# Patient Record
Sex: Female | Born: 1945 | Race: White | Hispanic: No | Marital: Married | Smoking: Former smoker
Health system: Southern US, Community
[De-identification: ages and names within clinical notes are randomized; demographics above are authoritative.]

## PROBLEM LIST (undated history)

## (undated) DIAGNOSIS — D649 Anemia, unspecified: Secondary | ICD-10-CM

## (undated) DIAGNOSIS — N840 Polyp of corpus uteri: Secondary | ICD-10-CM

## (undated) DIAGNOSIS — C801 Malignant (primary) neoplasm, unspecified: Secondary | ICD-10-CM

## (undated) DIAGNOSIS — I10 Essential (primary) hypertension: Secondary | ICD-10-CM

## (undated) DIAGNOSIS — C9200 Acute myeloblastic leukemia, not having achieved remission: Secondary | ICD-10-CM

## (undated) DIAGNOSIS — H269 Unspecified cataract: Secondary | ICD-10-CM

## (undated) DIAGNOSIS — T7840XA Allergy, unspecified, initial encounter: Secondary | ICD-10-CM

## (undated) DIAGNOSIS — M1990 Unspecified osteoarthritis, unspecified site: Secondary | ICD-10-CM

## (undated) DIAGNOSIS — M810 Age-related osteoporosis without current pathological fracture: Secondary | ICD-10-CM

## (undated) DIAGNOSIS — E785 Hyperlipidemia, unspecified: Secondary | ICD-10-CM

## (undated) HISTORY — PX: OTHER SURGICAL HISTORY: SHX169

## (undated) HISTORY — PX: DILATION AND CURETTAGE OF UTERUS: SHX78

## (undated) HISTORY — DX: Essential (primary) hypertension: I10

## (undated) HISTORY — DX: Unspecified osteoarthritis, unspecified site: M19.90

## (undated) HISTORY — DX: Anemia, unspecified: D64.9

## (undated) HISTORY — DX: Malignant (primary) neoplasm, unspecified: C80.1

## (undated) HISTORY — DX: Acute myeloblastic leukemia, not having achieved remission: C92.00

## (undated) HISTORY — PX: TONSILECTOMY, ADENOIDECTOMY, BILATERAL MYRINGOTOMY AND TUBES: SHX2538

## (undated) HISTORY — DX: Age-related osteoporosis without current pathological fracture: M81.0

## (undated) HISTORY — DX: Hyperlipidemia, unspecified: E78.5

## (undated) HISTORY — DX: Allergy, unspecified, initial encounter: T78.40XA

## (undated) HISTORY — DX: Polyp of corpus uteri: N84.0

## (undated) HISTORY — DX: Unspecified cataract: H26.9

---

## 2002-10-20 ENCOUNTER — Other Ambulatory Visit: Admission: RE | Admit: 2002-10-20 | Discharge: 2002-10-20 | Payer: Self-pay | Admitting: Family Medicine

## 2002-10-24 ENCOUNTER — Encounter: Payer: Self-pay | Admitting: Family Medicine

## 2002-10-24 ENCOUNTER — Encounter: Admission: RE | Admit: 2002-10-24 | Discharge: 2002-10-24 | Payer: Self-pay | Admitting: Family Medicine

## 2004-03-22 ENCOUNTER — Other Ambulatory Visit: Admission: RE | Admit: 2004-03-22 | Discharge: 2004-03-22 | Payer: Self-pay | Admitting: Family Medicine

## 2004-04-05 ENCOUNTER — Encounter: Admission: RE | Admit: 2004-04-05 | Discharge: 2004-04-05 | Payer: Self-pay | Admitting: Family Medicine

## 2004-09-13 ENCOUNTER — Ambulatory Visit: Payer: Self-pay | Admitting: Family Medicine

## 2004-12-30 ENCOUNTER — Ambulatory Visit: Payer: Self-pay | Admitting: Internal Medicine

## 2005-01-12 ENCOUNTER — Encounter: Admission: RE | Admit: 2005-01-12 | Discharge: 2005-01-12 | Payer: Self-pay | Admitting: Family Medicine

## 2005-02-12 ENCOUNTER — Ambulatory Visit (HOSPITAL_COMMUNITY): Admission: RE | Admit: 2005-02-12 | Discharge: 2005-02-13 | Payer: Self-pay | Admitting: Neurosurgery

## 2005-03-14 ENCOUNTER — Ambulatory Visit: Payer: Self-pay | Admitting: Family Medicine

## 2005-03-24 ENCOUNTER — Encounter: Payer: Self-pay | Admitting: Family Medicine

## 2005-03-24 ENCOUNTER — Other Ambulatory Visit: Admission: RE | Admit: 2005-03-24 | Discharge: 2005-03-24 | Payer: Self-pay | Admitting: Family Medicine

## 2005-03-24 ENCOUNTER — Ambulatory Visit: Payer: Self-pay | Admitting: Family Medicine

## 2005-04-11 ENCOUNTER — Encounter: Admission: RE | Admit: 2005-04-11 | Discharge: 2005-04-11 | Payer: Self-pay | Admitting: Family Medicine

## 2005-10-09 ENCOUNTER — Ambulatory Visit: Payer: Self-pay | Admitting: Family Medicine

## 2005-10-17 ENCOUNTER — Ambulatory Visit: Payer: Self-pay | Admitting: Family Medicine

## 2006-03-19 ENCOUNTER — Ambulatory Visit: Payer: Self-pay | Admitting: Family Medicine

## 2006-03-30 ENCOUNTER — Ambulatory Visit: Payer: Self-pay | Admitting: Family Medicine

## 2006-03-30 ENCOUNTER — Other Ambulatory Visit: Admission: RE | Admit: 2006-03-30 | Discharge: 2006-03-30 | Payer: Self-pay | Admitting: Family Medicine

## 2006-04-14 ENCOUNTER — Encounter: Admission: RE | Admit: 2006-04-14 | Discharge: 2006-04-14 | Payer: Self-pay | Admitting: Family Medicine

## 2006-05-05 ENCOUNTER — Encounter: Admission: RE | Admit: 2006-05-05 | Discharge: 2006-05-05 | Payer: Self-pay | Admitting: Family Medicine

## 2006-07-27 ENCOUNTER — Ambulatory Visit: Payer: Self-pay | Admitting: Family Medicine

## 2006-09-21 ENCOUNTER — Ambulatory Visit: Payer: Self-pay | Admitting: Family Medicine

## 2006-09-21 LAB — CONVERTED CEMR LAB
ALT: 23 units/L (ref 0–40)
AST: 20 units/L (ref 0–37)
Albumin: 3.8 g/dL (ref 3.5–5.2)
Alkaline Phosphatase: 49 units/L (ref 39–117)
Bilirubin, Direct: 0.2 mg/dL (ref 0.0–0.3)
Cholesterol: 184 mg/dL (ref 0–200)
HDL: 59.7 mg/dL (ref >39.0)
LDL Cholesterol: 105 mg/dL — ABNORMAL HIGH (ref 0–99)
TSH: 1.91 microintl units/mL (ref 0.35–5.50)
Total Bilirubin: 1 mg/dL (ref 0.3–1.2)
Total CHOL/HDL Ratio: 3.1
Total Protein: 6.7 g/dL (ref 6.0–8.3)
Triglycerides: 99 mg/dL (ref 0–149)
VLDL: 20 mg/dL (ref 0–40)

## 2006-09-30 ENCOUNTER — Ambulatory Visit: Payer: Self-pay | Admitting: Family Medicine

## 2006-11-04 ENCOUNTER — Encounter: Admission: RE | Admit: 2006-11-04 | Discharge: 2006-11-04 | Payer: Self-pay | Admitting: Family Medicine

## 2006-11-12 ENCOUNTER — Ambulatory Visit: Payer: Self-pay | Admitting: Family Medicine

## 2007-03-26 ENCOUNTER — Ambulatory Visit: Payer: Self-pay | Admitting: Family Medicine

## 2007-03-26 LAB — CONVERTED CEMR LAB
Bilirubin Urine: NEGATIVE
Blood in Urine, dipstick: NEGATIVE
Glucose, Urine, Semiquant: NEGATIVE
Ketones, urine, test strip: NEGATIVE
Nitrite: NEGATIVE
Protein, U semiquant: NEGATIVE
Specific Gravity, Urine: 1.02
Urobilinogen, UA: 0.2
WBC Urine, dipstick: NEGATIVE
pH: 6

## 2007-03-30 LAB — CONVERTED CEMR LAB
ALT: 26 units/L (ref 0–35)
AST: 18 units/L (ref 0–37)
Albumin: 3.6 g/dL (ref 3.5–5.2)
Alkaline Phosphatase: 55 units/L (ref 39–117)
BUN: 12 mg/dL (ref 6–23)
Basophils Absolute: 0 10*3/uL (ref 0.0–0.1)
Basophils Relative: 0.7 % (ref 0.0–1.0)
Bilirubin, Direct: 0.1 mg/dL (ref 0.0–0.3)
CO2: 31 meq/L (ref 19–32)
Calcium: 9.2 mg/dL (ref 8.4–10.5)
Chloride: 107 meq/L (ref 96–112)
Cholesterol: 180 mg/dL (ref 0–200)
Creatinine, Ser: 0.8 mg/dL (ref 0.4–1.2)
Eosinophils Absolute: 0.2 10*3/uL (ref 0.0–0.6)
Eosinophils Relative: 2.4 % (ref 0.0–5.0)
GFR calc Af Amer: 93 mL/min
GFR calc non Af Amer: 77 mL/min
Glucose, Bld: 93 mg/dL (ref 70–99)
HCT: 36.2 % (ref 36.0–46.0)
HDL: 58.8 mg/dL (ref >39.0)
Hemoglobin: 12.3 g/dL (ref 12.0–15.0)
LDL Cholesterol: 98 mg/dL (ref 0–99)
Lymphocytes Relative: 27.7 % (ref 12.0–46.0)
MCHC: 34 g/dL (ref 30.0–36.0)
MCV: 82.1 fL (ref 78.0–100.0)
Monocytes Absolute: 0.6 10*3/uL (ref 0.2–0.7)
Monocytes Relative: 8.3 % (ref 3.0–11.0)
Neutro Abs: 4.1 10*3/uL (ref 1.4–7.7)
Neutrophils Relative %: 60.9 % (ref 43.0–77.0)
Platelets: 174 10*3/uL (ref 150–400)
Potassium: 4.3 meq/L (ref 3.5–5.1)
RBC: 4.41 M/uL (ref 3.87–5.11)
RDW: 12.1 % (ref 11.5–14.6)
Sodium: 146 meq/L — ABNORMAL HIGH (ref 135–145)
TSH: 2.39 microintl units/mL (ref 0.35–5.50)
Total Bilirubin: 1.2 mg/dL (ref 0.3–1.2)
Total CHOL/HDL Ratio: 3.1
Total Protein: 6.6 g/dL (ref 6.0–8.3)
Triglycerides: 117 mg/dL (ref 0–149)
VLDL: 23 mg/dL (ref 0–40)
WBC: 6.8 10*3/uL (ref 4.5–10.5)

## 2007-04-06 ENCOUNTER — Encounter: Payer: Self-pay | Admitting: Family Medicine

## 2007-04-06 ENCOUNTER — Other Ambulatory Visit: Admission: RE | Admit: 2007-04-06 | Discharge: 2007-04-06 | Payer: Self-pay | Admitting: Family Medicine

## 2007-04-06 ENCOUNTER — Telehealth: Payer: Self-pay | Admitting: Family Medicine

## 2007-04-06 ENCOUNTER — Ambulatory Visit: Payer: Self-pay | Admitting: Family Medicine

## 2007-04-06 DIAGNOSIS — D649 Anemia, unspecified: Secondary | ICD-10-CM

## 2007-04-06 DIAGNOSIS — E039 Hypothyroidism, unspecified: Secondary | ICD-10-CM | POA: Insufficient documentation

## 2007-04-06 DIAGNOSIS — J309 Allergic rhinitis, unspecified: Secondary | ICD-10-CM | POA: Insufficient documentation

## 2007-04-06 DIAGNOSIS — E785 Hyperlipidemia, unspecified: Secondary | ICD-10-CM | POA: Insufficient documentation

## 2007-04-06 DIAGNOSIS — Z87442 Personal history of urinary calculi: Secondary | ICD-10-CM | POA: Insufficient documentation

## 2007-04-06 DIAGNOSIS — M1990 Unspecified osteoarthritis, unspecified site: Secondary | ICD-10-CM | POA: Insufficient documentation

## 2007-04-09 ENCOUNTER — Telehealth: Payer: Self-pay | Admitting: Family Medicine

## 2007-05-03 ENCOUNTER — Encounter: Admission: RE | Admit: 2007-05-03 | Discharge: 2007-05-03 | Payer: Self-pay | Admitting: Family Medicine

## 2007-09-23 ENCOUNTER — Ambulatory Visit: Payer: Self-pay | Admitting: Family Medicine

## 2007-09-24 LAB — CONVERTED CEMR LAB
ALT: 24 units/L (ref 0–35)
AST: 19 units/L (ref 0–37)
Albumin: 3.6 g/dL (ref 3.5–5.2)
Alkaline Phosphatase: 48 units/L (ref 39–117)
Bilirubin, Direct: 0.1 mg/dL (ref 0.0–0.3)
Cholesterol: 174 mg/dL (ref 0–200)
HDL: 58.6 mg/dL (ref >39.0)
LDL Cholesterol: 96 mg/dL (ref 0–99)
Total Bilirubin: 0.8 mg/dL (ref 0.3–1.2)
Total CHOL/HDL Ratio: 3
Total Protein: 6.7 g/dL (ref 6.0–8.3)
Triglycerides: 99 mg/dL (ref 0–149)
VLDL: 20 mg/dL (ref 0–40)

## 2008-01-24 ENCOUNTER — Telehealth: Payer: Self-pay | Admitting: Family Medicine

## 2008-01-31 ENCOUNTER — Ambulatory Visit: Payer: Self-pay | Admitting: Gastroenterology

## 2008-02-02 ENCOUNTER — Telehealth: Payer: Self-pay | Admitting: Gastroenterology

## 2008-02-14 ENCOUNTER — Ambulatory Visit: Payer: Self-pay | Admitting: Gastroenterology

## 2008-02-14 ENCOUNTER — Encounter: Payer: Self-pay | Admitting: Gastroenterology

## 2008-02-16 ENCOUNTER — Encounter: Payer: Self-pay | Admitting: Gastroenterology

## 2008-04-10 ENCOUNTER — Ambulatory Visit: Payer: Self-pay | Admitting: Family Medicine

## 2008-04-12 LAB — CONVERTED CEMR LAB
ALT: 26 units/L (ref 0–35)
AST: 20 units/L (ref 0–37)
Albumin: 3.6 g/dL (ref 3.5–5.2)
Alkaline Phosphatase: 50 units/L (ref 39–117)
BUN: 16 mg/dL (ref 6–23)
Basophils Absolute: 0 10*3/uL (ref 0.0–0.1)
Basophils Relative: 0.4 % (ref 0.0–3.0)
Bilirubin, Direct: 0.1 mg/dL (ref 0.0–0.3)
CO2: 30 meq/L (ref 19–32)
Calcium: 9 mg/dL (ref 8.4–10.5)
Chloride: 107 meq/L (ref 96–112)
Cholesterol: 155 mg/dL (ref 0–200)
Creatinine, Ser: 0.8 mg/dL (ref 0.4–1.2)
Eosinophils Absolute: 0.2 10*3/uL (ref 0.0–0.7)
Eosinophils Relative: 2.7 % (ref 0.0–5.0)
GFR calc Af Amer: 93 mL/min
GFR calc non Af Amer: 77 mL/min
Glucose, Bld: 103 mg/dL — ABNORMAL HIGH (ref 70–99)
HCT: 38.6 % (ref 36.0–46.0)
HDL: 56.7 mg/dL (ref >39.0)
Hemoglobin: 12.9 g/dL (ref 12.0–15.0)
LDL Cholesterol: 80 mg/dL (ref 0–99)
Lymphocytes Relative: 28.2 % (ref 12.0–46.0)
MCHC: 33.5 g/dL (ref 30.0–36.0)
MCV: 83.6 fL (ref 78.0–100.0)
Monocytes Absolute: 0.4 10*3/uL (ref 0.1–1.0)
Monocytes Relative: 6.7 % (ref 3.0–12.0)
Neutro Abs: 4.1 10*3/uL (ref 1.4–7.7)
Neutrophils Relative %: 62 % (ref 43.0–77.0)
Platelets: 179 10*3/uL (ref 150–400)
Potassium: 4.4 meq/L (ref 3.5–5.1)
RBC: 4.62 M/uL (ref 3.87–5.11)
RDW: 11.8 % (ref 11.5–14.6)
Sodium: 144 meq/L (ref 135–145)
TSH: 2.67 microintl units/mL (ref 0.35–5.50)
Total Bilirubin: 0.9 mg/dL (ref 0.3–1.2)
Total CHOL/HDL Ratio: 2.7
Total Protein: 6.9 g/dL (ref 6.0–8.3)
Triglycerides: 92 mg/dL (ref 0–149)
VLDL: 18 mg/dL (ref 0–40)
WBC: 6.5 10*3/uL (ref 4.5–10.5)

## 2008-04-17 ENCOUNTER — Ambulatory Visit: Payer: Self-pay | Admitting: Family Medicine

## 2008-04-17 DIAGNOSIS — M810 Age-related osteoporosis without current pathological fracture: Secondary | ICD-10-CM | POA: Insufficient documentation

## 2008-05-03 ENCOUNTER — Encounter: Admission: RE | Admit: 2008-05-03 | Discharge: 2008-05-03 | Payer: Self-pay | Admitting: Family Medicine

## 2008-08-15 ENCOUNTER — Encounter: Payer: Self-pay | Admitting: Family Medicine

## 2008-08-15 ENCOUNTER — Ambulatory Visit: Payer: Self-pay | Admitting: Internal Medicine

## 2008-08-29 ENCOUNTER — Telehealth: Payer: Self-pay | Admitting: Family Medicine

## 2008-10-17 ENCOUNTER — Ambulatory Visit: Payer: Self-pay | Admitting: Family Medicine

## 2008-10-18 ENCOUNTER — Encounter: Payer: Self-pay | Admitting: Family Medicine

## 2008-10-18 LAB — CONVERTED CEMR LAB
ALT: 25 units/L (ref 0–35)
AST: 22 units/L (ref 0–37)
Albumin: 3.5 g/dL (ref 3.5–5.2)
Alkaline Phosphatase: 50 units/L (ref 39–117)
Bilirubin, Direct: 0.1 mg/dL (ref 0.0–0.3)
Cholesterol: 170 mg/dL (ref 0–200)
HDL: 51.8 mg/dL (ref >39.00)
LDL Cholesterol: 96 mg/dL (ref 0–99)
Total Bilirubin: 0.9 mg/dL (ref 0.3–1.2)
Total CHOL/HDL Ratio: 3
Total Protein: 6.6 g/dL (ref 6.0–8.3)
Triglycerides: 112 mg/dL (ref 0.0–149.0)
VLDL: 22.4 mg/dL (ref 0.0–40.0)

## 2008-10-24 ENCOUNTER — Ambulatory Visit: Payer: Self-pay | Admitting: Family Medicine

## 2009-02-02 ENCOUNTER — Ambulatory Visit: Payer: Self-pay | Admitting: Family Medicine

## 2009-02-02 DIAGNOSIS — N950 Postmenopausal bleeding: Secondary | ICD-10-CM

## 2009-02-06 ENCOUNTER — Encounter: Payer: Self-pay | Admitting: Family Medicine

## 2009-03-08 ENCOUNTER — Encounter: Payer: Self-pay | Admitting: Family Medicine

## 2009-03-08 ENCOUNTER — Encounter (INDEPENDENT_AMBULATORY_CARE_PROVIDER_SITE_OTHER): Payer: Self-pay | Admitting: Obstetrics and Gynecology

## 2009-03-08 ENCOUNTER — Ambulatory Visit (HOSPITAL_BASED_OUTPATIENT_CLINIC_OR_DEPARTMENT_OTHER): Admission: RE | Admit: 2009-03-08 | Discharge: 2009-03-08 | Payer: Self-pay | Admitting: Obstetrics and Gynecology

## 2009-04-03 ENCOUNTER — Ambulatory Visit: Payer: Self-pay | Admitting: Family Medicine

## 2009-04-03 LAB — CONVERTED CEMR LAB
Bilirubin Urine: NEGATIVE
Blood in Urine, dipstick: NEGATIVE
Glucose, Urine, Semiquant: NEGATIVE
Ketones, urine, test strip: NEGATIVE
Nitrite: NEGATIVE
Protein, U semiquant: NEGATIVE
Specific Gravity, Urine: 1.02
Urobilinogen, UA: 0.2
WBC Urine, dipstick: NEGATIVE
pH: 5

## 2009-04-04 LAB — CONVERTED CEMR LAB
ALT: 25 units/L (ref 0–35)
AST: 20 units/L (ref 0–37)
Albumin: 3.6 g/dL (ref 3.5–5.2)
Alkaline Phosphatase: 56 units/L (ref 39–117)
BUN: 12 mg/dL (ref 6–23)
Basophils Absolute: 0 10*3/uL (ref 0.0–0.1)
Basophils Relative: 0.6 % (ref 0.0–3.0)
Bilirubin, Direct: 0 mg/dL (ref 0.0–0.3)
CO2: 30 meq/L (ref 19–32)
Calcium: 9 mg/dL (ref 8.4–10.5)
Chloride: 111 meq/L (ref 96–112)
Cholesterol: 164 mg/dL (ref 0–200)
Creatinine, Ser: 0.8 mg/dL (ref 0.4–1.2)
Eosinophils Absolute: 0.1 10*3/uL (ref 0.0–0.7)
Eosinophils Relative: 2 % (ref 0.0–5.0)
GFR calc non Af Amer: 76.52 mL/min (ref >60)
Glucose, Bld: 107 mg/dL — ABNORMAL HIGH (ref 70–99)
HCT: 38.5 % (ref 36.0–46.0)
HDL: 50.9 mg/dL (ref >39.00)
Hemoglobin: 12.7 g/dL (ref 12.0–15.0)
LDL Cholesterol: 89 mg/dL (ref 0–99)
Lymphocytes Relative: 27.9 % (ref 12.0–46.0)
Lymphs Abs: 1.7 10*3/uL (ref 0.7–4.0)
MCHC: 33 g/dL (ref 30.0–36.0)
MCV: 84.5 fL (ref 78.0–100.0)
Monocytes Absolute: 0.5 10*3/uL (ref 0.1–1.0)
Monocytes Relative: 8.6 % (ref 3.0–12.0)
Neutro Abs: 3.8 10*3/uL (ref 1.4–7.7)
Neutrophils Relative %: 60.9 % (ref 43.0–77.0)
Platelets: 162 10*3/uL (ref 150.0–400.0)
Potassium: 4.3 meq/L (ref 3.5–5.1)
RBC: 4.55 M/uL (ref 3.87–5.11)
RDW: 12.3 % (ref 11.5–14.6)
Sodium: 144 meq/L (ref 135–145)
TSH: 1.35 microintl units/mL (ref 0.35–5.50)
Total Bilirubin: 0.9 mg/dL (ref 0.3–1.2)
Total CHOL/HDL Ratio: 3
Total Protein: 6.7 g/dL (ref 6.0–8.3)
Triglycerides: 123 mg/dL (ref 0.0–149.0)
VLDL: 24.6 mg/dL (ref 0.0–40.0)
WBC: 6.1 10*3/uL (ref 4.5–10.5)

## 2009-04-10 ENCOUNTER — Ambulatory Visit: Payer: Self-pay | Admitting: Family Medicine

## 2009-05-04 ENCOUNTER — Encounter: Admission: RE | Admit: 2009-05-04 | Discharge: 2009-05-04 | Payer: Self-pay | Admitting: Family Medicine

## 2009-07-12 ENCOUNTER — Ambulatory Visit: Payer: Self-pay | Admitting: Family Medicine

## 2009-07-13 ENCOUNTER — Telehealth: Payer: Self-pay | Admitting: Family Medicine

## 2009-10-29 ENCOUNTER — Ambulatory Visit: Payer: Self-pay | Admitting: Family Medicine

## 2009-10-31 LAB — CONVERTED CEMR LAB
ALT: 22 units/L (ref 0–35)
AST: 17 units/L (ref 0–37)
Albumin: 3.5 g/dL (ref 3.5–5.2)
Alkaline Phosphatase: 47 units/L (ref 39–117)
Bilirubin, Direct: 0 mg/dL (ref 0.0–0.3)
Cholesterol: 149 mg/dL (ref 0–200)
HDL: 56.1 mg/dL (ref >39.00)
LDL Cholesterol: 73 mg/dL (ref 0–99)
Total Bilirubin: 0.5 mg/dL (ref 0.3–1.2)
Total CHOL/HDL Ratio: 3
Total Protein: 6.7 g/dL (ref 6.0–8.3)
Triglycerides: 99 mg/dL (ref 0.0–149.0)
VLDL: 19.8 mg/dL (ref 0.0–40.0)

## 2009-11-05 ENCOUNTER — Ambulatory Visit: Payer: Self-pay | Admitting: Family Medicine

## 2010-05-06 ENCOUNTER — Encounter: Admission: RE | Admit: 2010-05-06 | Discharge: 2010-05-06 | Payer: Self-pay | Admitting: Family Medicine

## 2010-05-20 ENCOUNTER — Ambulatory Visit: Payer: Self-pay | Admitting: Family Medicine

## 2010-05-20 ENCOUNTER — Encounter: Payer: Self-pay | Admitting: Family Medicine

## 2010-05-21 LAB — CONVERTED CEMR LAB
ALT: 25 units/L (ref 0–35)
AST: 20 units/L (ref 0–37)
Albumin: 3.8 g/dL (ref 3.5–5.2)
Alkaline Phosphatase: 52 units/L (ref 39–117)
BUN: 15 mg/dL (ref 6–23)
Basophils Absolute: 0 10*3/uL (ref 0.0–0.1)
Basophils Relative: 0.8 % (ref 0.0–3.0)
Bilirubin, Direct: 0.1 mg/dL (ref 0.0–0.3)
CO2: 29 meq/L (ref 19–32)
Calcium: 8.9 mg/dL (ref 8.4–10.5)
Chloride: 103 meq/L (ref 96–112)
Cholesterol: 198 mg/dL (ref 0–200)
Creatinine, Ser: 0.7 mg/dL (ref 0.4–1.2)
Eosinophils Absolute: 0.1 10*3/uL (ref 0.0–0.7)
Eosinophils Relative: 1.9 % (ref 0.0–5.0)
GFR calc non Af Amer: 83.43 mL/min (ref >60)
Glucose, Bld: 113 mg/dL — ABNORMAL HIGH (ref 70–99)
HCT: 37.6 % (ref 36.0–46.0)
HDL: 62.3 mg/dL (ref >39.00)
Hemoglobin: 12.8 g/dL (ref 12.0–15.0)
LDL Cholesterol: 112 mg/dL — ABNORMAL HIGH (ref 0–99)
Lymphocytes Relative: 25.1 % (ref 12.0–46.0)
Lymphs Abs: 1.4 10*3/uL (ref 0.7–4.0)
MCHC: 34 g/dL (ref 30.0–36.0)
MCV: 83.9 fL (ref 78.0–100.0)
Monocytes Absolute: 0.5 10*3/uL (ref 0.1–1.0)
Monocytes Relative: 8.5 % (ref 3.0–12.0)
Neutro Abs: 3.5 10*3/uL (ref 1.4–7.7)
Neutrophils Relative %: 63.7 % (ref 43.0–77.0)
Platelets: 164 10*3/uL (ref 150.0–400.0)
Potassium: 4.7 meq/L (ref 3.5–5.1)
RBC: 4.49 M/uL (ref 3.87–5.11)
RDW: 12.9 % (ref 11.5–14.6)
Sodium: 139 meq/L (ref 135–145)
TSH: 1.46 microintl units/mL (ref 0.35–5.50)
Total Bilirubin: 1.1 mg/dL (ref 0.3–1.2)
Total CHOL/HDL Ratio: 3
Total Protein: 6.6 g/dL (ref 6.0–8.3)
Triglycerides: 119 mg/dL (ref 0.0–149.0)
VLDL: 23.8 mg/dL (ref 0.0–40.0)
WBC: 5.5 10*3/uL (ref 4.5–10.5)

## 2010-07-27 ENCOUNTER — Encounter: Payer: Self-pay | Admitting: Family Medicine

## 2010-08-04 LAB — CONVERTED CEMR LAB: Pap Smear: NORMAL

## 2010-08-06 NOTE — Progress Notes (Signed)
Summary: labs needed?  Phone Note Call from Patient Call back at Work Phone [phone]   Caller: Patient-live call Summary of Call: patient was seen yesterday and was told to follow up in 3 months. She stated that she will need labs. Which ones? Initial call taken by: Warnell Forester,  July 13, 2009 9:40 AM  Follow-up for Phone Call        lipids and liver for 272.4 Follow-up by: Nelwyn Salisbury MD,  July 13, 2009 10:33 AM  Additional Follow-up for Phone Call Additional follow up Details #1::        patient is aware of above. Appt has been made. Additional Follow-up by: Warnell Forester,  July 13, 2009 2:07 PM

## 2010-08-06 NOTE — Assessment & Plan Note (Signed)
Summary: emp--will fast///ccm   Vital Signs:  Patient profile:   65 year old female Height:      60 inches Weight:      162 pounds O2 Sat:      94 % Temp:     98.2 degrees F Pulse rate:   96 / minute BP sitting:   150 / 84  (left arm)  Vitals Entered By: Pura Spice, RN (May 20, 2010 9:18 AM)  Contraindications/Deferment of Procedures/Staging:    Test/Procedure: TD vaccine    Reason for deferment: declined  CC: cpx fasting. Had Pap last yr with Dr Rosalio Macadamia.    History of Present Illness: 65 yr old female for a cpx. She feels well and has no concerns. After the first of the year she will go to part time on her job. She hopes to spend some time with her grandchildren.   Allergies: 1)  ! Penicillin 2)  ! Jonne Ply  Past History:  Past Medical History: Reviewed history from 04/10/2009 and no changes required. Allergic rhinitis Anemia-NOS Hyperlipidemia Hypothyroidism Osteoarthritis Osteoporosis, last DEXA 08-15-08 post-menopausal bleeding from a benign endometrial polyp  Past Surgical History: Reviewed history from 04/10/2009 and no changes required. Tonsillectomy Herniated disc L4-5 per Dr. Wynetta Emery colonoscopy 02-14-08 per Dr. Christella Hartigan, repeat 5 yrs endometrial polypectomy with D and C 03-08-09 per Dr. Rosalio Macadamia  Past History:  Care Management: Ophthalmology: Dr Elmer Picker Gastroenterology: [facility]   Family History: Reviewed history from 04/17/2008 and no changes required. Family History of Stroke M 1st degree relative <50  Social History: Reviewed history from 04/17/2008 and no changes required. Married Never Smoked Alcohol use-no Drug use-no  Review of Systems  The patient denies anorexia, fever, weight loss, weight gain, vision loss, decreased hearing, hoarseness, chest pain, syncope, dyspnea on exertion, peripheral edema, prolonged cough, headaches, hemoptysis, abdominal pain, melena, hematochezia, severe indigestion/heartburn, hematuria, incontinence,  genital sores, muscle weakness, suspicious skin lesions, transient blindness, difficulty walking, depression, unusual weight change, abnormal bleeding, enlarged lymph nodes, angioedema, breast masses, and testicular masses.    Physical Exam  General:  overweight-appearing.   Head:  Normocephalic and atraumatic without obvious abnormalities. No apparent alopecia or balding. Eyes:  No corneal or conjunctival inflammation noted. EOMI. Perrla. Funduscopic exam benign, without hemorrhages, exudates or papilledema. Vision grossly normal. Ears:  External ear exam shows no significant lesions or deformities.  Otoscopic examination reveals clear canals, tympanic membranes are intact bilaterally without bulging, retraction, inflammation or discharge. Hearing is grossly normal bilaterally. Nose:  External nasal examination shows no deformity or inflammation. Nasal mucosa are pink and moist without lesions or exudates. Mouth:  Oral mucosa and oropharynx without lesions or exudates.  Teeth in good repair. Neck:  No deformities, masses, or tenderness noted. Chest Wall:  No deformities, masses, or tenderness noted. Lungs:  Normal respiratory effort, chest expands symmetrically. Lungs are clear to auscultation, no crackles or wheezes. Heart:  Normal rate and regular rhythm. S1 and S2 normal without gallop, murmur, click, rub or other extra sounds. EKG normal with a few PACs  Abdomen:  Bowel sounds positive,abdomen soft and non-tender without masses, organomegaly or hernias noted. Msk:  No deformity or scoliosis noted of thoracic or lumbar spine.   Pulses:  R and L carotid,radial,femoral,dorsalis pedis and posterior tibial pulses are full and equal bilaterally Extremities:  No clubbing, cyanosis, edema, or deformity noted with normal full range of motion of all joints.   Neurologic:  No cranial nerve deficits noted. Station and gait are normal. Plantar  reflexes are down-going bilaterally. DTRs are symmetrical  throughout. Sensory, motor and coordinative functions appear intact. Skin:  Intact without suspicious lesions or rashes Cervical Nodes:  No lymphadenopathy noted Axillary Nodes:  No palpable lymphadenopathy Inguinal Nodes:  No significant adenopathy Psych:  Cognition and judgment appear intact. Alert and cooperative with normal attention span and concentration. No apparent delusions, illusions, hallucinations   Impression & Recommendations:  Problem # 1:  EXAMINATION, ROUTINE MEDICAL (ICD-V70.0)  Orders: UA Dipstick w/o Micro (automated)  (81003) EKG w/ Interpretation (93000) Venipuncture (64403) TLB-Lipid Panel (80061-LIPID) TLB-BMP (Basic Metabolic Panel-BMET) (80048-METABOL) TLB-CBC Platelet - w/Differential (85025-CBCD) TLB-Hepatic/Liver Function Pnl (80076-HEPATIC) TLB-TSH (Thyroid Stimulating Hormone) (84443-TSH)  Complete Medication List: 1)  Nabumetone 750 Mg Tabs (Nabumetone) .Marland Kitchen.. 1 by mouth two times a day 2)  Levoxyl 50 Mcg Tabs (Levothyroxine sodium) .Marland Kitchen.. 1 by mouth once daily 3)  Fosamax 35 Mg Tabs (Alendronate sodium) .Marland Kitchen.. 1 by mouth every week 4)  Lipitor 20 Mg Tabs (Atorvastatin calcium) .Marland Kitchen.. 1 by mouth once daily 5)  Multivitamins Tabs (Multiple vitamin) .Marland Kitchen.. 1 by mouth once daily  Other Orders: Pneumococcal Vaccine (47425) Admin 1st Vaccine (95638)  Patient Instructions: 1)  It is important that you exercise reguarly at least 20 minutes 5 times a week. If you develop chest pain, have severe difficulty breathing, or feel very tired, stop exercising immediately and seek medical attention.  2)  You need to lose weight. Consider a lower calorie diet and regular exercise.  3)  get fasting labs today Prescriptions: LIPITOR 20 MG  TABS (ATORVASTATIN CALCIUM) 1 by mouth once daily  #90 x 3   Entered and Authorized by:   Nelwyn Salisbury MD   Signed by:   Nelwyn Salisbury MD on 05/20/2010   Method used:   Electronically to        [facility]*  (retail)       [address] [place], [place]  [address]       Ph: [phone]       Fax: [phone]   RxID:   [id number] LEVOXYL 50 MCG TABS (LEVOTHYROXINE SODIUM) 1 by mouth once daily  #90 x 3   Entered and Authorized by:   Nelwyn Salisbury MD   Signed by:   Nelwyn Salisbury MD on 05/20/2010   Method used:   Electronically to        [facility]* (retail)       [address] [place], [place]  [address]       Ph: [phone]       Fax: [phone]   RxID:   [id number] NABUMETONE 750 MG  TABS (NABUMETONE) 1 by mouth two times a day  #180 x 3   Entered and Authorized by:   Nelwyn Salisbury MD   Signed by:   Nelwyn Salisbury MD on 05/20/2010   Method used:   Electronically to        [facility]* (retail)       [address] [place], [place]  [address]       Ph: [phone]       Fax: [phone]   RxID:   [id number]    Orders Added:  1)  Pneumococcal Vaccine [90732] 2)  Admin 1st Vaccine [90471] 3)  Est. Patient 65& > [99397] 4)  UA Dipstick w/o Micro (automated)  [81003] 5)  EKG w/ Interpretation [93000] 6)  Venipuncture [36415] 7)  TLB-Lipid Panel [80061-LIPID] 8)  TLB-BMP (Basic Metabolic Panel-BMET) [80048-METABOL] 9)  TLB-CBC Platelet - w/Differential [85025-CBCD] 10)  TLB-Hepatic/Liver Function Pnl [80076-HEPATIC] 11)  TLB-TSH (Thyroid Stimulating Hormone) [45409-WJX]   Immunizations Administered:  Pneumonia Vaccine:    Vaccine Type: Pneumovax    Site: left deltoid    Mfr: Merck    Dose: 0.5 ml    Route: IM    Given by: Pura Spice, RN    Exp. Date: 10/29/2011    Lot #: [id number]    VIS given: 06/11/09 version given May 20, 2010.   Immunizations Administered:  Pneumonia Vaccine:    Vaccine Type: Pneumovax    Site: left deltoid    Mfr: Merck    Dose: 0.5 ml    Route: IM    Given by: Pura Spice, RN     Exp. Date: 10/29/2011    Lot #: [id number]    VIS given: 06/11/09 version given May 20, 2010.  Appended Document: Orders Update    Clinical Lists Changes  Orders: Added new Service order of Specimen Handling (91478) - Signed      Appended Document: emp--will fast///ccm  Laboratory Results   Urine Tests    Routine Urinalysis   Color: yellow Appearance: Clear Glucose: negative   (Normal Range: Negative) Bilirubin: negative   (Normal Range: Negative) Ketone: negative   (Normal Range: Negative) Spec. Gravity: 1.025   (Normal Range: 1.003-1.035) Blood: negative   (Normal Range: Negative) pH: 5.0   (Normal Range: 5.0-8.0) Protein: negative   (Normal Range: Negative) Urobilinogen: 0.2   (Normal Range: 0-1) Nitrite: negative   (Normal Range: Negative) Leukocyte Esterace: negative   (Normal Range: Negative)    Comments: Rita Ohara  May 20, 2010 2:06 PM

## 2010-08-06 NOTE — Assessment & Plan Note (Signed)
Summary: 3 month fup//ccm   Vital Signs:  Patient profile:   65 year old female Weight:      159 pounds BMI:     30.65 BP sitting:   138 / 68  (left arm) Cuff size:   regular  Vitals Entered By: Raechel Ache, RN (Nov 05, 2009 3:31 PM) CC: 3 mo ROV, c/o sinus headaches and frequent sinus infections.   History of Present Illness: Here to follow up on hyperlipidemia and a hx of elevated BPs.  She recently had a lipid panel showing excellent results. She feels good with no concerns.   Allergies: 1)  ! Penicillin 2)  ! Jonne Ply  Past History:  Past Medical History: Reviewed history from 04/10/2009 and no changes required. Allergic rhinitis Anemia-NOS Hyperlipidemia Hypothyroidism Osteoarthritis Osteoporosis, last DEXA 08-15-08 post-menopausal bleeding from a benign endometrial polyp  Review of Systems  The patient denies anorexia, fever, weight loss, weight gain, vision loss, decreased hearing, hoarseness, chest pain, syncope, dyspnea on exertion, peripheral edema, prolonged cough, headaches, hemoptysis, abdominal pain, melena, hematochezia, severe indigestion/heartburn, hematuria, incontinence, genital sores, muscle weakness, suspicious skin lesions, transient blindness, difficulty walking, depression, unusual weight change, abnormal bleeding, enlarged lymph nodes, angioedema, breast masses, and testicular masses.    Physical Exam  General:  Well-developed,well-nourished,in no acute distress; alert,appropriate and cooperative throughout examination Neck:  No deformities, masses, or tenderness noted. Lungs:  Normal respiratory effort, chest expands symmetrically. Lungs are clear to auscultation, no crackles or wheezes. Heart:  Normal rate and regular rhythm. S1 and S2 normal without gallop, murmur, click, rub or other extra sounds.   Impression & Recommendations:  Problem # 1:  ELEVATED BLOOD PRESSURE (ICD-796.2)  Problem # 2:  HYPERLIPIDEMIA (ICD-272.4)  Her updated  medication list for this problem includes:    Lipitor 20 Mg Tabs (Atorvastatin calcium) .Marland Kitchen... 1 by mouth once daily  Complete Medication List: 1)  Nabumetone 750 Mg Tabs (Nabumetone) .Marland Kitchen.. 1 by mouth two times a day 2)  Levoxyl 50 Mcg Tabs (Levothyroxine sodium) .Marland Kitchen.. 1 by mouth once daily 3)  Fosamax 35 Mg Tabs (Alendronate sodium) .Marland Kitchen.. 1 by mouth every week 4)  Lipitor 20 Mg Tabs (Atorvastatin calcium) .Marland Kitchen.. 1 by mouth once daily 5)  Multivitamins Tabs (Multiple vitamin) .Marland Kitchen.. 1 by mouth once daily  Patient Instructions: 1)  Please schedule a follow-up appointment in 6 months .

## 2010-08-06 NOTE — Assessment & Plan Note (Signed)
Summary: ROA/BP/FUP/RCD   Vital Signs:  Patient profile:   65 year old female Weight:      159 pounds Temp:     98.1 degrees F oral Pulse rate:   112 / minute BP sitting:   142 / 76  (left arm) Cuff size:   regular  Vitals Entered By: Alfred Levins, CMA (July 12, 2009 8:27 AM) CC: bp check   History of Present Illness: Here to recheck her BP after it was high at her cpx 3 months ago. She has joined [employer], and has lost 10 lbs. She is walking for exercise again. She feels fine.  Current Medications (verified): 1)  Nabumetone 750 Mg  Tabs (Nabumetone) .Marland Kitchen.. 1 By Mouth Two Times A Day 2)  Levoxyl 50 Mcg Tabs (Levothyroxine Sodium) .Marland Kitchen.. 1 By Mouth Once Daily 3)  Fosamax 35 Mg  Tabs (Alendronate Sodium) .Marland Kitchen.. 1 By Mouth Every Week 4)  Lipitor 20 Mg  Tabs (Atorvastatin Calcium) .Marland Kitchen.. 1 By Mouth Once Daily 5)  Multivitamins   Tabs (Multiple Vitamin) .Marland Kitchen.. 1 By Mouth Once Daily  Allergies (verified): 1)  ! Penicillin 2)  ! Jonne Ply  Past History:  Past Medical History: Reviewed history from 04/10/2009 and no changes required. Allergic rhinitis Anemia-NOS Hyperlipidemia Hypothyroidism Osteoarthritis Osteoporosis, last DEXA 08-15-08 post-menopausal bleeding from a benign endometrial polyp  Review of Systems  The patient denies anorexia, fever, weight gain, vision loss, decreased hearing, hoarseness, chest pain, syncope, dyspnea on exertion, peripheral edema, prolonged cough, headaches, hemoptysis, abdominal pain, melena, hematochezia, severe indigestion/heartburn, hematuria, incontinence, genital sores, muscle weakness, suspicious skin lesions, transient blindness, difficulty walking, depression, unusual weight change, abnormal bleeding, enlarged lymph nodes, angioedema, breast masses, and testicular masses.    Physical Exam  General:  Well-developed,well-nourished,in no acute distress; alert,appropriate and cooperative throughout examination Lungs:  Normal respiratory effort,  chest expands symmetrically. Lungs are clear to auscultation, no crackles or wheezes. Heart:  Normal rate and regular rhythm. S1 and S2 normal without gallop, murmur, click, rub or other extra sounds.   Impression & Recommendations:  Problem # 1:  ELEVATED BLOOD PRESSURE (ICD-796.2)  Complete Medication List: 1)  Nabumetone 750 Mg Tabs (Nabumetone) .Marland Kitchen.. 1 by mouth two times a day 2)  Levoxyl 50 Mcg Tabs (Levothyroxine sodium) .Marland Kitchen.. 1 by mouth once daily 3)  Fosamax 35 Mg Tabs (Alendronate sodium) .Marland Kitchen.. 1 by mouth every week 4)  Lipitor 20 Mg Tabs (Atorvastatin calcium) .Marland Kitchen.. 1 by mouth once daily 5)  Multivitamins Tabs (Multiple vitamin) .Marland Kitchen.. 1 by mouth once daily  Patient Instructions: 1)  Please schedule a follow-up appointment in 3 months . Continue diet and exercise

## 2010-09-19 ENCOUNTER — Other Ambulatory Visit: Payer: Self-pay

## 2010-09-23 ENCOUNTER — Telehealth: Payer: Self-pay

## 2010-09-23 DIAGNOSIS — M8580 Other specified disorders of bone density and structure, unspecified site: Secondary | ICD-10-CM

## 2010-09-23 NOTE — Telephone Encounter (Signed)
Order sent to [facility]

## 2010-09-24 ENCOUNTER — Ambulatory Visit (INDEPENDENT_AMBULATORY_CARE_PROVIDER_SITE_OTHER)
Admission: RE | Admit: 2010-09-24 | Discharge: 2010-09-24 | Disposition: A | Discharge status: Home / Self Care | Payer: Self-pay | Source: Ambulatory Visit | Attending: Family Medicine | Admitting: Family Medicine

## 2010-09-24 DIAGNOSIS — M8580 Other specified disorders of bone density and structure, unspecified site: Secondary | ICD-10-CM

## 2010-09-24 DIAGNOSIS — M899 Disorder of bone, unspecified: Secondary | ICD-10-CM

## 2010-10-10 ENCOUNTER — Encounter: Payer: Self-pay | Admitting: Family Medicine

## 2010-10-11 LAB — POCT HEMOGLOBIN-HEMACUE: Hemoglobin: 13.1 g/dL (ref 12.0–15.0)

## 2010-10-28 ENCOUNTER — Telehealth: Payer: Self-pay | Admitting: Family Medicine

## 2010-10-28 MED ORDER — ALENDRONATE SODIUM 35 MG PO TABS: 35.0000 mg | ORAL_TABLET | ORAL | Status: DC

## 2010-10-28 NOTE — Telephone Encounter (Signed)
Rx Done . 

## 2010-11-18 ENCOUNTER — Encounter: Payer: Self-pay | Admitting: Family Medicine

## 2010-11-19 ENCOUNTER — Encounter: Payer: Self-pay | Admitting: Family Medicine

## 2010-11-19 ENCOUNTER — Ambulatory Visit (INDEPENDENT_AMBULATORY_CARE_PROVIDER_SITE_OTHER): Payer: 59 | Admitting: Family Medicine

## 2010-11-19 VITALS — BP 166/88 | HR 79 | Temp 98.2°F | Resp 14 | Wt 165.0 lb

## 2010-11-19 DIAGNOSIS — E785 Hyperlipidemia, unspecified: Secondary | ICD-10-CM

## 2010-11-19 DIAGNOSIS — E039 Hypothyroidism, unspecified: Secondary | ICD-10-CM

## 2010-11-19 LAB — HEPATIC FUNCTION PANEL
ALT: 20 U/L (ref 0–35)
Bilirubin, Direct: 0.1 mg/dL (ref 0.0–0.3)
Total Bilirubin: 0.8 mg/dL (ref 0.3–1.2)

## 2010-11-19 LAB — LIPID PANEL
LDL Cholesterol: 93 mg/dL (ref 0–99)
VLDL: 26 mg/dL (ref 0.0–40.0)

## 2010-11-19 NOTE — Progress Notes (Signed)
  Subjective:    Patient ID: Miranda Mcguire, female    DOB: 1945-10-20, 65 y.o.   MRN: [id number]  HPI Here for follow up and labs. She feels great and has no concerns.    Review of Systems  Constitutional: Negative.   Respiratory: Negative.   Cardiovascular: Negative.        Objective:   Physical Exam  Constitutional: She appears well-developed and well-nourished.  Neck: No thyromegaly present.  Cardiovascular: Normal rate, regular rhythm, normal heart sounds and intact distal pulses.   Pulmonary/Chest: Effort normal and breath sounds normal.  Lymphadenopathy:    She has no cervical adenopathy.          Assessment & Plan:  Get labs

## 2010-11-19 NOTE — Op Note (Signed)
Miranda Mcguire, Miranda Mcguire            ACCOUNT NO.:  [id number]   MEDICAL RECORD NO.:  [id number]          PATIENT TYPE:  AMB   LOCATION:  [facility]                         FACILITY:  [facility]   PHYSICIAN:  Sherry A. Dickstein, M.D.DATE OF BIRTH:  Nov 14, 1945   DATE OF PROCEDURE:  DATE OF DISCHARGE:                               OPERATIVE REPORT   PREOPERATIVE DIAGNOSIS:  Endometrial polyp, postmenopausal bleeding.   POSTOPERATIVE DIAGNOSIS:  Endometrial polyp, postmenopausal bleeding.   PROCEDURE:  D and C, hysteroscopy with resectoscope.   ANESTHESIA:  MAC.   INDICATIONS:  This is a 65 year old G1, P1-0-0-1 woman who was having no  vaginal bleeding until about 1 month ago at which time she started  having spotting.  The patient was evaluated with an ultrasound.  Ultrasound revealed thickened endometrium.  She then had a  sonohysterogram which revealed a probable endometrial polyp.  Because of  this, she is brought to the operating room for D and C, hysteroscopy  with resectoscope.   FINDINGS:  Anteflexed uterus with no adnexal mass, with approximately 1  x 2 to 3-cm polyp.   PROCEDURE:  The patient was brought into the operating room and given  adequate IV sedation.  She was placed in dorsal lithotomy position.  Her  perineum was washed with Betadine.  The patient was draped in sterile  fashion.  After pelvic examination had been performed, speculum was  placed within the vagina.  The vagina was washed with Betadine.  Paracervical block was administered with 1% Nesacaine.  The anterior lip  of the cervix was grasped with a single-tooth tenaculum.  The cervix was  sounded.  The cervix was dilated with Pratt dilators to a #31.  Hysteroscope was easily introduced into the endometrial cavity.  Pictures were obtained.  The endometrial polyp was seen.  Using a single  loop right-angle resector, the endometrial polyp was removed and then  small sheets of endometrial tissue were removed below  the base of the  polyp to assure removal of the polyp in its entirety.  No further  resections were taken because nothing else abnormal was seen.  Adequate  hemostasis was present.  Pictures were obtained.  All instruments were  removed from the vagina.  The patient was taken out of the dorsal  lithotomy position.  She was awakened.  She was moved from the operating  table to a stretcher in stable condition.   COMPLICATIONS:  None.   ESTIMATED BLOOD LOSS:  Less than 5 mL.   GLYCINE DIFFERENTIAL:  -40 mL.   SPECIMEN:  1. Endometrial polyp.  2. Endometrial resections sent to pathology.      Sherry A. Rosalio Macadamia, M.D.  Electronically Signed     SAD/MEDQ  D:  03/08/2009  T:  03/08/2009  Job:  [id number]   cc:   Jeannett Senior A. Clent Ridges, MD  [address] [place]  [place] [address]

## 2010-11-22 NOTE — Assessment & Plan Note (Signed)
[facility] OFFICE NOTE   Miranda Mcguire, Miranda Mcguire                     MRN:          [id number]  DATE:03/30/2006                            DOB:          07/24/1945    This is a 65 year old woman here for a complete physical examination.  She  has no particular complaints today.  Her joint pain has remained under  fairly good control as do her allergies.  She stays on her usual  medications.  She recently had a regular checkup with her optometrist who  found that she had early cataracts.  She is requesting referral to see an  ophthalmologist.  Her vision is mildly affected but not too badly.  We have  been following her for other problems including elevated lipids,  hypothyroidism, osteopenia, etc.  I have recommended screening colonoscopy  to her every year for some time and she has always refused in the past and  refuses again today.   For further details of her past medical history, family history, social  history, habits, refer to her last physical note dated March 24, 2005.   ALLERGIES:  1. PENICILLIN.  2. ASPIRIN.   CURRENT MEDICATIONS:  1. Levoxyl 50 mcg per day.  2. Lipitor 20 mg per day.  3. Nabumetone 750 mg b.i.d.  4. Fosamax 35 mg weekly.  5. Multivitamin daily.  6. Zyrtec D b.i.d.   OBJECTIVE:  Height 5 foot 3/4th of an inch, weight 163.  BP 142/78, pulse 84  and regular.  In general, she appears to be at her baseline which is  somewhat overweight.  Skin is free of significant lesions.  Eyes clear.  She  wears glasses.  Ears clear.  Pharynx clear.  Neck supple without  lymphadenopathy or masses.  Lungs are clear.  Cardiac rate and rhythm  regular without gallops, murmurs or rubs.  Distal pulses are full.  EKG is  within normal limits.  Breasts and axilla are clear.  Abdomen is soft,  normal bowel sounds, nontender.  No masses. Pelvic exam and external  genitalia within normal limits.   Vagina is clear.  Cervix is clear.  Pap  smear is obtained.  Uterus not enlarged.  No adnexa, masses or tenderness.  Rectal exam with no masses or tenderness.  Stool hemoccult negative.  Extremities no clubbing, cyanosis, or edema.  Neurologic exam is grossly  intact.   She was here for fasting labs on March 19, 2006.  These were all within  normal limits including an LDL of 108, HDL of 55 and a TSH of 1.30.   ASSESSMENT/PLAN:  1. Complete physical exam:  We talked about increasing exercise and losing      a little weight.  The patient will set up her own mammogram.  I once      again recommended screening colonoscopy and once again she refused to      do so.  She related to me a story where a friend of hers in [place]      had a perforation of her colon during a  colonoscopy therefore she will      never have one herself.  2. Hyperlipidemia, stable.  3. Hypothyroidism, stable.  4. Osteopenia, stable.  5. Degenerative joint disease, stable.  6. Allergies, stable.  7. Recent diagnosis of cataracts:  We will refer her to see ophthalmology.                                   Tera Mater. Clent Ridges, MD   SAF/MedQ  DD:  03/31/2006  DT:  04/01/2006  Job #:  [id number]

## 2010-11-22 NOTE — Op Note (Signed)
NAMETIONNA, GIGANTE NO.:  [id number]   MEDICAL RECORD NO.:  [id number]          PATIENT TYPE:  OIB   LOCATION:  2866                         FACILITY:  [facility]   PHYSICIAN:  Donalee Citrin, M.D.        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   OPERATION PERFORMED:  Lumbar laminectomy and microdiskectomy, L4-5 with  microscopic dissection of the L5 nerve root, microscopic diskectomy.   SURGEON:  Donalee Citrin, M.D.   ASSISTANT:  Tia Alert, MD   ANESTHESIA:  General endotracheal.   INDICATIONS FOR PROCEDURE:  The patient is a very pleasant 65 year old  female who has had longstanding back and left leg pain going on for the last  few weeks.  She has noticed weakness in the left foot on dorsiflexion, had  difficulty on exam on heel walk and had a little bit of dorsiflexion and  weakness.  Preoperative imaging showed ruptured disk and free fragment,  migrating cephalad from the 4-5 disk space up behind the 4 body, compressing  the 4 root against the pedicle.  Due to the patient's preoperative weakness  and her exam consistent with a free fragment, patient was recommended  laminectomy and microdiskectomy.  Risks and benefits of surgery were  explained.  She understands and agreed to proceed forward.   DESCRIPTION OF PROCEDURE:  The patient was brought to the operating room and  was induced under general anesthesia.  Placed prone on Wilson frame.  Back  prepped and draped in the usual sterile fashion.  Preop x-ray localized the  L4-5 disk space.  Midline incision made after infiltration  of 10 mL  lidocaine with epinephrine and Bovie electrocautery was used to take down  subcutaneous tissues and subperiosteal dissection was carried out of the  lamina of L4 and L5 on the left.  Intraoperative x-ray initially had a probe  underneath the 2-3 disk space and due to patient's lordosis, this is what  created the disparity, so the retractor was just reangled without having to  extend the incision inferiorly and the 4-5 lamina were identified and repeat  x-ray confirmed the 4-5 disk space.  Then the inferior aspect of the lamina  of L4 and the medial facet complex, superior aspect of lamina of L5 was  removed.  Virtually entire lamina of L4 was removed to gain access to the  level of the L4 pedicle.  Using a 2 and 3 mm Kerrison punch, the  undersurface of the gutter was underbitten and the ligamentum flavum was  identified and was noted to be markedly hypertrophied, compressing the  thecal sac and actually overgrowing the proximal L5 nerve root.  This was  dissected away with a 4 Penfield and removed in  piecemeal fashion.  Then  after the 5 foramen was identified and the 5 pedicle was identified, the  laminectomy was extended cephalad to identify the 4 pedicle which was  palpated with a nerve hook.  The D'Errico was used to reflect the L5 nerve  root medially to expose the disk space.  The disk space itself was noted to  be flat and not herniated so attention was taken behind the 4 body above the  level of the disk space.  A free fragment was noted to be presenting through  the ligament level so the area of the ligament __________ free fragment was  incised with an 11 blade scalpel and a nerve hook was used to tease out  large fragments of disk. At the end of fragmentectomy, there was no further  fragments appreciated. The 4 root was widely patent out its foramen.  The 4  pedicle was palpated along the medial border and this was noted to be free  and clear and completely  decompressed.  The disk space was again reinspected  and felt not to require further invasiveness.  There was no annular rent in  the center of the disk space.  There was a small irregularity at the superior aspect of the disk space.  This was coagulated and not felt to be significant.  So then the 5 foramen  was explored.  The 5 pedicle was explored with a coronary dilator and angled  hockey stick.  I did the same thing with the 4 foramen, again and the wound  was copiously irrigated and meticulous hemostasis was maintained.  Gelfoam  was overlaid on top of the dura.  The muscle, fascia and skin were closed in  layers with interrupted Vicryl and a 4-0 running subcuticular in the skin.       GC/MEDQ  D:  02/12/2005  T:  02/13/2005  Job:  [id number]

## 2010-11-25 NOTE — Progress Notes (Signed)
Spoke with patient.

## 2011-04-01 ENCOUNTER — Other Ambulatory Visit: Payer: Self-pay | Admitting: Family Medicine

## 2011-04-01 DIAGNOSIS — Z1231 Encounter for screening mammogram for malignant neoplasm of breast: Secondary | ICD-10-CM

## 2011-05-12 ENCOUNTER — Ambulatory Visit
Admission: RE | Admit: 2011-05-12 | Discharge: 2011-05-12 | Disposition: A | Discharge status: Home / Self Care | Payer: 59 | Source: Ambulatory Visit | Attending: Family Medicine | Admitting: Family Medicine

## 2011-05-12 DIAGNOSIS — Z1231 Encounter for screening mammogram for malignant neoplasm of breast: Secondary | ICD-10-CM

## 2011-05-22 ENCOUNTER — Encounter: Payer: Self-pay | Admitting: Family Medicine

## 2011-05-22 ENCOUNTER — Ambulatory Visit (INDEPENDENT_AMBULATORY_CARE_PROVIDER_SITE_OTHER): Payer: 59 | Admitting: Family Medicine

## 2011-05-22 VITALS — BP 130/78 | HR 85 | Temp 98.6°F | Ht 60.0 in | Wt 168.0 lb

## 2011-05-22 DIAGNOSIS — Z0000 Encounter for general adult medical examination without abnormal findings: Secondary | ICD-10-CM

## 2011-05-22 LAB — CBC WITH DIFFERENTIAL/PLATELET
Basophils Relative: 0.6 % (ref 0.0–3.0)
Eosinophils Absolute: 0.1 10*3/uL (ref 0.0–0.7)
Eosinophils Relative: 2 % (ref 0.0–5.0)
Hemoglobin: 12.5 g/dL (ref 12.0–15.0)
Lymphocytes Relative: 25.1 % (ref 12.0–46.0)
Monocytes Relative: 7.7 % (ref 3.0–12.0)
Neutro Abs: 3.9 10*3/uL (ref 1.4–7.7)
Neutrophils Relative %: 64.6 % (ref 43.0–77.0)
RBC: 4.51 Mil/uL (ref 3.87–5.11)
WBC: 6.1 10*3/uL (ref 4.5–10.5)

## 2011-05-22 LAB — HEPATIC FUNCTION PANEL
AST: 19 U/L (ref 0–37)
Albumin: 3.6 g/dL (ref 3.5–5.2)
Total Bilirubin: 0.9 mg/dL (ref 0.3–1.2)

## 2011-05-22 LAB — POCT URINALYSIS DIPSTICK
Bilirubin, UA: NEGATIVE
Glucose, UA: NEGATIVE
Ketones, UA: NEGATIVE
Leukocytes, UA: NEGATIVE
Nitrite, UA: NEGATIVE
pH, UA: 5

## 2011-05-22 LAB — BASIC METABOLIC PANEL
BUN: 14 mg/dL (ref 6–23)
Calcium: 9 mg/dL (ref 8.4–10.5)
GFR: 81.9 mL/min (ref >60.00)
Glucose, Bld: 93 mg/dL (ref 70–99)
Potassium: 4.2 mEq/L (ref 3.5–5.1)
Sodium: 142 mEq/L (ref 135–145)

## 2011-05-22 LAB — LIPID PANEL
HDL: 62.5 mg/dL (ref >39.00)
LDL Cholesterol: 76 mg/dL (ref 0–99)
VLDL: 21 mg/dL (ref 0.0–40.0)

## 2011-05-22 MED ORDER — NABUMETONE 750 MG PO TABS: 750.0000 mg | ORAL_TABLET | Freq: Two times a day (BID) | ORAL | Status: DC

## 2011-05-22 MED ORDER — ATORVASTATIN CALCIUM 20 MG PO TABS: 20.0000 mg | ORAL_TABLET | Freq: Every day | ORAL | Status: DC

## 2011-05-22 MED ORDER — LEVOTHYROXINE SODIUM 50 MCG PO TABS: 50.0000 ug | ORAL_TABLET | Freq: Every day | ORAL | Status: DC

## 2011-05-22 NOTE — Progress Notes (Signed)
  Subjective:    Patient ID: Miranda Mcguire, female    DOB: 22-Dec-1945, 65 y.o.   MRN: [id number]  HPI 65 yr old female for a cpx. She feels well and has no concerns.    Review of Systems  Constitutional: Negative.   HENT: Negative.   Eyes: Negative.   Respiratory: Negative.   Cardiovascular: Negative.   Gastrointestinal: Negative.   Genitourinary: Negative for dysuria, urgency, frequency, hematuria, flank pain, decreased urine volume, enuresis, difficulty urinating, pelvic pain and dyspareunia.  Musculoskeletal: Negative.   Skin: Negative.   Neurological: Negative.   Hematological: Negative.   Psychiatric/Behavioral: Negative.        Objective:   Physical Exam  Constitutional: She is oriented to person, place, and time. She appears well-developed and well-nourished. No distress.  HENT:  Head: Normocephalic and atraumatic.  Right Ear: External ear normal.  Left Ear: External ear normal.  Nose: Nose normal.  Mouth/Throat: Oropharynx is clear and moist. No oropharyngeal exudate.  Eyes: Conjunctivae and EOM are normal. Pupils are equal, round, and reactive to light. No scleral icterus.  Neck: Normal range of motion. Neck supple. No JVD present. No thyromegaly present.  Cardiovascular: Normal rate, regular rhythm, normal heart sounds and intact distal pulses.  Exam reveals no gallop and no friction rub.   No murmur heard.      EKG normal   Pulmonary/Chest: Effort normal and breath sounds normal. No respiratory distress. She has no wheezes. She has no rales. She exhibits no tenderness.  Abdominal: Soft. Bowel sounds are normal. She exhibits no distension and no mass. There is no tenderness. There is no rebound and no guarding.  Musculoskeletal: Normal range of motion. She exhibits no edema and no tenderness.  Lymphadenopathy:    She has no cervical adenopathy.  Neurological: She is alert and oriented to person, place, and time. She has normal reflexes. No cranial nerve deficit.  She exhibits normal muscle tone. Coordination normal.  Skin: Skin is warm and dry. No rash noted. No erythema.  Psychiatric: She has a normal mood and affect. Her behavior is normal. Judgment and thought content normal.          Assessment & Plan:  Well exam. Get labs today

## 2011-05-23 LAB — TSH: TSH: 1.75 u[IU]/mL (ref 0.35–5.50)

## 2011-05-26 NOTE — Progress Notes (Signed)
Quick Note:  Pt husband informed ______ 

## 2011-10-14 ENCOUNTER — Telehealth: Payer: Self-pay | Admitting: Family Medicine

## 2011-10-14 DIAGNOSIS — E785 Hyperlipidemia, unspecified: Secondary | ICD-10-CM

## 2011-10-14 NOTE — Telephone Encounter (Signed)
Pt called and is req to sch her 6 month for herself and spouse. Pt is req to get lab work done prior to [employer]. Pls order necessary labs and advise.

## 2011-10-16 NOTE — Telephone Encounter (Signed)
Called pt and schd labs for 11/20/11 and 6 month fup on 11/26/11 as noted.  

## 2011-10-16 NOTE — Telephone Encounter (Signed)
I put future lab orders in computer. Can you call pt to schedule the lab and it is a fasting lab draw.

## 2011-10-16 NOTE — Telephone Encounter (Signed)
Set up fasting lipids and liver panel for 272.4

## 2011-11-20 ENCOUNTER — Other Ambulatory Visit (INDEPENDENT_AMBULATORY_CARE_PROVIDER_SITE_OTHER): Payer: 59

## 2011-11-20 DIAGNOSIS — E785 Hyperlipidemia, unspecified: Secondary | ICD-10-CM

## 2011-11-20 LAB — HEPATIC FUNCTION PANEL
ALT: 24 U/L (ref 0–35)
Albumin: 3.6 g/dL (ref 3.5–5.2)
Total Bilirubin: 1.1 mg/dL (ref 0.3–1.2)

## 2011-11-20 LAB — LIPID PANEL
HDL: 65.7 mg/dL (ref >39.00)
LDL Cholesterol: 77 mg/dL (ref 0–99)
Total CHOL/HDL Ratio: 2
Triglycerides: 90 mg/dL (ref 0.0–149.0)
VLDL: 18 mg/dL (ref 0.0–40.0)

## 2011-11-26 ENCOUNTER — Ambulatory Visit (INDEPENDENT_AMBULATORY_CARE_PROVIDER_SITE_OTHER): Payer: 59 | Admitting: Family Medicine

## 2011-11-26 ENCOUNTER — Encounter: Payer: Self-pay | Admitting: Family Medicine

## 2011-11-26 VITALS — BP 130/80 | HR 92 | Temp 98.4°F | Wt 168.0 lb

## 2011-11-26 DIAGNOSIS — I10 Essential (primary) hypertension: Secondary | ICD-10-CM

## 2011-11-26 DIAGNOSIS — E785 Hyperlipidemia, unspecified: Secondary | ICD-10-CM

## 2011-11-26 NOTE — Progress Notes (Signed)
  Subjective:    Patient ID: Miranda Mcguire, female    DOB: 1946-04-29, 66 y.o.   MRN: [id number]  HPI Here for a 6 month follow up. She feels fine and has no problems. Her recent labs showed excellent control of lipids.    Review of Systems  Constitutional: Negative.   Respiratory: Negative.   Cardiovascular: Negative.        Objective:   Physical Exam  Constitutional: She appears well-developed and well-nourished.  Cardiovascular: Normal rate, regular rhythm, normal heart sounds and intact distal pulses.   Pulmonary/Chest: Effort normal and breath sounds normal.          Assessment & Plan:  Doing well. Stay on same regimen

## 2011-12-26 ENCOUNTER — Other Ambulatory Visit: Payer: Self-pay | Admitting: Family Medicine

## 2012-02-09 ENCOUNTER — Telehealth: Payer: Self-pay | Admitting: Family Medicine

## 2012-02-09 MED ORDER — LEVOTHYROXINE SODIUM 50 MCG PO TABS: 50.0000 ug | ORAL_TABLET | Freq: Every day | ORAL | Status: DC

## 2012-02-09 NOTE — Telephone Encounter (Signed)
meds were sent in

## 2012-03-19 ENCOUNTER — Other Ambulatory Visit: Payer: Self-pay | Admitting: Family Medicine

## 2012-03-23 ENCOUNTER — Other Ambulatory Visit: Payer: Self-pay | Admitting: Family Medicine

## 2012-04-08 ENCOUNTER — Other Ambulatory Visit: Payer: Self-pay | Admitting: Family Medicine

## 2012-04-08 DIAGNOSIS — Z1231 Encounter for screening mammogram for malignant neoplasm of breast: Secondary | ICD-10-CM

## 2012-05-17 ENCOUNTER — Ambulatory Visit
Admission: RE | Admit: 2012-05-17 | Discharge: 2012-05-17 | Disposition: A | Discharge status: Home / Self Care | Payer: BC Managed Care – PPO | Source: Ambulatory Visit | Attending: Family Medicine | Admitting: Family Medicine

## 2012-05-17 ENCOUNTER — Other Ambulatory Visit (INDEPENDENT_AMBULATORY_CARE_PROVIDER_SITE_OTHER): Payer: BC Managed Care – PPO

## 2012-05-17 DIAGNOSIS — Z1231 Encounter for screening mammogram for malignant neoplasm of breast: Secondary | ICD-10-CM

## 2012-05-17 DIAGNOSIS — Z0000 Encounter for general adult medical examination without abnormal findings: Secondary | ICD-10-CM

## 2012-05-17 LAB — BASIC METABOLIC PANEL
CO2: 29 mEq/L (ref 19–32)
Calcium: 9 mg/dL (ref 8.4–10.5)
GFR: 78.04 mL/min (ref >60.00)
Glucose, Bld: 103 mg/dL — ABNORMAL HIGH (ref 70–99)
Potassium: 4.4 mEq/L (ref 3.5–5.1)
Sodium: 141 mEq/L (ref 135–145)

## 2012-05-17 LAB — HEPATIC FUNCTION PANEL
ALT: 24 U/L (ref 0–35)
AST: 22 U/L (ref 0–37)
Albumin: 3.5 g/dL (ref 3.5–5.2)
Alkaline Phosphatase: 54 U/L (ref 39–117)
Total Protein: 6.7 g/dL (ref 6.0–8.3)

## 2012-05-17 LAB — CBC WITH DIFFERENTIAL/PLATELET
Basophils Absolute: 0.1 10*3/uL (ref 0.0–0.1)
Basophils Relative: 1 % (ref 0.0–3.0)
HCT: 39 % (ref 36.0–46.0)
Hemoglobin: 12.6 g/dL (ref 12.0–15.0)
Lymphocytes Relative: 27.3 % (ref 12.0–46.0)
Lymphs Abs: 1.6 10*3/uL (ref 0.7–4.0)
Monocytes Relative: 9.7 % (ref 3.0–12.0)
Neutro Abs: 3.4 10*3/uL (ref 1.4–7.7)
RBC: 4.59 Mil/uL (ref 3.87–5.11)
RDW: 12.9 % (ref 11.5–14.6)

## 2012-05-17 LAB — POCT URINALYSIS DIPSTICK
Glucose, UA: NEGATIVE
Leukocytes, UA: NEGATIVE
Nitrite, UA: NEGATIVE
Protein, UA: NEGATIVE
Urobilinogen, UA: 0.2

## 2012-05-17 LAB — TSH: TSH: 1.72 u[IU]/mL (ref 0.35–5.50)

## 2012-05-17 LAB — LIPID PANEL
Cholesterol: 174 mg/dL (ref 0–200)
LDL Cholesterol: 91 mg/dL (ref 0–99)
Triglycerides: 129 mg/dL (ref 0.0–149.0)

## 2012-05-19 NOTE — Progress Notes (Signed)
Quick Note:  I left voice message with results. ______ 

## 2012-05-20 ENCOUNTER — Other Ambulatory Visit: Payer: Self-pay | Admitting: Family Medicine

## 2012-05-20 DIAGNOSIS — R928 Other abnormal and inconclusive findings on diagnostic imaging of breast: Secondary | ICD-10-CM

## 2012-05-21 ENCOUNTER — Ambulatory Visit
Admission: RE | Admit: 2012-05-21 | Discharge: 2012-05-21 | Disposition: A | Discharge status: Home / Self Care | Payer: BC Managed Care – PPO | Source: Ambulatory Visit | Attending: Family Medicine | Admitting: Family Medicine

## 2012-05-21 DIAGNOSIS — R928 Other abnormal and inconclusive findings on diagnostic imaging of breast: Secondary | ICD-10-CM

## 2012-05-24 ENCOUNTER — Encounter: Payer: 59 | Admitting: Family Medicine

## 2012-05-25 ENCOUNTER — Encounter: Payer: Self-pay | Admitting: Family Medicine

## 2012-05-25 ENCOUNTER — Telehealth: Payer: Self-pay | Admitting: Family Medicine

## 2012-05-25 ENCOUNTER — Ambulatory Visit (INDEPENDENT_AMBULATORY_CARE_PROVIDER_SITE_OTHER): Payer: BC Managed Care – PPO | Admitting: Family Medicine

## 2012-05-25 VITALS — BP 136/62 | HR 102 | Temp 98.2°F | Ht 60.0 in | Wt 167.0 lb

## 2012-05-25 DIAGNOSIS — Z0000 Encounter for general adult medical examination without abnormal findings: Secondary | ICD-10-CM

## 2012-05-25 MED ORDER — NABUMETONE 750 MG PO TABS: 750.0000 mg | ORAL_TABLET | Freq: Two times a day (BID) | ORAL | Status: DC

## 2012-05-25 NOTE — Progress Notes (Signed)
  Subjective:    Patient ID: Miranda Mcguire, female    DOB: 1945/08/18, 66 y.o.   MRN: [id number]  HPI 66 yr old female for a cpx. She feels well and has no concerns.   Review of Systems  Constitutional: Negative.   HENT: Negative.   Eyes: Negative.   Respiratory: Negative.   Cardiovascular: Negative.   Gastrointestinal: Negative.   Genitourinary: Negative for dysuria, urgency, frequency, hematuria, flank pain, decreased urine volume, enuresis, difficulty urinating, pelvic pain and dyspareunia.  Musculoskeletal: Negative.   Skin: Negative.   Neurological: Negative.   Hematological: Negative.   Psychiatric/Behavioral: Negative.        Objective:   Physical Exam  Constitutional: She is oriented to person, place, and time. She appears well-developed and well-nourished. No distress.  HENT:  Head: Normocephalic and atraumatic.  Right Ear: External ear normal.  Left Ear: External ear normal.  Nose: Nose normal.  Mouth/Throat: Oropharynx is clear and moist. No oropharyngeal exudate.  Eyes: Conjunctivae normal and EOM are normal. Pupils are equal, round, and reactive to light. No scleral icterus.  Neck: Normal range of motion. Neck supple. No JVD present. No thyromegaly present.  Cardiovascular: Normal rate, regular rhythm, normal heart sounds and intact distal pulses.  Exam reveals no gallop and no friction rub.   No murmur heard.      EKG normal   Pulmonary/Chest: Effort normal and breath sounds normal. No respiratory distress. She has no wheezes. She has no rales. She exhibits no tenderness.  Abdominal: Soft. Bowel sounds are normal. She exhibits no distension and no mass. There is no tenderness. There is no rebound and no guarding.  Musculoskeletal: Normal range of motion. She exhibits no edema and no tenderness.  Lymphadenopathy:    She has no cervical adenopathy.  Neurological: She is alert and oriented to person, place, and time. She has normal reflexes. No cranial nerve  deficit. She exhibits normal muscle tone. Coordination normal.  Skin: Skin is warm and dry. No rash noted. No erythema.  Psychiatric: She has a normal mood and affect. Her behavior is normal. Judgment and thought content normal.          Assessment & Plan:  Well exam.

## 2012-05-25 NOTE — Telephone Encounter (Signed)
Refill request for Nabumetone, must send for # [phone], and I did send e-scribe

## 2012-06-22 ENCOUNTER — Telehealth: Payer: Self-pay | Admitting: Family Medicine

## 2012-06-22 MED ORDER — PHENTERMINE HCL 37.5 MG PO CAPS: 37.5000 mg | ORAL_CAPSULE | ORAL | Status: DC

## 2012-06-22 MED ORDER — ATORVASTATIN CALCIUM 20 MG PO TABS: 20.0000 mg | ORAL_TABLET | Freq: Every day | ORAL | Status: DC

## 2012-06-22 NOTE — Telephone Encounter (Signed)
rx given to her 

## 2012-06-22 NOTE — Telephone Encounter (Signed)
Refill request for Lipitor ( 90 day supply ) and I did send e-scribe.

## 2012-09-13 ENCOUNTER — Telehealth: Payer: Self-pay | Admitting: Family Medicine

## 2012-09-13 NOTE — Telephone Encounter (Signed)
Pt requested labs for lipids & hepatic. Okay, per Dr. Clent Ridges to order, which I did.

## 2012-11-15 ENCOUNTER — Other Ambulatory Visit (INDEPENDENT_AMBULATORY_CARE_PROVIDER_SITE_OTHER): Payer: BC Managed Care – PPO

## 2012-11-15 DIAGNOSIS — E785 Hyperlipidemia, unspecified: Secondary | ICD-10-CM

## 2012-11-15 LAB — LIPID PANEL
Cholesterol: 163 mg/dL (ref 0–200)
Total CHOL/HDL Ratio: 3
Triglycerides: 104 mg/dL (ref 0.0–149.0)

## 2012-11-15 LAB — HEPATIC FUNCTION PANEL
ALT: 22 U/L (ref 0–35)
AST: 19 U/L (ref 0–37)
Albumin: 3.6 g/dL (ref 3.5–5.2)
Alkaline Phosphatase: 52 U/L (ref 39–117)
Total Protein: 6.9 g/dL (ref 6.0–8.3)

## 2012-11-15 NOTE — Progress Notes (Signed)
Quick Note:  Pt has appointment on 11/22/12 will go over then. ______ 

## 2012-11-22 ENCOUNTER — Encounter: Payer: Self-pay | Admitting: Family Medicine

## 2012-11-22 ENCOUNTER — Ambulatory Visit (INDEPENDENT_AMBULATORY_CARE_PROVIDER_SITE_OTHER): Payer: BC Managed Care – PPO | Admitting: Family Medicine

## 2012-11-22 VITALS — BP 144/82 | HR 89 | Temp 97.9°F | Wt 165.0 lb

## 2012-11-22 DIAGNOSIS — E039 Hypothyroidism, unspecified: Secondary | ICD-10-CM

## 2012-11-22 DIAGNOSIS — E785 Hyperlipidemia, unspecified: Secondary | ICD-10-CM

## 2012-11-22 DIAGNOSIS — R030 Elevated blood-pressure reading, without diagnosis of hypertension: Secondary | ICD-10-CM

## 2012-11-22 NOTE — Progress Notes (Signed)
  Subjective:    Patient ID: Miranda Mcguire, female    DOB: 07-02-1946, 67 y.o.   MRN: [id number]  HPI Here to follow up. She feels well. She walks about 2 days a week.    Review of Systems  Constitutional: Negative.   Respiratory: Negative.   Cardiovascular: Negative.        Objective:   Physical Exam  Constitutional: She appears well-developed and well-nourished.  Cardiovascular: Normal rate, regular rhythm, normal heart sounds and intact distal pulses.   Pulmonary/Chest: Effort normal and breath sounds normal.          Assessment & Plan:  Her lipids look great but the BP is still borderline high. Advised her to walk 5-6 days a week.

## 2012-11-27 ENCOUNTER — Other Ambulatory Visit: Payer: Self-pay | Admitting: Family Medicine

## 2013-02-09 ENCOUNTER — Encounter: Payer: Self-pay | Admitting: Gastroenterology

## 2013-03-23 ENCOUNTER — Other Ambulatory Visit: Payer: Self-pay | Admitting: Family Medicine

## 2013-04-18 ENCOUNTER — Other Ambulatory Visit: Payer: Self-pay

## 2013-04-18 DIAGNOSIS — Z1231 Encounter for screening mammogram for malignant neoplasm of breast: Secondary | ICD-10-CM

## 2013-05-23 ENCOUNTER — Ambulatory Visit
Admission: RE | Admit: 2013-05-23 | Discharge: 2013-05-23 | Disposition: A | Discharge status: Home / Self Care | Payer: 59 | Source: Ambulatory Visit

## 2013-05-23 DIAGNOSIS — Z1231 Encounter for screening mammogram for malignant neoplasm of breast: Secondary | ICD-10-CM

## 2013-05-27 ENCOUNTER — Other Ambulatory Visit (INDEPENDENT_AMBULATORY_CARE_PROVIDER_SITE_OTHER): Payer: 59

## 2013-05-27 DIAGNOSIS — Z0000 Encounter for general adult medical examination without abnormal findings: Secondary | ICD-10-CM

## 2013-05-27 LAB — POCT URINALYSIS DIPSTICK
Bilirubin, UA: NEGATIVE
Blood, UA: NEGATIVE
Glucose, UA: NEGATIVE
Ketones, UA: NEGATIVE
Leukocytes, UA: NEGATIVE
Nitrite, UA: NEGATIVE
Urobilinogen, UA: 0.2

## 2013-05-27 LAB — TSH: TSH: 1.55 u[IU]/mL (ref 0.35–5.50)

## 2013-05-27 LAB — CBC WITH DIFFERENTIAL/PLATELET
Basophils Absolute: 0 10*3/uL (ref 0.0–0.1)
Eosinophils Absolute: 0.1 10*3/uL (ref 0.0–0.7)
Eosinophils Relative: 2.2 % (ref 0.0–5.0)
Lymphs Abs: 1.6 10*3/uL (ref 0.7–4.0)
MCHC: 33.7 g/dL (ref 30.0–36.0)
MCV: 81.9 fl (ref 78.0–100.0)
Monocytes Absolute: 0.5 10*3/uL (ref 0.1–1.0)
Neutrophils Relative %: 63.6 % (ref 43.0–77.0)
Platelets: 170 10*3/uL (ref 150.0–400.0)
RDW: 12.8 % (ref 11.5–14.6)
WBC: 6.2 10*3/uL (ref 4.5–10.5)

## 2013-05-27 LAB — BASIC METABOLIC PANEL
BUN: 15 mg/dL (ref 6–23)
CO2: 27 mEq/L (ref 19–32)
Chloride: 105 mEq/L (ref 96–112)
Creatinine, Ser: 0.8 mg/dL (ref 0.4–1.2)
Glucose, Bld: 103 mg/dL — ABNORMAL HIGH (ref 70–99)

## 2013-05-27 LAB — LIPID PANEL
Cholesterol: 155 mg/dL (ref 0–200)
HDL: 61.2 mg/dL (ref >39.00)
LDL Cholesterol: 76 mg/dL (ref 0–99)
VLDL: 17.4 mg/dL (ref 0.0–40.0)

## 2013-05-27 LAB — HEPATIC FUNCTION PANEL
Bilirubin, Direct: 0.1 mg/dL (ref 0.0–0.3)
Total Bilirubin: 1.1 mg/dL (ref 0.3–1.2)

## 2013-06-06 ENCOUNTER — Encounter: Payer: Self-pay | Admitting: Family Medicine

## 2013-06-06 ENCOUNTER — Ambulatory Visit (INDEPENDENT_AMBULATORY_CARE_PROVIDER_SITE_OTHER): Payer: 59 | Admitting: Family Medicine

## 2013-06-06 VITALS — BP 138/70 | HR 109 | Temp 98.8°F | Ht 60.0 in | Wt 169.0 lb

## 2013-06-06 DIAGNOSIS — Z0000 Encounter for general adult medical examination without abnormal findings: Secondary | ICD-10-CM

## 2013-06-06 NOTE — Progress Notes (Signed)
   Subjective:    Patient ID: Miranda Mcguire, female    DOB: Jul 21, 1945, 67 y.o.   MRN: [id number]  HPI 67 yr old female for a cpx. She feels well.    Review of Systems  Constitutional: Negative.   HENT: Negative.   Eyes: Negative.   Respiratory: Negative.   Cardiovascular: Negative.   Gastrointestinal: Negative.   Genitourinary: Negative for dysuria, urgency, frequency, hematuria, flank pain, decreased urine volume, enuresis, difficulty urinating, pelvic pain and dyspareunia.  Musculoskeletal: Negative.   Skin: Negative.   Neurological: Negative.   Psychiatric/Behavioral: Negative.        Objective:   Physical Exam  Constitutional: She is oriented to person, place, and time. She appears well-developed and well-nourished. No distress.  HENT:  Head: Normocephalic and atraumatic.  Right Ear: External ear normal.  Left Ear: External ear normal.  Nose: Nose normal.  Mouth/Throat: Oropharynx is clear and moist. No oropharyngeal exudate.  Eyes: Conjunctivae and EOM are normal. Pupils are equal, round, and reactive to light. No scleral icterus.  Neck: Normal range of motion. Neck supple. No JVD present. No thyromegaly present.  Cardiovascular: Normal rate, regular rhythm, normal heart sounds and intact distal pulses.  Exam reveals no gallop and no friction rub.   No murmur heard. EKG normal   Pulmonary/Chest: Effort normal and breath sounds normal. No respiratory distress. She has no wheezes. She has no rales. She exhibits no tenderness.  Abdominal: Soft. Bowel sounds are normal. She exhibits no distension and no mass. There is no tenderness. There is no rebound and no guarding.  Musculoskeletal: Normal range of motion. She exhibits no edema and no tenderness.  Lymphadenopathy:    She has no cervical adenopathy.  Neurological: She is alert and oriented to person, place, and time. She has normal reflexes. No cranial nerve deficit. She exhibits normal muscle tone. Coordination  normal.  Skin: Skin is warm and dry. No rash noted. No erythema.  Psychiatric: She has a normal mood and affect. Her behavior is normal. Judgment and thought content normal.          Assessment & Plan:  Well exam.

## 2013-06-06 NOTE — Progress Notes (Signed)
Pre visit review using our clinic review tool, if applicable. No additional management support is needed unless otherwise documented below in the visit note. 

## 2013-08-05 ENCOUNTER — Telehealth: Payer: Self-pay | Admitting: Family Medicine

## 2013-08-05 NOTE — Telephone Encounter (Signed)
Pt needs refill on nabumetone 750 mg #180,atorvastatin 20 mg #90 and levothyroxine 50 mcg #90 with refills sent to [facility]

## 2013-08-08 MED ORDER — NABUMETONE 750 MG PO TABS: ORAL_TABLET | ORAL | Status: DC

## 2013-08-08 MED ORDER — ATORVASTATIN CALCIUM 20 MG PO TABS: 20.0000 mg | ORAL_TABLET | Freq: Every day | ORAL | Status: DC

## 2013-08-08 MED ORDER — LEVOTHYROXINE SODIUM 50 MCG PO TABS: 50.0000 ug | ORAL_TABLET | Freq: Every day | ORAL | Status: DC

## 2013-08-08 NOTE — Telephone Encounter (Signed)
I sent all 3 scripts e-scribe. 

## 2013-11-21 ENCOUNTER — Telehealth: Payer: Self-pay | Admitting: Family Medicine

## 2013-11-21 DIAGNOSIS — E039 Hypothyroidism, unspecified: Secondary | ICD-10-CM

## 2013-11-21 DIAGNOSIS — E785 Hyperlipidemia, unspecified: Secondary | ICD-10-CM

## 2013-11-21 NOTE — Telephone Encounter (Signed)
Pat's were ordered

## 2013-11-21 NOTE — Telephone Encounter (Signed)
Pt called to set up a 6 month follow up for her and he husband Sonia Side. She said Dr Sarajane Jews have them come in for labs a week before. Can you verify  and if so we need orders  for labs

## 2013-11-21 NOTE — Telephone Encounter (Signed)
Orders placed.

## 2013-11-21 NOTE — Telephone Encounter (Signed)
Can you call to schedule the lab appointment?

## 2013-12-04 ENCOUNTER — Encounter: Payer: Self-pay | Admitting: Gastroenterology

## 2013-12-08 NOTE — Telephone Encounter (Signed)
appt scheduled

## 2013-12-09 ENCOUNTER — Other Ambulatory Visit (INDEPENDENT_AMBULATORY_CARE_PROVIDER_SITE_OTHER): Payer: 59

## 2013-12-09 ENCOUNTER — Other Ambulatory Visit: Payer: 59

## 2013-12-09 DIAGNOSIS — E039 Hypothyroidism, unspecified: Secondary | ICD-10-CM

## 2013-12-09 DIAGNOSIS — E785 Hyperlipidemia, unspecified: Secondary | ICD-10-CM

## 2013-12-09 LAB — LIPID PANEL
CHOL/HDL RATIO: 3
Cholesterol: 166 mg/dL (ref 0–200)
HDL: 58.5 mg/dL (ref >39.00)
LDL Cholesterol: 93 mg/dL (ref 0–99)
NonHDL: 107.5
Triglycerides: 73 mg/dL (ref 0.0–149.0)
VLDL: 14.6 mg/dL (ref 0.0–40.0)

## 2013-12-09 LAB — HEPATIC FUNCTION PANEL
ALBUMIN: 3.5 g/dL (ref 3.5–5.2)
ALT: 21 U/L (ref 0–35)
AST: 16 U/L (ref 0–37)
Alkaline Phosphatase: 51 U/L (ref 39–117)
Bilirubin, Direct: 0.1 mg/dL (ref 0.0–0.3)
TOTAL PROTEIN: 6.5 g/dL (ref 6.0–8.3)
Total Bilirubin: 0.7 mg/dL (ref 0.2–1.2)

## 2013-12-09 LAB — TSH: TSH: 1.92 u[IU]/mL (ref 0.35–4.50)

## 2013-12-12 ENCOUNTER — Ambulatory Visit (INDEPENDENT_AMBULATORY_CARE_PROVIDER_SITE_OTHER): Payer: 59 | Admitting: Family Medicine

## 2013-12-12 ENCOUNTER — Encounter: Payer: Self-pay | Admitting: Family Medicine

## 2013-12-12 VITALS — BP 153/80 | HR 103 | Temp 98.7°F | Ht 60.0 in | Wt 170.0 lb

## 2013-12-12 DIAGNOSIS — R030 Elevated blood-pressure reading, without diagnosis of hypertension: Secondary | ICD-10-CM

## 2013-12-12 DIAGNOSIS — E039 Hypothyroidism, unspecified: Secondary | ICD-10-CM

## 2013-12-12 DIAGNOSIS — E785 Hyperlipidemia, unspecified: Secondary | ICD-10-CM

## 2013-12-12 NOTE — Progress Notes (Signed)
   Subjective:    Patient ID: Miranda Mcguire, female    DOB: 02/05/1946, 68 y.o.   MRN: [id number]  HPI Here to follow up. She feels well. They are leaving an a trip to [place] soon to visit family.    Review of Systems  Constitutional: Negative.   Respiratory: Negative.   Cardiovascular: Negative.        Objective:   Physical Exam  Constitutional: She appears well-developed and well-nourished.  Neck: No thyromegaly present.  Cardiovascular: Normal rate, regular rhythm, normal heart sounds and intact distal pulses.   Pulmonary/Chest: Effort normal and breath sounds normal.  Lymphadenopathy:    She has no cervical adenopathy.          Assessment & Plan:  Doing well recheck in 6 months

## 2013-12-12 NOTE — Progress Notes (Signed)
Pre visit review using our clinic review tool, if applicable. No additional management support is needed unless otherwise documented below in the visit note. 

## 2014-04-19 ENCOUNTER — Other Ambulatory Visit: Payer: Self-pay

## 2014-04-19 DIAGNOSIS — Z1231 Encounter for screening mammogram for malignant neoplasm of breast: Secondary | ICD-10-CM

## 2014-05-23 ENCOUNTER — Ambulatory Visit
Admission: RE | Admit: 2014-05-23 | Discharge: 2014-05-23 | Disposition: A | Discharge status: Home / Self Care | Payer: Medicare Other | Source: Ambulatory Visit

## 2014-05-23 ENCOUNTER — Encounter (INDEPENDENT_AMBULATORY_CARE_PROVIDER_SITE_OTHER): Payer: Self-pay

## 2014-05-23 DIAGNOSIS — Z1231 Encounter for screening mammogram for malignant neoplasm of breast: Secondary | ICD-10-CM

## 2014-05-29 DIAGNOSIS — H25013 Cortical age-related cataract, bilateral: Secondary | ICD-10-CM | POA: Diagnosis not present

## 2014-05-29 DIAGNOSIS — H35373 Puckering of macula, bilateral: Secondary | ICD-10-CM | POA: Diagnosis not present

## 2014-05-29 DIAGNOSIS — H40013 Open angle with borderline findings, low risk, bilateral: Secondary | ICD-10-CM | POA: Diagnosis not present

## 2014-05-29 DIAGNOSIS — H2513 Age-related nuclear cataract, bilateral: Secondary | ICD-10-CM | POA: Diagnosis not present

## 2014-06-26 ENCOUNTER — Other Ambulatory Visit (INDEPENDENT_AMBULATORY_CARE_PROVIDER_SITE_OTHER): Payer: Medicare Other

## 2014-06-26 DIAGNOSIS — D508 Other iron deficiency anemias: Secondary | ICD-10-CM | POA: Diagnosis not present

## 2014-06-26 DIAGNOSIS — E785 Hyperlipidemia, unspecified: Secondary | ICD-10-CM | POA: Diagnosis not present

## 2014-06-26 DIAGNOSIS — Z0000 Encounter for general adult medical examination without abnormal findings: Secondary | ICD-10-CM | POA: Diagnosis not present

## 2014-06-26 DIAGNOSIS — E038 Other specified hypothyroidism: Secondary | ICD-10-CM | POA: Diagnosis not present

## 2014-06-26 DIAGNOSIS — R300 Dysuria: Secondary | ICD-10-CM | POA: Diagnosis not present

## 2014-06-26 LAB — LIPID PANEL
Cholesterol: 166 mg/dL (ref 0–200)
HDL: 51.6 mg/dL (ref >39.00)
LDL Cholesterol: 95 mg/dL (ref 0–99)
NonHDL: 114.4
TRIGLYCERIDES: 98 mg/dL (ref 0.0–149.0)
Total CHOL/HDL Ratio: 3
VLDL: 19.6 mg/dL (ref 0.0–40.0)

## 2014-06-26 LAB — CBC WITH DIFFERENTIAL/PLATELET
BASOS ABS: 0 10*3/uL (ref 0.0–0.1)
BASOS PCT: 0.5 % (ref 0.0–3.0)
Eosinophils Absolute: 0.1 10*3/uL (ref 0.0–0.7)
Eosinophils Relative: 1.9 % (ref 0.0–5.0)
HCT: 37.7 % (ref 36.0–46.0)
Hemoglobin: 12.1 g/dL (ref 12.0–15.0)
LYMPHS ABS: 1.5 10*3/uL (ref 0.7–4.0)
Lymphocytes Relative: 26.4 % (ref 12.0–46.0)
MCHC: 32.1 g/dL (ref 30.0–36.0)
MCV: 84 fl (ref 78.0–100.0)
Monocytes Absolute: 0.4 10*3/uL (ref 0.1–1.0)
Monocytes Relative: 7.6 % (ref 3.0–12.0)
NEUTROS ABS: 3.7 10*3/uL (ref 1.4–7.7)
NEUTROS PCT: 63.6 % (ref 43.0–77.0)
PLATELETS: 165 10*3/uL (ref 150.0–400.0)
RBC: 4.49 Mil/uL (ref 3.87–5.11)
RDW: 12.7 % (ref 11.5–15.5)
WBC: 5.8 10*3/uL (ref 4.0–10.5)

## 2014-06-26 LAB — COMPREHENSIVE METABOLIC PANEL
ALT: 23 U/L (ref 0–35)
AST: 18 U/L (ref 0–37)
Albumin: 3.4 g/dL — ABNORMAL LOW (ref 3.5–5.2)
Alkaline Phosphatase: 52 U/L (ref 39–117)
BILIRUBIN TOTAL: 1 mg/dL (ref 0.2–1.2)
BUN: 14 mg/dL (ref 6–23)
CHLORIDE: 105 meq/L (ref 96–112)
CO2: 28 meq/L (ref 19–32)
CREATININE: 0.7 mg/dL (ref 0.4–1.2)
Calcium: 8.6 mg/dL (ref 8.4–10.5)
GFR: 83.72 mL/min (ref >60.00)
Glucose, Bld: 100 mg/dL — ABNORMAL HIGH (ref 70–99)
Potassium: 4.3 mEq/L (ref 3.5–5.1)
Sodium: 139 mEq/L (ref 135–145)
Total Protein: 6.4 g/dL (ref 6.0–8.3)

## 2014-06-26 LAB — POCT URINALYSIS DIPSTICK
BILIRUBIN UA: NEGATIVE
Glucose, UA: NEGATIVE
Ketones, UA: NEGATIVE
NITRITE UA: NEGATIVE
Protein, UA: NEGATIVE
RBC UA: NEGATIVE
Spec Grav, UA: 1.02
Urobilinogen, UA: 0.2
pH, UA: 5.5

## 2014-06-26 NOTE — Addendum Note (Signed)
Addended by: Elmer Picker on: 06/26/2014 10:02 AM   Modules accepted: Orders

## 2014-06-27 LAB — TSH: TSH: 1.91 u[IU]/mL (ref 0.35–4.50)

## 2014-06-28 LAB — URINE CULTURE
COLONY COUNT: NO GROWTH
Organism ID, Bacteria: NO GROWTH

## 2014-07-04 ENCOUNTER — Encounter: Payer: Self-pay | Admitting: Family Medicine

## 2014-07-04 ENCOUNTER — Ambulatory Visit (INDEPENDENT_AMBULATORY_CARE_PROVIDER_SITE_OTHER): Payer: Medicare Other | Admitting: Family Medicine

## 2014-07-04 VITALS — BP 149/76 | HR 96 | Temp 99.2°F | Ht 60.0 in | Wt 171.0 lb

## 2014-07-04 DIAGNOSIS — D538 Other specified nutritional anemias: Secondary | ICD-10-CM

## 2014-07-04 DIAGNOSIS — Z23 Encounter for immunization: Secondary | ICD-10-CM

## 2014-07-04 DIAGNOSIS — R252 Cramp and spasm: Secondary | ICD-10-CM | POA: Diagnosis not present

## 2014-07-04 DIAGNOSIS — R030 Elevated blood-pressure reading, without diagnosis of hypertension: Secondary | ICD-10-CM | POA: Diagnosis not present

## 2014-07-04 DIAGNOSIS — E785 Hyperlipidemia, unspecified: Secondary | ICD-10-CM

## 2014-07-04 DIAGNOSIS — E038 Other specified hypothyroidism: Secondary | ICD-10-CM

## 2014-07-04 NOTE — Progress Notes (Signed)
   Subjective:    Patient ID: Miranda Mcguire, female    DOB: [birth year], 68 y.o.   MRN: [id number]  HPI 68 yr old female for a cpx. She feels well except she has some calf cramps at night from time to time. They only last a few minutes. No swelling. No chest pain or SOB. She walks daily with her husband. Tomorrow is her last day at work and then she will retire.    Review of Systems  Constitutional: Negative.   HENT: Negative.   Eyes: Negative.   Respiratory: Negative.   Cardiovascular: Negative.   Gastrointestinal: Negative.   Genitourinary: Negative for dysuria, urgency, frequency, hematuria, flank pain, decreased urine volume, enuresis, difficulty urinating, pelvic pain and dyspareunia.  Musculoskeletal: Negative.   Skin: Negative.   Neurological: Negative.   Psychiatric/Behavioral: Negative.        Objective:   Physical Exam  Constitutional: She is oriented to person, place, and time. She appears well-developed and well-nourished. No distress.  HENT:  Head: Normocephalic and atraumatic.  Right Ear: External ear normal.  Left Ear: External ear normal.  Nose: Nose normal.  Mouth/Throat: Oropharynx is clear and moist. No oropharyngeal exudate.  Eyes: Conjunctivae and EOM are normal. Pupils are equal, round, and reactive to light. No scleral icterus.  Neck: Normal range of motion. Neck supple. No JVD present. No thyromegaly present.  Cardiovascular: Normal rate, regular rhythm, normal heart sounds and intact distal pulses.  Exam reveals no gallop and no friction rub.   No murmur heard. EKG normal   Pulmonary/Chest: Effort normal and breath sounds normal. No respiratory distress. She has no wheezes. She has no rales. She exhibits no tenderness.  Abdominal: Soft. Bowel sounds are normal. She exhibits no distension and no mass. There is no tenderness. There is no rebound and no guarding.  Musculoskeletal: Normal range of motion. She exhibits no edema or tenderness.    Lymphadenopathy:    She has no cervical adenopathy.  Neurological: She is alert and oriented to person, place, and time. She has normal reflexes. No cranial nerve deficit. She exhibits normal muscle tone. Coordination normal.  Skin: Skin is warm and dry. No rash noted. No erythema.  Psychiatric: She has a normal mood and affect. Her behavior is normal. Judgment and thought content normal.          Assessment & Plan:  She is doing well in general. She is past due for a colonoscopy so she will contact Dr. Ardis Hughs' office for this. Try taking an OTC magnesium supplement daily for the leg cramps, or try drinking a glass of tonic water at bedtime. Given a shingle shot today. In January we plan to give her her second pneumonia vaccine.

## 2014-07-04 NOTE — Addendum Note (Signed)
Addended by: Aggie Hacker A on: 07/04/2014 05:46 PM   Modules accepted: Orders

## 2014-07-04 NOTE — Progress Notes (Signed)
Pre visit review using our clinic review tool, if applicable. No additional management support is needed unless otherwise documented below in the visit note. 

## 2014-07-24 ENCOUNTER — Ambulatory Visit (INDEPENDENT_AMBULATORY_CARE_PROVIDER_SITE_OTHER): Payer: Medicare Other | Admitting: Family Medicine

## 2014-07-24 DIAGNOSIS — Z23 Encounter for immunization: Secondary | ICD-10-CM

## 2014-08-03 ENCOUNTER — Ambulatory Visit: Payer: Medicare Other | Admitting: Family Medicine

## 2014-09-03 ENCOUNTER — Other Ambulatory Visit: Payer: Self-pay | Admitting: Family Medicine

## 2014-11-10 ENCOUNTER — Telehealth: Payer: Self-pay | Admitting: Family Medicine

## 2014-11-10 DIAGNOSIS — E039 Hypothyroidism, unspecified: Secondary | ICD-10-CM

## 2014-11-10 DIAGNOSIS — E785 Hyperlipidemia, unspecified: Secondary | ICD-10-CM

## 2014-11-10 NOTE — Telephone Encounter (Signed)
Labs are ordered 

## 2014-11-10 NOTE — Telephone Encounter (Signed)
Pt states she and her husband come for their 6 mo fup and are always allowed to do labs prior to this appt. It is ok to schedule labs prior to the 6 mo fup?

## 2014-11-13 ENCOUNTER — Telehealth: Payer: Self-pay

## 2014-11-13 ENCOUNTER — Other Ambulatory Visit: Payer: Medicare Other

## 2014-11-13 ENCOUNTER — Encounter (HOSPITAL_COMMUNITY): Payer: Self-pay

## 2014-11-13 ENCOUNTER — Observation Stay (HOSPITAL_COMMUNITY)
Admission: EM | Admit: 2014-11-13 | Discharge: 2014-11-13 | Disposition: A | Discharge status: Other Acute Inpatient Hospital | Payer: Medicare Other | Attending: Family Medicine | Admitting: Family Medicine

## 2014-11-13 ENCOUNTER — Encounter: Payer: Self-pay | Admitting: Family Medicine

## 2014-11-13 ENCOUNTER — Ambulatory Visit (INDEPENDENT_AMBULATORY_CARE_PROVIDER_SITE_OTHER): Payer: Medicare Other | Admitting: Family Medicine

## 2014-11-13 VITALS — BP 150/80 | HR 105 | Temp 99.1°F | Ht 60.0 in | Wt 172.0 lb

## 2014-11-13 DIAGNOSIS — T148XXA Other injury of unspecified body region, initial encounter: Secondary | ICD-10-CM

## 2014-11-13 DIAGNOSIS — J9811 Atelectasis: Secondary | ICD-10-CM | POA: Diagnosis not present

## 2014-11-13 DIAGNOSIS — D649 Anemia, unspecified: Secondary | ICD-10-CM | POA: Diagnosis not present

## 2014-11-13 DIAGNOSIS — R229 Localized swelling, mass and lump, unspecified: Secondary | ICD-10-CM | POA: Diagnosis not present

## 2014-11-13 DIAGNOSIS — E669 Obesity, unspecified: Secondary | ICD-10-CM | POA: Diagnosis present

## 2014-11-13 DIAGNOSIS — K5900 Constipation, unspecified: Secondary | ICD-10-CM | POA: Diagnosis not present

## 2014-11-13 DIAGNOSIS — E039 Hypothyroidism, unspecified: Secondary | ICD-10-CM

## 2014-11-13 DIAGNOSIS — J811 Chronic pulmonary edema: Secondary | ICD-10-CM | POA: Diagnosis not present

## 2014-11-13 DIAGNOSIS — R5081 Fever presenting with conditions classified elsewhere: Secondary | ICD-10-CM | POA: Diagnosis not present

## 2014-11-13 DIAGNOSIS — E785 Hyperlipidemia, unspecified: Secondary | ICD-10-CM

## 2014-11-13 DIAGNOSIS — R0600 Dyspnea, unspecified: Secondary | ICD-10-CM | POA: Diagnosis not present

## 2014-11-13 DIAGNOSIS — J90 Pleural effusion, not elsewhere classified: Secondary | ICD-10-CM | POA: Diagnosis not present

## 2014-11-13 DIAGNOSIS — R000 Tachycardia, unspecified: Secondary | ICD-10-CM | POA: Diagnosis not present

## 2014-11-13 DIAGNOSIS — Z823 Family history of stroke: Secondary | ICD-10-CM | POA: Diagnosis not present

## 2014-11-13 DIAGNOSIS — R509 Fever, unspecified: Secondary | ICD-10-CM | POA: Diagnosis not present

## 2014-11-13 DIAGNOSIS — M810 Age-related osteoporosis without current pathological fracture: Secondary | ICD-10-CM | POA: Diagnosis not present

## 2014-11-13 DIAGNOSIS — D61810 Antineoplastic chemotherapy induced pancytopenia: Secondary | ICD-10-CM | POA: Diagnosis not present

## 2014-11-13 DIAGNOSIS — J9621 Acute and chronic respiratory failure with hypoxia: Secondary | ICD-10-CM | POA: Diagnosis not present

## 2014-11-13 DIAGNOSIS — R197 Diarrhea, unspecified: Secondary | ICD-10-CM | POA: Diagnosis not present

## 2014-11-13 DIAGNOSIS — E873 Alkalosis: Secondary | ICD-10-CM | POA: Diagnosis not present

## 2014-11-13 DIAGNOSIS — Z6833 Body mass index (BMI) 33.0-33.9, adult: Secondary | ICD-10-CM | POA: Diagnosis not present

## 2014-11-13 DIAGNOSIS — R0902 Hypoxemia: Secondary | ICD-10-CM | POA: Diagnosis not present

## 2014-11-13 DIAGNOSIS — J9601 Acute respiratory failure with hypoxia: Secondary | ICD-10-CM | POA: Diagnosis not present

## 2014-11-13 DIAGNOSIS — I4581 Long QT syndrome: Secondary | ICD-10-CM | POA: Diagnosis not present

## 2014-11-13 DIAGNOSIS — D709 Neutropenia, unspecified: Secondary | ICD-10-CM | POA: Diagnosis not present

## 2014-11-13 DIAGNOSIS — C9200 Acute myeloblastic leukemia, not having achieved remission: Secondary | ICD-10-CM | POA: Diagnosis not present

## 2014-11-13 DIAGNOSIS — L905 Scar conditions and fibrosis of skin: Secondary | ICD-10-CM | POA: Diagnosis not present

## 2014-11-13 DIAGNOSIS — E538 Deficiency of other specified B group vitamins: Secondary | ICD-10-CM | POA: Diagnosis not present

## 2014-11-13 DIAGNOSIS — D6959 Other secondary thrombocytopenia: Secondary | ICD-10-CM | POA: Diagnosis present

## 2014-11-13 DIAGNOSIS — J810 Acute pulmonary edema: Secondary | ICD-10-CM | POA: Diagnosis not present

## 2014-11-13 DIAGNOSIS — R310 Gross hematuria: Secondary | ICD-10-CM | POA: Diagnosis not present

## 2014-11-13 DIAGNOSIS — C9500 Acute leukemia of unspecified cell type not having achieved remission: Secondary | ICD-10-CM

## 2014-11-13 DIAGNOSIS — R319 Hematuria, unspecified: Secondary | ICD-10-CM | POA: Diagnosis not present

## 2014-11-13 DIAGNOSIS — T451X5A Adverse effect of antineoplastic and immunosuppressive drugs, initial encounter: Secondary | ICD-10-CM | POA: Diagnosis not present

## 2014-11-13 DIAGNOSIS — E876 Hypokalemia: Secondary | ICD-10-CM | POA: Diagnosis not present

## 2014-11-13 DIAGNOSIS — D696 Thrombocytopenia, unspecified: Principal | ICD-10-CM | POA: Diagnosis present

## 2014-11-13 DIAGNOSIS — Z79899 Other long term (current) drug therapy: Secondary | ICD-10-CM | POA: Diagnosis not present

## 2014-11-13 DIAGNOSIS — E8770 Fluid overload, unspecified: Secondary | ICD-10-CM | POA: Diagnosis not present

## 2014-11-13 DIAGNOSIS — R9431 Abnormal electrocardiogram [ECG] [EKG]: Secondary | ICD-10-CM | POA: Diagnosis not present

## 2014-11-13 DIAGNOSIS — R0602 Shortness of breath: Secondary | ICD-10-CM | POA: Diagnosis not present

## 2014-11-13 DIAGNOSIS — Z87891 Personal history of nicotine dependence: Secondary | ICD-10-CM | POA: Insufficient documentation

## 2014-11-13 DIAGNOSIS — I10 Essential (primary) hypertension: Secondary | ICD-10-CM | POA: Insufficient documentation

## 2014-11-13 DIAGNOSIS — H53411 Scotoma involving central area, right eye: Secondary | ICD-10-CM | POA: Diagnosis present

## 2014-11-13 DIAGNOSIS — M793 Panniculitis, unspecified: Secondary | ICD-10-CM | POA: Diagnosis not present

## 2014-11-13 DIAGNOSIS — M1990 Unspecified osteoarthritis, unspecified site: Secondary | ICD-10-CM | POA: Insufficient documentation

## 2014-11-13 DIAGNOSIS — R110 Nausea: Secondary | ICD-10-CM | POA: Diagnosis not present

## 2014-11-13 DIAGNOSIS — T148 Other injury of unspecified body region: Secondary | ICD-10-CM

## 2014-11-13 DIAGNOSIS — J939 Pneumothorax, unspecified: Secondary | ICD-10-CM | POA: Diagnosis not present

## 2014-11-13 DIAGNOSIS — R609 Edema, unspecified: Secondary | ICD-10-CM | POA: Diagnosis not present

## 2014-11-13 DIAGNOSIS — C9201 Acute myeloblastic leukemia, in remission: Secondary | ICD-10-CM | POA: Diagnosis not present

## 2014-11-13 DIAGNOSIS — R601 Generalized edema: Secondary | ICD-10-CM | POA: Diagnosis not present

## 2014-11-13 LAB — CBC WITH DIFFERENTIAL/PLATELET
Basophils Absolute: 0 10*3/uL (ref 0.0–0.1)
Basophils Relative: 0.4 % (ref 0.0–3.0)
EOS ABS: 0 10*3/uL (ref 0.0–0.7)
EOS PCT: 0 % (ref 0.0–5.0)
HCT: 26.1 % — ABNORMAL LOW (ref 36.0–46.0)
Hemoglobin: 9 g/dL — ABNORMAL LOW (ref 12.0–15.0)
LYMPHS PCT: 35.5 % (ref 12.0–46.0)
Lymphs Abs: 1.8 10*3/uL (ref 0.7–4.0)
MCHC: 34.6 g/dL (ref 30.0–36.0)
MCV: 85.1 fl (ref 78.0–100.0)
Monocytes Absolute: 3 10*3/uL — ABNORMAL HIGH (ref 0.1–1.0)
Monocytes Relative: 57.5 % — ABNORMAL HIGH (ref 3.0–12.0)
NEUTROS ABS: 0.3 10*3/uL — AB (ref 1.4–7.7)
NEUTROS PCT: 6.6 % — AB (ref 43.0–77.0)
Platelets: 13 10*3/uL — CL (ref 150.0–400.0)
RBC: 3.07 Mil/uL — ABNORMAL LOW (ref 3.87–5.11)
RDW: 15.8 % — ABNORMAL HIGH (ref 11.5–15.5)

## 2014-11-13 LAB — LIPID PANEL
Cholesterol: 151 mg/dL (ref 0–200)
HDL: 49.9 mg/dL (ref >39.00)
LDL CALC: 68 mg/dL (ref 0–99)
NONHDL: 101.1
Total CHOL/HDL Ratio: 3
Triglycerides: 168 mg/dL — ABNORMAL HIGH (ref 0.0–149.0)
VLDL: 33.6 mg/dL (ref 0.0–40.0)

## 2014-11-13 LAB — POCT URINALYSIS DIPSTICK
BILIRUBIN UA: NEGATIVE
Glucose, UA: NEGATIVE
KETONES UA: NEGATIVE
Leukocytes, UA: NEGATIVE
Nitrite, UA: NEGATIVE
PH UA: 5
SPEC GRAV UA: 1.025
Urobilinogen, UA: 0.2

## 2014-11-13 LAB — BASIC METABOLIC PANEL
BUN: 21 mg/dL (ref 6–23)
CO2: 28 meq/L (ref 19–32)
CREATININE: 1.04 mg/dL (ref 0.40–1.20)
Calcium: 9.2 mg/dL (ref 8.4–10.5)
Chloride: 106 mEq/L (ref 96–112)
GFR: 55.59 mL/min — ABNORMAL LOW (ref >60.00)
GLUCOSE: 109 mg/dL — AB (ref 70–99)
Potassium: 4.2 mEq/L (ref 3.5–5.1)
Sodium: 140 mEq/L (ref 135–145)

## 2014-11-13 LAB — HEPATIC FUNCTION PANEL
ALBUMIN: 3.9 g/dL (ref 3.5–5.2)
ALT: 26 U/L (ref 0–35)
AST: 20 U/L (ref 0–37)
Alkaline Phosphatase: 51 U/L (ref 39–117)
Bilirubin, Direct: 0.1 mg/dL (ref 0.0–0.3)
Total Bilirubin: 0.8 mg/dL (ref 0.2–1.2)
Total Protein: 6.9 g/dL (ref 6.0–8.3)

## 2014-11-13 LAB — FOLATE: Folate: 60 ng/mL (ref >5.9)

## 2014-11-13 LAB — TYPE AND SCREEN
ABO/RH(D): A POS
Antibody Screen: NEGATIVE

## 2014-11-13 LAB — IRON AND TIBC
Iron: 133 ug/dL (ref 28–170)
Saturation Ratios: 41 % — ABNORMAL HIGH (ref 10.4–31.8)
TIBC: 326 ug/dL (ref 250–450)
UIBC: 193 ug/dL

## 2014-11-13 LAB — PROTIME-INR
INR: 1.1 ratio — ABNORMAL HIGH (ref 0.8–1.0)
PROTHROMBIN TIME: 11.7 s (ref 9.6–13.1)

## 2014-11-13 LAB — RETICULOCYTES
RBC.: 3.08 MIL/uL — ABNORMAL LOW (ref 3.87–5.11)
Retic Count, Absolute: 30.8 10*3/uL (ref 19.0–186.0)
Retic Ct Pct: 1 % (ref 0.4–3.1)

## 2014-11-13 LAB — DIC (DISSEMINATED INTRAVASCULAR COAGULATION)PANEL
D-Dimer, Quant: 3.53 ug/mL-FEU — ABNORMAL HIGH (ref 0.00–0.48)
Prothrombin Time: 13.6 seconds (ref 11.6–15.2)
Smear Review: NONE SEEN

## 2014-11-13 LAB — DIC (DISSEMINATED INTRAVASCULAR COAGULATION) PANEL (NOT AT ARMC)
Fibrinogen: 307 mg/dL (ref 204–475)
INR: 1.03 (ref 0.00–1.49)
Platelets: 13 10*3/uL — CL (ref 150–400)
aPTT: 28 seconds (ref 24–37)

## 2014-11-13 LAB — RAPID HIV SCREEN (HIV 1/2 AB+AG)
HIV 1/2 Antibodies: NONREACTIVE
HIV-1 P24 ANTIGEN - HIV24: NONREACTIVE

## 2014-11-13 LAB — FERRITIN: Ferritin: 438 ng/mL — ABNORMAL HIGH (ref 11–307)

## 2014-11-13 LAB — LACTATE DEHYDROGENASE: LDH: 357 U/L — ABNORMAL HIGH (ref 98–192)

## 2014-11-13 LAB — TSH: TSH: 1.99 u[IU]/mL (ref 0.35–4.50)

## 2014-11-13 LAB — ABO/RH: ABO/RH(D): A POS

## 2014-11-13 LAB — APTT: aPTT: 27.2 s (ref 23.4–32.7)

## 2014-11-13 LAB — VITAMIN B12: Vitamin B-12: 401 pg/mL (ref 180–914)

## 2014-11-13 NOTE — H&P (Signed)
[facility] History and Physical  Miranda Mcguire YQM:[id number] DOB: 11-Feb-1946 DOA: 11/13/2014  Referring physician: Dr. Wilson Singer PCP: Laurey Morale, MD   Chief Complaint: Bleeding gums and easy bruising  HPI: Miranda Mcguire is a 68 y.o. female  With history of hypothyroidism and hyperlipidemia who presented with 2 weeks complaints of easy bruising and bleeding gums when brushing teeth. The problem has been persistent since onset. Patient unsure if condition getting worse but has been about the same since onset. Nothing she is aware of makes it better. She initially presented to her dentist who prescribed Magic mouthwash of which this did not improve her condition. She subsequently went to her primary care physician drew blood work and found patient to have a low platelet count. Patient denies any sick contacts or recent fevers. Denies any blood in her urine or stools.  Given thrombocytopenia oncologist/hematologist was consulted by ED personnel and we were consulted for further evaluation and recommendations.   Review of Systems:  Constitutional:  No weight loss, night sweats, Fevers, chills, fatigue.  HEENT:  No headaches, Difficulty swallowing,Tooth/dental problems,Sore throat,  No sneezing, itching, ear ache, nasal congestion, post nasal drip,  Cardio-vascular:  No chest pain, Orthopnea, PND, swelling in lower extremities, anasarca, dizziness, palpitations  GI:  No heartburn, indigestion, abdominal pain, nausea, vomiting, diarrhea, change in bowel habits, loss of appetite  Resp:  No shortness of breath with exertion or at rest. No excess mucus, no productive cough, No non-productive cough, No coughing up of blood.No change in color of mucus.No wheezing.No chest wall deformity  Skin:  + Easy bruising rash or lesions.  GU:  no dysuria, change in color of urine, no urgency or frequency. No flank pain.  Musculoskeletal:  No joint pain or swelling. No decreased range of  motion. No back pain.  Psych:  No change in mood or affect. No depression or anxiety. No memory loss.   Past Medical History  Diagnosis Date  . Allergy   . Anemia     nos  . Hyperlipidemia   . Hypertension   . OA (osteoarthritis)   . OP (osteoporosis)     last dexa 08-15-08  . Endometrial polyp     post-menopausal bleeding   Past Surgical History  Procedure Laterality Date  . Tonsilectomy, adenoidectomy, bilateral myringotomy and tubes    . Herniated disc      L4-5 per Dr Saintclair Halsted  . Endometrial polypectomy      with D and C 03-08-09 pr dr Gardenia Phlegm  . Colonoscopy  02-14-08    per Dr. Ardis Hughs, repeat in 5 yrs  . Dilation and curettage of uterus     Social History:  reports that she has quit smoking. She has never used smokeless tobacco. She reports that she does not drink alcohol or use illicit drugs.  Allergies  Allergen Reactions  . Aspirin Hives  . Penicillins Other (See Comments)    Gas pocket     Family History  Problem Relation Age of Onset  . Stroke Other     Prior to Admission medications   Medication Sig Start Date End Date Taking? Authorizing Provider  atorvastatin (LIPITOR) 20 MG tablet TAKE 1 TABLET (20 MG TOTAL) BY MOUTH DAILY. 09/04/14  Yes Laurey Morale, MD  B Complex Vitamins (VITAMIN B COMPLEX PO) Take 1 tablet by mouth daily.    Yes Historical Provider, MD  CALCIUM-VITAMIN D PO Take 1 tablet by mouth daily.    Yes Historical Provider, MD  fish oil-omega-3  fatty acids 1000 MG capsule Take 1 g by mouth daily.     Yes Historical Provider, MD  Garlic TABS Take 1 tablet by mouth daily.    Yes Historical Provider, MD  levothyroxine (SYNTHROID, LEVOTHROID) 50 MCG tablet TAKE 1 TABLET (50 MCG TOTAL) BY MOUTH DAILY. 09/04/14  Yes Laurey Morale, MD  MAGNESIUM PO Take 1 tablet by mouth daily.   Yes Historical Provider, MD  Multiple Vitamins-Minerals (ICAPS PO) Take 1 tablet by mouth daily. macavue   Yes Historical Provider, MD  multivitamin [facility]) per tablet Take 1  tablet by mouth daily.     Yes Historical Provider, MD  nabumetone (RELAFEN) 750 MG tablet TAKE 1 TABLET (750 MG TOTAL) BY MOUTH 2 (TWO) TIMES DAILY. 09/04/14  Yes Laurey Morale, MD   Physical Exam: Filed Vitals:   11/13/14 1728 11/13/14 1730 11/13/14 1800 11/13/14 1845  BP: 161/66 153/65 135/63 151/98  Pulse: 83 83 80 82  Temp:      TempSrc:      Resp: 18 19 16 18   SpO2: 98% 97% 98% 98%    Wt Readings from Last 3 Encounters:  11/13/14 78.019 kg (172 lb)  07/04/14 77.565 kg (171 lb)  12/12/13 77.111 kg (170 lb)    General:  Appears calm and comfortable Eyes: PERRL, normal lids, irises & conjunctiva, mild conjunctival pallor ENT: grossly normal hearing, lips & tongue Neck: no LAD, masses or thyromegaly Cardiovascular: RRR, no m/r/g. No LE edema. Respiratory: CTA bilaterally, no w/r/r. Normal respiratory effort. Abdomen: soft, nt, nd Skin: Some bruising at her right arm and left torso. Otherwise no other rashes identified Musculoskeletal: grossly normal tone BUE/BLE Psychiatric: grossly normal mood and affect, speech fluent and appropriate Neurologic: Answers questions appropriately, moves extremities equally          Labs on Admission:  Basic Metabolic Panel:  Recent Labs Lab 11/13/14 0948  NA 140  K 4.2  CL 106  CO2 28  GLUCOSE 109*  BUN 21  CREATININE 1.04  CALCIUM 9.2   Liver Function Tests:  Recent Labs Lab 11/13/14 0953  AST 20  ALT 26  ALKPHOS 51  BILITOT 0.8  PROT 6.9  ALBUMIN 3.9   No results for input(s): LIPASE, AMYLASE in the last 168 hours. No results for input(s): AMMONIA in the last 168 hours. CBC:  Recent Labs Lab 11/13/14 0948 11/13/14 1749  WBC 5.2 abnormal cells  noted on smear referred for pathology review. 6.2  NEUTROABS 0.3* PENDING  HGB 9.0* 9.1*  HCT 26.1 Repeated and verified X2.* 27.7*  MCV 85.1 89.4  PLT 13.0 cL* 13*   Cardiac Enzymes: No results for input(s): CKTOTAL, CKMB, CKMBINDEX, TROPONINI in the last 168  hours.  BNP (last 3 results) No results for input(s): BNP in the last 8760 hours.  ProBNP (last 3 results) No results for input(s): PROBNP in the last 8760 hours.  CBG: No results for input(s): GLUCAP in the last 168 hours.  Radiological Exams on Admission: No results found.   Assessment/Plan   Thrombocytopenia - Principal problem will obtain anemia panel and DIC panel - We'll also place order for peripheral smear - Oncologist has been consulted by ED physician. - Telemetry monitoring. Most recent CBC drawn at 11/13/2014 at 1749 currently not reporting platelet count. If less than 5000 was strongly recommend transfusing platelets. Would recommend results be called in to oncologist for further evaluation recommendations  Active Problems:   Hypothyroidism - Stable continue Synthroid    Hyperlipidemia -  Stable continue statin    Code Status: Full DVT Prophylaxis: SCDs avoiding heparin products due to thrombocytopenia Family Communication: Discussed with spouse at bedside Disposition Plan: Pending further recommendations from oncologist  Time spent: > 40 minutes  Velvet Bathe [facility] Pager [phone]

## 2014-11-13 NOTE — Consult Note (Addendum)
[place]  Telephone:([phone] Fax:(336) [place] Note   Patient Care Team: Laurey Morale, MD as PCP - General 11/13/2014  CHIEF COMPLAINTS/PURPOSE OF CONSULTATION:  Anemia and thrombocytopenia   HISTORY OF PRESENTING ILLNESS:  Miranda Mcguire 69 y.o. female with acute onset of anemia and thrombocytopenia. I was called by [facility] to see the patient as an urgent consult.  She has been having some easy bruising on her arms, next and trunk, and gum bleeding from brushing for a few weeks. She was seen by her dentist a week ago and was giving Magic mouthwash. Her symptoms did not improve, she went to see her primary care physician today. CBC showed WBC 5.2, hemoglobin 9.0, hematocrit 26.1, platelet count 13 K. WBC differential showed neutrophil 6.6%, and accountant abnormal cells. She was sent to [facility] emergency room by her primary care physician.  She reports moderate fatigue, able to do all daily activities. She denies any fever, chills or night sweats. No significant weight loss. She denies any personal history of cancer or malignancy.  MEDICAL HISTORY:  Past Medical History  Diagnosis Date  . Allergy   . Anemia     nos  . Hyperlipidemia   . Hypertension   . OA (osteoarthritis)   . OP (osteoporosis)     last dexa 08-15-08  . Endometrial polyp     post-menopausal bleeding    SURGICAL HISTORY: Past Surgical History  Procedure Laterality Date  . Tonsilectomy, adenoidectomy, bilateral myringotomy and tubes    . Herniated disc      L4-5 per Dr Saintclair Halsted  . Endometrial polypectomy      with D and C 03-08-09 pr dr Gardenia Phlegm  . Colonoscopy  02-14-08    per Dr. Ardis Hughs, repeat in 5 yrs  . Dilation and curettage of uterus      SOCIAL HISTORY: History   Social History  . Marital Status: Married    Spouse Name: N/A  . Number of Children: N/A  . Years of Education: N/A   Occupational History  . Not on file.   Social History Main Topics    . Smoking status: Former Research scientist (life sciences)  . Smokeless tobacco: Never Used  . Alcohol Use: No  . Drug Use: No  . Sexual Activity: Not on file   Other Topics Concern  . Not on file   Social History Narrative    FAMILY HISTORY: Family History  Problem Relation Age of Onset  . Stroke Other     ALLERGIES:  is allergic to aspirin and penicillins.  MEDICATIONS:  No current facility-administered medications for this encounter.   Current Outpatient Prescriptions  Medication Sig Dispense Refill  . atorvastatin (LIPITOR) 20 MG tablet TAKE 1 TABLET (20 MG TOTAL) BY MOUTH DAILY. 90 tablet 2  . B Complex Vitamins (VITAMIN B COMPLEX PO) Take 1 tablet by mouth daily.     Marland Kitchen CALCIUM-VITAMIN D PO Take 1 tablet by mouth daily.     . fish oil-omega-3 fatty acids 1000 MG capsule Take 1 g by mouth daily.      . Garlic TABS Take 1 tablet by mouth daily.     Marland Kitchen levothyroxine (SYNTHROID, LEVOTHROID) 50 MCG tablet TAKE 1 TABLET (50 MCG TOTAL) BY MOUTH DAILY. 90 tablet 2  . MAGNESIUM PO Take 1 tablet by mouth daily.    . Multiple Vitamins-Minerals (ICAPS PO) Take 1 tablet by mouth daily. macavue    . multivitamin (THERAGRAN) per tablet Take 1 tablet  by mouth daily.      . nabumetone (RELAFEN) 750 MG tablet TAKE 1 TABLET (750 MG TOTAL) BY MOUTH 2 (TWO) TIMES DAILY. 180 tablet 2    REVIEW OF SYSTEMS:   Constitutional: Denies fevers, chills or abnormal night sweats Eyes: Denies blurriness of vision, double vision or watery eyes Ears, nose, mouth, throat, and face: Denies mucositis or sore throat Respiratory: Denies cough, dyspnea or wheezes Cardiovascular: Denies palpitation, chest discomfort or lower extremity swelling Gastrointestinal:  Denies nausea, heartburn or change in bowel habits Skin: Denies abnormal skin rashes Lymphatics: Denies new lymphadenopathy or easy bruising Neurological:Denies numbness, tingling or new weaknesses Behavioral/Psych: Mood is stable, no new changes  All other systems were  reviewed with the patient and are negative.  PHYSICAL EXAMINATION: ECOG PERFORMANCE STATUS: 1 - Symptomatic but completely ambulatory  Filed Vitals:   11/13/14 1845  BP: 151/98  Pulse: 82  Temp:   Resp: 18   There were no vitals filed for this visit.  GENERAL:alert, no distress and comfortable SKIN: skin color, texture, turgor are normal, no rashes or significant lesions EYES: normal, conjunctiva are pink and non-injected, sclera clear OROPHARYNX:no exudate, no erythema and lips, buccal mucosa, and tongue normal  NECK: supple, thyroid normal size, non-tender, without nodularity LYMPH:  no palpable lymphadenopathy in the cervical, axillary or inguinal LUNGS: clear to auscultation and percussion with normal breathing effort HEART: regular rate & rhythm and no murmurs and no lower extremity edema ABDOMEN:abdomen soft, non-tender and normal bowel sounds Musculoskeletal:no cyanosis of digits and no clubbing  PSYCH: alert & oriented x 3 with fluent speech NEURO: no focal motor/sensory deficits  LABORATORY DATA:  I have reviewed the data as listed Lab Results  Component Value Date   WBC 6.2 11/13/2014   HGB 9.1* 11/13/2014   HCT 27.7* 11/13/2014   MCV 89.4 11/13/2014   PLT 13* 11/13/2014    Recent Labs  12/09/13 0911 06/26/14 0901 11/13/14 0948 11/13/14 0953  NA  --  139 140  --   K  --  4.3 4.2  --   CL  --  105 106  --   CO2  --  28 28  --   GLUCOSE  --  100* 109*  --   BUN  --  14 21  --   CREATININE  --  0.7 1.04  --   CALCIUM  --  8.6 9.2  --   PROT 6.5 6.4  --  6.9  ALBUMIN 3.5 3.4*  --  3.9  AST 16 18  --  20  ALT 21 23  --  26  ALKPHOS 51 52  --  51  BILITOT 0.7 1.0  --  0.8  BILIDIR 0.1  --   --  0.1    RADIOGRAPHIC STUDIES: I have personally reviewed the radiological images as listed and agreed with the findings in the report. No results found.  ASSESSMENT & PLAN:  69 year old Caucasian female, presented with mucosal bleeding, CBC showed moderate  anemia and thrombocytopenia with plt 13K. her last CBC on 06/26/2014 was normal.  I reviewed her peripheral blood smear, it shows a prominent blasts cells, likely more than 20%, significant low plt number, RBC morphology is unremarkable, no significant schistocytes. Her PT and APTT were normal, other labs are still pending. No lab evidence of DIC. This is most consistent with acute leukemia, also we need bone marrow biopsy and flow to confirm.  I discussed the above findings with patient and her husband.  Because we do not treat acute leukemia in our hospital, I recommend her to be transferred to [facility]. She agrees. Her husband cannot go with her tonight because they have a dog at home, and is little concerned about how get to [facility] tomorrow.   I called [facility] center, and spoke with the on-call hematology/oncology attending. Her transfer was accepted and she will be going to their leukemia floor directly from here.   Truitt Merle   Orders Placed This Encounter  Procedures  . Lactate dehydrogenase    Standing Status: Standing     Number of Occurrences: 1     Standing Expiration Date:   . Rapid HIV screen (HIV 1/2 Ab+Ag)    Standing Status: Standing     Number of Occurrences: 1     Standing Expiration Date:   . CBC with Differential    Standing Status: Standing     Number of Occurrences: 1     Standing Expiration Date:   . DIC panel    Standing Status: Standing     Number of Occurrences: 1     Standing Expiration Date:   Marland Kitchen Vitamin B12    Standing Status: Standing     Number of Occurrences: 1     Standing Expiration Date:   . Folate    Standing Status: Standing     Number of Occurrences: 1     Standing Expiration Date:   . Iron and TIBC    Standing Status: Standing     Number of Occurrences: 1     Standing Expiration Date:   . Ferritin    Standing Status: Standing     Number of Occurrences: 1     Standing Expiration Date:   .  Reticulocytes    Standing Status: Standing     Number of Occurrences: 1     Standing Expiration Date:   . Save smear    Standing Status: Standing     Number of Occurrences: 1     Standing Expiration Date:   . Pathologist smear review    Standing Status: Standing     Number of Occurrences: 1     Standing Expiration Date:   . Consult to hematology    Standing Status: Standing     Number of Occurrences: 1     Standing Expiration Date:     Order Specific Question:  Place call to:    Answer:  on call    Order Specific Question:  Reason for Consult    Answer:  Consult  . Consult to hematology    Standing Status: Standing     Number of Occurrences: 1     Standing Expiration Date:     Order Specific Question:  Consult Timeframe    Answer:  STAT - requires a response within one hour    Order Specific Question:  Reason for Consult?    Answer:  thrombocytopenia  . Consult to oncology    Standing Status: Standing     Number of Occurrences: 1     Standing Expiration Date:     Order Specific Question:  Place call to:    Answer:  Dr Burr Medico    Order Specific Question:  Reason for Consult    Answer:  Admit  . Consult to hospitalist    Standing Status: Standing     Number of Occurrences: 1     Standing Expiration Date:     Order Specific  Question:  Place call to:    Answer:  [employer] [facility] Question:  Reason for Consult    Answer:  Admit  . Type and screen    Standing Status: Standing     Number of Occurrences: 1     Standing Expiration Date:   . ABO/Rh    Standing Status: Standing     Number of Occurrences: 1     Standing Expiration Date:   . Place in observation (patient's expected length of stay will be less than 2 midnights)    Standing Status: Standing     Number of Occurrences: 1     Standing Expiration Date:     Order Specific Question:  Hospital Area    Answer:  [facility] [[id number]]    Order Specific Question:  Diagnosis    Answer:   Thrombocytopenia [[id number]]    Order Specific Question:  Level of Care    Answer:  Telemetry [5]    Order Specific Question:  Admitting Physician    Answer:  Velvet Bathe [[id number]]    Order Specific Question:  Attending Physician    Answer:  Jackelyn Knife [[id number]]    Order Specific Question:  PT Class (Do Not Modify)    Answer:  Observation [104]    Order Specific Question:  PT Acc Code (Do Not Modify)    Answer:  Observation [10022]  . Place in observation (patient's expected length of stay will be less than 2 midnights)    Standing Status: Standing     Number of Occurrences: 1     Standing Expiration Date:     Order Specific Question:  Hospital Area    Answer:  [facility] [[id number]]    Order Specific Question:  Diagnosis    Answer:  Thrombocytopenia [[id number]]    Order Specific Question:  Level of Care    Answer:  Telemetry [5]    Order Specific Question:  Admitting Physician    Answer:  Velvet Bathe [[id number]]    Order Specific Question:  Attending Physician    Answer:  Velvet Bathe [[id number]]    Order Specific Question:  PT Class (Do Not Modify)    Answer:  Observation [104]    Order Specific Question:  PT Acc Code (Do Not Modify)    Answer:  Observation [10022]       Truitt Merle, MD 11/13/2014 7:32 PM   \

## 2014-11-13 NOTE — ED Notes (Signed)
Dr. Annamaria Boots at bedside and requested that patient not be transported at this time. [name], RN notified.

## 2014-11-13 NOTE — ED Notes (Signed)
[name] said ok to bring pt at 19:25.Miranda Mcguire

## 2014-11-13 NOTE — ED Notes (Signed)
Bed: WA06 Expected date:  Expected time:  Means of arrival:  Comments: Triage 2 

## 2014-11-13 NOTE — Progress Notes (Signed)
Pre visit review using our clinic review tool, if applicable. No additional management support is needed unless otherwise documented below in the visit note. 

## 2014-11-13 NOTE — Telephone Encounter (Signed)
Received a call from [place] at [facility] lab that pt's Platelet count is 13 and showed immature cells.  The lab will be accepted and sent for path review for abnormal cells.

## 2014-11-13 NOTE — Progress Notes (Signed)
   Subjective:    Patient ID: Miranda Mcguire, female    DOB: 08-05-1945, 69 y.o.   MRN: [id number]  HPI Here for a month of easy bruising on her arms, legs, and trunk. Then one week ago she developed soreness around he gums and the gums started to bleed easily when she flosses. She saw here dentist, Dr. Raelyn Ensign, last week and he gave her Magic Mouthwash. This has not helped. There have been no recent changes in her medications. She feels well in general. She and her husband just returned from a vacation trip to [facility].    Review of Systems  Constitutional: Negative.   HENT: Positive for dental problem. Negative for congestion, facial swelling, mouth sores, nosebleeds, postnasal drip, rhinorrhea, sinus pressure and sore throat.   Eyes: Negative.   Respiratory: Negative.   Cardiovascular: Negative.   Endocrine: Negative.   Hematological: Negative for adenopathy. Bruises/bleeds easily.       Objective:   Physical Exam  Constitutional: She appears well-developed and well-nourished.  HENT:  Right Ear: External ear normal.  Left Ear: External ear normal.  Nose: Nose normal.  Her gums are inflamed and friable   Eyes: Conjunctivae and EOM are normal. Pupils are equal, round, and reactive to light.  Neck: No thyromegaly present.  Cardiovascular: Normal rate, regular rhythm, normal heart sounds and intact distal pulses.   Pulmonary/Chest: Effort normal and breath sounds normal.  Abdominal: Soft. Bowel sounds are normal. She exhibits no distension and no mass. There is no tenderness. There is no rebound and no guarding.  No HSM  Musculoskeletal: She exhibits no edema.  Lymphadenopathy:    She has no cervical adenopathy.  Skin:  Scattered small ecchymoses on arms, legs, and trunk          Assessment & Plan:  It is not clear what the etiology of this could be. It does not seem to be related to medications. We will get labs today including a CBC, hepatic panel. PT, and PTT.

## 2014-11-13 NOTE — Telephone Encounter (Signed)
The patient is aware. She was told to go to the ER

## 2014-11-13 NOTE — ED Notes (Signed)
Pt, being sent by Sharlene Motts MD, c/o bruising and platelet count 13.

## 2014-11-13 NOTE — ED Notes (Signed)
Called [facility] about patient placement. Informed they will call back with bed assignment.

## 2014-11-13 NOTE — ED Provider Notes (Signed)
CSN: [id number]     Arrival date & time 11/13/14  1530 History   First MD Initiated Contact with Patient 11/13/14 1629     Chief Complaint  Patient presents with  . Abnormal Lab  . Bleeding/Bruising     (Consider location/radiation/quality/duration/timing/severity/associated sxs/prior Treatment) The history is provided by the patient. No language interpreter was used.  Miranda Mcguire is a 69 y.o female with a history of anemia, hyperlipidemia, HTN, OA, OP, and an endometrial polyp who present for bleeding from the gums and easy bruising for the past week.  She states she went to the dentist last week due to the bleeding and he prescribed her magic mouth wash which did not help so she went to her pcp Dr. Sarajane Jews today.  He ran labs and called her at home and told her to come to the ED because her platelets were low.  She denies any headaches, loss of consciousness, dizziness, chest pain, shortness of breath, abdominal pain, nausea, vomiting, or bruising of the legs.    Past Medical History  Diagnosis Date  . Allergy   . Anemia     nos  . Hyperlipidemia   . Hypertension   . OA (osteoarthritis)   . OP (osteoporosis)     last dexa 08-15-08  . Endometrial polyp     post-menopausal bleeding   Past Surgical History  Procedure Laterality Date  . Tonsilectomy, adenoidectomy, bilateral myringotomy and tubes    . Herniated disc      L4-5 per Dr Saintclair Halsted  . Endometrial polypectomy      with D and C 03-08-09 pr dr Gardenia Phlegm  . Colonoscopy  02-14-08    per Dr. Ardis Hughs, repeat in 5 yrs  . Dilation and curettage of uterus     Family History  Problem Relation Age of Onset  . Stroke Other    History  Substance Use Topics  . Smoking status: Former Research scientist (life sciences)  . Smokeless tobacco: Never Used  . Alcohol Use: No   OB History    No data available     Review of Systems  Constitutional: Negative for fever.  HENT: Negative for nosebleeds.   Hematological: Bruises/bleeds easily.  All other systems reviewed  and are negative.     Allergies  Aspirin and Penicillins  Home Medications   Prior to Admission medications   Medication Sig Start Date End Date Taking? Authorizing Provider  atorvastatin (LIPITOR) 20 MG tablet TAKE 1 TABLET (20 MG TOTAL) BY MOUTH DAILY. 09/04/14  Yes Laurey Morale, MD  B Complex Vitamins (VITAMIN B COMPLEX PO) Take 1 tablet by mouth daily.    Yes Historical Provider, MD  CALCIUM-VITAMIN D PO Take 1 tablet by mouth daily.    Yes Historical Provider, MD  fish oil-omega-3 fatty acids 1000 MG capsule Take 1 g by mouth daily.     Yes Historical Provider, MD  Garlic TABS Take 1 tablet by mouth daily.    Yes Historical Provider, MD  levothyroxine (SYNTHROID, LEVOTHROID) 50 MCG tablet TAKE 1 TABLET (50 MCG TOTAL) BY MOUTH DAILY. 09/04/14  Yes Laurey Morale, MD  MAGNESIUM PO Take 1 tablet by mouth daily.   Yes Historical Provider, MD  Multiple Vitamins-Minerals (ICAPS PO) Take 1 tablet by mouth daily. macavue   Yes Historical Provider, MD  multivitamin [facility]) per tablet Take 1 tablet by mouth daily.     Yes Historical Provider, MD  nabumetone (RELAFEN) 750 MG tablet TAKE 1 TABLET (750 MG TOTAL) BY MOUTH 2 (  TWO) TIMES DAILY. 09/04/14  Yes Laurey Morale, MD   BP 151/98 mmHg  Pulse 82  Temp(Src) 98.3 F (36.8 C) (Oral)  Resp 18  SpO2 98% Physical Exam  Constitutional: She is oriented to person, place, and time. She appears well-developed and well-nourished.  HENT:  Head: Normocephalic and atraumatic.  Eyes: Conjunctivae are normal.  Neck: Normal range of motion. Neck supple.  Cardiovascular: Normal rate and regular rhythm.   Pulmonary/Chest: Effort normal. No respiratory distress. She has no wheezes. She has no rales.  Abdominal: Soft. There is no hepatosplenomegaly. There is no tenderness.  Musculoskeletal: Normal range of motion.  Neurological: She is alert and oriented to person, place, and time.  Skin: Skin is warm and dry. No pallor.  2cm area of ecchymosis over  the left chest.     ED Course  Procedures (including critical care time) Labs Review Labs Reviewed  LACTATE DEHYDROGENASE - Abnormal; Notable for the following:    LDH 357 (*)    All other components within normal limits  CBC WITH DIFFERENTIAL/PLATELET - Abnormal; Notable for the following:    RBC 3.10 (*)    Hemoglobin 9.1 (*)    HCT 27.7 (*)    Platelets 13 (*)    All other components within normal limits  DIC (DISSEMINATED INTRAVASCULAR COAGULATION) PANEL - Abnormal; Notable for the following:    D-Dimer, Quant 3.53 (*)    Platelets 13 (*)    All other components within normal limits  RETICULOCYTES - Abnormal; Notable for the following:    RBC. 3.08 (*)    All other components within normal limits  RAPID HIV SCREEN (HIV 1/2 AB+AG)  VITAMIN B12  FOLATE  IRON AND TIBC  FERRITIN  SAVE SMEAR  PATHOLOGIST SMEAR REVIEW  TYPE AND SCREEN  ABO/RH    Imaging Review No results found.   EKG Interpretation None      MDM   Final diagnoses:  Thrombocytopenia  Anemia, unspecified anemia type  Patient presents for bleeding gums and easy bruising for the past week.  She was seen by Dr. Sarajane Jews today who ran labs and she is had a platelet count of 13. She is not complaining of any pain now.  Her exam is normal and she has no hepatosplenomegaly.  Her vitals are stable and she is in no distress.   We will not transfuse her now since she is stable with no acute bleed now. Some labs were not repeated since they were in the system from earlier today. She is anemic which is also new. I have discussed this patient with Dr. Wilson Singer who has seen the patient.  18:30 He called Dr. Burr Medico, oncology/hematology who came to the ED to see the patient and is transferring her to [facility] for acute leukemia.     Miranda Glazier, PA-C 11/14/14 0126  Miranda Manifold, MD 11/15/14 1407

## 2014-11-13 NOTE — ED Provider Notes (Signed)
Medical screening examination/treatment/procedure(s) were conducted as a shared visit with non-physician practitioner(s) and myself.  I personally evaluated the patient during the encounter.   EKG Interpretation None     68yF sent by PCP for evaluation of severe thrombocytopena. Platelets 13,000. Will repeat but likely real with recent symptoms of easy bruising and bleeding gums when brushing teeth. Also complaining of fatigue and occasional rapid heart beat. Additional labs by PCP with normal PT, INR, PTT. Normal TSH. Normal LFTs. It appears peripheral smear was sent. Some reduction in GFR from a few months ago, but remains with normal Cr. New anemia of 9.0. Neutropenia. She is afebrile. No mental status changes. No known hx of malignancy. No recent med changes. No obvious possible offenders in reviewing med list.   She is profoundly thrombocytopenic and has new anemia but no clinical evidence of life threatening bleed at this time. Type and screen sent. Transfusion deferred at this time though. Mild tachycardia, but is actually hypertensive. No HA, confusion or neurological findings. Will discuss with hematology. Medicine admission.   Discussed with Dr Burr Medico, heme/onc. Requesting DIC and anemia panel. Lab called to have smear prepared for her to review. Will discuss with medicine for admission.   Virgel Manifold, MD 11/15/14 [phone]

## 2014-11-13 NOTE — ED Notes (Signed)
Pt c/o increased bruising and swollen/bleeding gums x 1 week.  Denies pain. Pt was seen at PCP and lab work resulted a platelet count of 13.

## 2014-11-14 LAB — CBC WITH DIFFERENTIAL/PLATELET
BAND NEUTROPHILS: 0 % (ref 0–10)
BLASTS: 0 %
Basophils Absolute: 0 10*3/uL (ref 0.0–0.1)
Basophils Relative: 0 % (ref 0–1)
Eosinophils Absolute: 0 10*3/uL (ref 0.0–0.7)
Eosinophils Relative: 0 % (ref 0–5)
HEMATOCRIT: 27.7 % — AB (ref 36.0–46.0)
HEMOGLOBIN: 9.1 g/dL — AB (ref 12.0–15.0)
LYMPHS ABS: 2.1 10*3/uL (ref 0.7–4.0)
Lymphocytes Relative: 34 % (ref 12–46)
MCH: 29.4 pg (ref 26.0–34.0)
MCHC: 32.9 g/dL (ref 30.0–36.0)
MCV: 89.4 fL (ref 78.0–100.0)
Metamyelocytes Relative: 0 %
Monocytes Absolute: 0.1 10*3/uL (ref 0.1–1.0)
Monocytes Relative: 1 % — ABNORMAL LOW (ref 3–12)
Myelocytes: 0 %
NRBC: 0 /100{WBCs}
Neutro Abs: 0.3 10*3/uL — ABNORMAL LOW (ref 1.7–7.7)
Neutrophils Relative %: 5 % — ABNORMAL LOW (ref 43–77)
Other: 60 %
PROMYELOCYTES ABS: 0 %
Platelets: 13 10*3/uL — CL (ref 150–400)
RBC: 3.1 MIL/uL — ABNORMAL LOW (ref 3.87–5.11)
RDW: 15.2 % (ref 11.5–15.5)
WBC: 6.2 10*3/uL (ref 4.0–10.5)

## 2014-11-14 LAB — PATHOLOGIST SMEAR REVIEW

## 2014-11-15 LAB — PATHOLOGIST SMEAR REVIEW

## 2014-12-28 DIAGNOSIS — C9201 Acute myeloblastic leukemia, in remission: Secondary | ICD-10-CM | POA: Diagnosis not present

## 2015-01-02 ENCOUNTER — Ambulatory Visit: Payer: PRIVATE HEALTH INSURANCE | Admitting: Family Medicine

## 2015-01-03 DIAGNOSIS — Z79899 Other long term (current) drug therapy: Secondary | ICD-10-CM | POA: Diagnosis not present

## 2015-01-03 DIAGNOSIS — E039 Hypothyroidism, unspecified: Secondary | ICD-10-CM | POA: Diagnosis not present

## 2015-01-03 DIAGNOSIS — E785 Hyperlipidemia, unspecified: Secondary | ICD-10-CM | POA: Diagnosis not present

## 2015-01-03 DIAGNOSIS — I10 Essential (primary) hypertension: Secondary | ICD-10-CM | POA: Diagnosis not present

## 2015-01-03 DIAGNOSIS — J90 Pleural effusion, not elsewhere classified: Secondary | ICD-10-CM | POA: Diagnosis not present

## 2015-01-03 DIAGNOSIS — C9200 Acute myeloblastic leukemia, not having achieved remission: Secondary | ICD-10-CM | POA: Diagnosis not present

## 2015-01-03 DIAGNOSIS — Z886 Allergy status to analgesic agent status: Secondary | ICD-10-CM | POA: Diagnosis not present

## 2015-01-03 DIAGNOSIS — R791 Abnormal coagulation profile: Secondary | ICD-10-CM | POA: Diagnosis not present

## 2015-01-03 DIAGNOSIS — Z780 Asymptomatic menopausal state: Secondary | ICD-10-CM | POA: Diagnosis not present

## 2015-01-17 ENCOUNTER — Other Ambulatory Visit: Payer: Self-pay | Admitting: *Deleted

## 2015-01-17 ENCOUNTER — Other Ambulatory Visit: Payer: Self-pay | Admitting: Hematology

## 2015-01-17 DIAGNOSIS — K5900 Constipation, unspecified: Secondary | ICD-10-CM | POA: Diagnosis present

## 2015-01-17 DIAGNOSIS — C9200 Acute myeloblastic leukemia, not having achieved remission: Secondary | ICD-10-CM | POA: Diagnosis not present

## 2015-01-17 DIAGNOSIS — D630 Anemia in neoplastic disease: Secondary | ICD-10-CM | POA: Diagnosis present

## 2015-01-17 DIAGNOSIS — Z87891 Personal history of nicotine dependence: Secondary | ICD-10-CM | POA: Diagnosis not present

## 2015-01-17 DIAGNOSIS — C9201 Acute myeloblastic leukemia, in remission: Secondary | ICD-10-CM | POA: Diagnosis present

## 2015-01-17 DIAGNOSIS — M793 Panniculitis, unspecified: Secondary | ICD-10-CM | POA: Diagnosis present

## 2015-01-17 DIAGNOSIS — Z5111 Encounter for antineoplastic chemotherapy: Secondary | ICD-10-CM | POA: Diagnosis not present

## 2015-01-17 DIAGNOSIS — D6959 Other secondary thrombocytopenia: Secondary | ICD-10-CM | POA: Diagnosis not present

## 2015-01-17 DIAGNOSIS — Z823 Family history of stroke: Secondary | ICD-10-CM | POA: Diagnosis not present

## 2015-01-17 DIAGNOSIS — M1990 Unspecified osteoarthritis, unspecified site: Secondary | ICD-10-CM | POA: Diagnosis present

## 2015-01-17 DIAGNOSIS — E039 Hypothyroidism, unspecified: Secondary | ICD-10-CM | POA: Diagnosis present

## 2015-01-17 DIAGNOSIS — E785 Hyperlipidemia, unspecified: Secondary | ICD-10-CM | POA: Diagnosis present

## 2015-01-17 DIAGNOSIS — T451X5A Adverse effect of antineoplastic and immunosuppressive drugs, initial encounter: Secondary | ICD-10-CM | POA: Diagnosis not present

## 2015-01-17 DIAGNOSIS — I10 Essential (primary) hypertension: Secondary | ICD-10-CM | POA: Diagnosis present

## 2015-01-18 ENCOUNTER — Telehealth: Payer: Self-pay | Admitting: Hematology

## 2015-01-18 ENCOUNTER — Telehealth: Payer: Self-pay | Admitting: *Deleted

## 2015-01-18 NOTE — Telephone Encounter (Signed)
Spoke with Dorise Hiss, RN @ [facility], and was informed that pt will be discharged home on Mon 01/22/15.  Faxed pt's appt calendar to [facility] per her request.  Adonis Huguenin stated she would relay appt message to pt. Vivian's  Phone    [phone]   ;     Fax     [phone]     Or      [phone].

## 2015-01-18 NOTE — Telephone Encounter (Signed)
s.w. pt husband and advised on JULY appt.he will have her get sched at visit...faxed pt sched to Dorise Hiss [phone]

## 2015-01-24 ENCOUNTER — Encounter: Payer: Self-pay | Admitting: Hematology

## 2015-01-24 ENCOUNTER — Ambulatory Visit: Payer: Medicare Other

## 2015-01-24 ENCOUNTER — Other Ambulatory Visit (HOSPITAL_BASED_OUTPATIENT_CLINIC_OR_DEPARTMENT_OTHER): Payer: Medicare Other

## 2015-01-24 ENCOUNTER — Ambulatory Visit (HOSPITAL_BASED_OUTPATIENT_CLINIC_OR_DEPARTMENT_OTHER): Payer: Medicare Other | Admitting: Hematology

## 2015-01-24 VITALS — BP 153/76 | HR 97 | Temp 98.4°F | Resp 18 | Ht 60.0 in | Wt 157.3 lb

## 2015-01-24 DIAGNOSIS — Z452 Encounter for adjustment and management of vascular access device: Secondary | ICD-10-CM | POA: Diagnosis not present

## 2015-01-24 DIAGNOSIS — C9201 Acute myeloblastic leukemia, in remission: Secondary | ICD-10-CM | POA: Diagnosis not present

## 2015-01-24 DIAGNOSIS — E039 Hypothyroidism, unspecified: Secondary | ICD-10-CM

## 2015-01-24 DIAGNOSIS — Z95828 Presence of other vascular implants and grafts: Secondary | ICD-10-CM

## 2015-01-24 DIAGNOSIS — C9200 Acute myeloblastic leukemia, not having achieved remission: Secondary | ICD-10-CM

## 2015-01-24 DIAGNOSIS — Z5189 Encounter for other specified aftercare: Secondary | ICD-10-CM | POA: Diagnosis not present

## 2015-01-24 DIAGNOSIS — R197 Diarrhea, unspecified: Secondary | ICD-10-CM

## 2015-01-24 DIAGNOSIS — D696 Thrombocytopenia, unspecified: Secondary | ICD-10-CM | POA: Diagnosis not present

## 2015-01-24 DIAGNOSIS — D649 Anemia, unspecified: Secondary | ICD-10-CM | POA: Diagnosis not present

## 2015-01-24 LAB — CBC WITH DIFFERENTIAL/PLATELET
BASO%: 0.8 % (ref 0.0–2.0)
Basophils Absolute: 0 10*3/uL (ref 0.0–0.1)
EOS%: 4.6 % (ref 0.0–7.0)
Eosinophils Absolute: 0.2 10*3/uL (ref 0.0–0.5)
HCT: 26.5 % — ABNORMAL LOW (ref 34.8–46.6)
HEMOGLOBIN: 9 g/dL — AB (ref 11.6–15.9)
LYMPH#: 0.3 10*3/uL — AB (ref 0.9–3.3)
LYMPH%: 8.1 % — ABNORMAL LOW (ref 14.0–49.7)
MCH: 28.8 pg (ref 25.1–34.0)
MCHC: 34 g/dL (ref 31.5–36.0)
MCV: 84.9 fL (ref 79.5–101.0)
MONO#: 0.1 10*3/uL (ref 0.1–0.9)
MONO%: 3.2 % (ref 0.0–14.0)
NEUT#: 3.1 10*3/uL (ref 1.5–6.5)
NEUT%: 83.3 % — ABNORMAL HIGH (ref 38.4–76.8)
Platelets: 32 10*3/uL — ABNORMAL LOW (ref 145–400)
RBC: 3.12 10*6/uL — ABNORMAL LOW (ref 3.70–5.45)
RDW: 15.3 % — AB (ref 11.2–14.5)
WBC: 3.7 10*3/uL — ABNORMAL LOW (ref 3.9–10.3)
nRBC: 0 % (ref 0–0)

## 2015-01-24 LAB — MAGNESIUM (CC13): MAGNESIUM: 2 mg/dL (ref 1.5–2.5)

## 2015-01-24 LAB — COMPREHENSIVE METABOLIC PANEL (CC13)
ALT: 19 U/L (ref 0–55)
ANION GAP: 7 meq/L (ref 3–11)
AST: 15 U/L (ref 5–34)
Albumin: 3.3 g/dL — ABNORMAL LOW (ref 3.5–5.0)
Alkaline Phosphatase: 105 U/L (ref 40–150)
BILIRUBIN TOTAL: 0.82 mg/dL (ref 0.20–1.20)
BUN: 23.1 mg/dL (ref 7.0–26.0)
CALCIUM: 9.5 mg/dL (ref 8.4–10.4)
CO2: 27 meq/L (ref 22–29)
Chloride: 108 mEq/L (ref 98–109)
Creatinine: 1.2 mg/dL — ABNORMAL HIGH (ref 0.6–1.1)
EGFR: 46 mL/min/{1.73_m2} — AB (ref >90)
Glucose: 110 mg/dl (ref 70–140)
Potassium: 4 mEq/L (ref 3.5–5.1)
SODIUM: 143 meq/L (ref 136–145)
Total Protein: 6.6 g/dL (ref 6.4–8.3)

## 2015-01-24 MED ORDER — SODIUM CHLORIDE 0.9 % IJ SOLN
10.0000 mL | INTRAMUSCULAR | Status: DC | PRN
  Filled 2015-01-24: qty 10

## 2015-01-24 MED ORDER — SODIUM CHLORIDE 0.9 % IJ SOLN
10.0000 mL | INTRAMUSCULAR | Status: DC | PRN
  Administered 2015-01-24 (×2): 10 mL via INTRAVENOUS
  Filled 2015-01-24: qty 10

## 2015-01-24 MED ORDER — HEPARIN SOD (PORK) LOCK FLUSH 100 UNIT/ML IV SOLN
500.0000 [IU] | Freq: Once | INTRAVENOUS | Status: AC
  Administered 2015-01-24: 500 [IU] via INTRAVENOUS
  Filled 2015-01-24: qty 5

## 2015-01-24 MED ORDER — PEGFILGRASTIM INJECTION 6 MG/0.6ML
6.0000 mg | Freq: Once | SUBCUTANEOUS | Status: AC
  Administered 2015-01-24: 6 mg via SUBCUTANEOUS
  Filled 2015-01-24: qty 0.6

## 2015-01-24 MED ORDER — HEPARIN SOD (PORK) LOCK FLUSH 100 UNIT/ML IV SOLN
500.0000 [IU] | Freq: Once | INTRAVENOUS | Status: DC
  Filled 2015-01-24: qty 5

## 2015-01-24 NOTE — Progress Notes (Signed)
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 Myeloid panel PENDING.  11/15/2014 - Chemotherapy Induction with cytarabine and daunorubicin (7+3), nadir bmbx 11/28/14 showing residual disease  11/29/2014 - Chemotherapy Re-induction with cytarabine and daunorubicin (5+2); nadir bmbx 12/11/14 negative  12/28/2014 Remission Recovery bmbx c/w remission with incomplete platelet recovery (CRi)  01/17/2015 - Chemotherapy C1 Hidac 1gm/m2 + Neulasta   INTERVAL HISTORY: Mrs. Holsonback returns for follow-up. I first met her in [facility] emergency room on 11/14/2014 and transfer her to [facility] for newly diagnosed acute leukemia. She received induction chemotherapy, and achieved complete remission. Her hospital course was quite complicated with neutropenic fever, diarrhea, and pulmonary edema, AKI from Lasix etc. She is status post first cycle hidac on 01/17/2015.  She returns today for lab and the Neulasta injection. She feels fairly well. She still has diarrhea, a few times a day. No abdominal pain or nausea. She has moderate fatigue, able to do all self cares. She denies any fever, bleeding or other new symptoms.   REVIEW OF SYSTEMS:   Constitutional: Denies fevers, chills or abnormal weight loss, (+) fatigue  Eyes: Denies blurriness of vision Ears, nose, mouth, throat, and face: Denies mucositis or sore throat Respiratory: Denies cough, dyspnea or wheezes Cardiovascular: Denies palpitation, chest discomfort or lower extremity swelling Gastrointestinal:  Denies nausea, heartburn or change in bowel habits Skin: Denies abnormal skin  rashes Lymphatics: Denies new lymphadenopathy or easy bruising Neurological:Denies numbness, tingling or new weaknesses Behavioral/Psych: Mood is stable, no new changes  All other systems were reviewed with the patient and are negative.  MEDICAL HISTORY:  Past Medical History  Diagnosis Date  . Allergy   . Anemia     nos  . Hyperlipidemia   . Hypertension   . OA (osteoarthritis)   . OP (osteoporosis)     last dexa 08-15-08  . Endometrial polyp     post-menopausal bleeding    SURGICAL HISTORY: Past Surgical History  Procedure Laterality Date  . Tonsilectomy, adenoidectomy, bilateral myringotomy and tubes    . Herniated disc      L4-5 per Dr Saintclair Halsted  . Endometrial polypectomy      with D and C 03-08-09 pr dr Gardenia Phlegm  . Colonoscopy  02-14-08    per Dr. Ardis Hughs, repeat in 5 yrs  . Dilation and curettage of uterus      I have reviewed the social history and family history with the patient and they are unchanged from previous note.  ALLERGIES:  is allergic to penicillins and aspirin.  MEDICATIONS:  Current Outpatient Prescriptions  Medication Sig Dispense Refill  . amLODipine (NORVASC) 10 MG tablet Take 10 mg by mouth daily.    Marland Kitchen atorvastatin (LIPITOR) 20 MG tablet TAKE 1 TABLET (20 MG TOTAL) BY MOUTH DAILY. 90 tablet 2  . fluconazole (DIFLUCAN) 200 MG tablet Take 200 mg by mouth daily.    . furosemide (LASIX) 40 MG tablet Take 40 mg by mouth as needed.    Marland Kitchen levofloxacin (LEVAQUIN) 500 MG tablet Take 500 mg by mouth daily.    Marland Kitchen levothyroxine (SYNTHROID, LEVOTHROID) 50 MCG tablet TAKE 1 TABLET (50 MCG TOTAL) BY MOUTH DAILY. 90 tablet 2  .  lidocaine-prilocaine (EMLA) cream Apply a small amount to skin over PAC 30-45 minutes prior to access.    . Metoprolol Tartrate 37.5 MG TABS Take 37.5 mg by mouth 2 (two) times daily.    . ondansetron (ZOFRAN) 8 MG tablet Take 8 mg by mouth every 8 (eight) hours.    . B Complex Vitamins (VITAMIN B COMPLEX PO) Take 1 tablet by mouth daily.     Marland Kitchen  CALCIUM-VITAMIN D PO Take 1 tablet by mouth daily.     . fish oil-omega-3 fatty acids 1000 MG capsule Take 1 g by mouth daily.      . Garlic TABS Take 1 tablet by mouth daily.     Marland Kitchen loratadine (CLARITIN) 10 MG tablet Take 10 mg by mouth daily.    Marland Kitchen MAGNESIUM PO Take 1 tablet by mouth daily.    . Multiple Vitamins-Minerals (ICAPS PO) Take 1 tablet by mouth daily. macavue    . nabumetone (RELAFEN) 750 MG tablet TAKE 1 TABLET (750 MG TOTAL) BY MOUTH 2 (TWO) TIMES DAILY. (Patient not taking: Reported on 01/24/2015) 180 tablet 2   No current facility-administered medications for this visit.   Facility-Administered Medications Ordered in Other Visits  Medication Dose Route Frequency Provider Last Rate Last Dose  . heparin lock flush 100 unit/mL  500 Units Intravenous Once Truitt Merle, MD      . sodium chloride 0.9 % injection 10 mL  10 mL Intravenous PRN Truitt Merle, MD   10 mL at 01/24/15 0950    PHYSICAL EXAMINATION: ECOG PERFORMANCE STATUS: 2 - Symptomatic, <50% confined to bed  Filed Vitals:   01/24/15 1010  BP: 153/76  Pulse: 97  Temp: 98.4 F (36.9 C)  Resp: 18   Filed Weights   01/24/15 1010  Weight: 157 lb 4.8 oz (71.351 kg)    GENERAL:alert, no distress and comfortable SKIN: skin color, texture, turgor are normal, no rashes or significant lesions EYES: normal, Conjunctiva are pink and non-injected, sclera clear OROPHARYNX:no exudate, no erythema and lips, buccal mucosa, and tongue normal  NECK: supple, thyroid normal size, non-tender, without nodularity LYMPH:  no palpable lymphadenopathy in the cervical, axillary or inguinal LUNGS: clear to auscultation and percussion with normal breathing effort HEART: regular rate & rhythm and no murmurs and no lower extremity edema ABDOMEN:abdomen soft, non-tender and normal bowel sounds Musculoskeletal:no cyanosis of digits and no clubbing  NEURO: alert & oriented x 3 with fluent speech, no focal motor/sensory deficits  LABORATORY DATA:    I have reviewed the data as listed CBC Latest Ref Rng 01/24/2015 11/13/2014 11/13/2014  WBC 3.9 - 10.3 10e3/uL 3.7(L) - 6.2  Hemoglobin 11.6 - 15.9 g/dL 9.0(L) - 9.1(L)  Hematocrit 34.8 - 46.6 % 26.5(L) - 27.7(L)  Platelets 145 - 400 10e3/uL 32(L) 13(LL) 13(LL)     CMP Latest Ref Rng 01/24/2015 11/13/2014 06/26/2014  Glucose 70 - 140 mg/dl 110 109(H) 100(H)  BUN 7.0 - 26.0 mg/dL 23.'1 21 14  ' Creatinine 0.6 - 1.1 mg/dL 1.2(H) 1.04 0.7  Sodium 136 - 145 mEq/L 143 140 139  Potassium 3.5 - 5.1 mEq/L 4.0 4.2 4.3  Chloride 96 - 112 mEq/L - 106 105  CO2 22 - 29 mEq/L '27 28 28  ' Calcium 8.4 - 10.4 mg/dL 9.5 9.2 8.6  Total Protein 6.4 - 8.3 g/dL 6.6 6.9 6.4  Total Bilirubin 0.20 - 1.20 mg/dL 0.82 0.8 1.0  Alkaline Phos 40 - 150 U/L 105 51 52  AST 5 - 34 U/L 15 20  18  ALT 0 - 55 U/L '19 26 23      ' RADIOGRAPHIC STUDIES: I have personally reviewed the radiological images as listed and agreed with the findings in the report. No results found.   ASSESSMENT & PLAN:  69 year old Caucasian female  1. AML, in remission, normal cytogenetics -She is currently status post her first cycle of hidac Lab reviewed with her. She is not neutropenic, her platelet count is 32K, no clinical bleeding -She will return in 2 days and next week to repeat lab.  -Neulasta today  -I'll schedule her platelet transfusion if her platelet count less than 20K  -She will return to [facility] in 3 weeks for second cycle chemo.  -She will call me if she needs to return for lab or other supportive care.  I'll see her as needed.   ll questions were answered. The patient knows to call the clinic with any problems, questions or concerns. No barriers to learning was detected.  I spent 20 minutes counseling the patient face to face. The total time spent in the appointment was 25 minutes and more than 50% was on counseling and review of test results     Truitt Merle, MD 01/24/2015 10:45 AM

## 2015-01-24 NOTE — Progress Notes (Signed)
Neulasta injection given by desk nurse.

## 2015-01-24 NOTE — Patient Instructions (Signed)

## 2015-01-25 ENCOUNTER — Telehealth: Payer: Self-pay | Admitting: Hematology

## 2015-01-25 ENCOUNTER — Telehealth: Payer: Self-pay | Admitting: *Deleted

## 2015-01-25 NOTE — Addendum Note (Signed)
Addended by: Truitt Merle on: 01/25/2015 07:53 AM   Modules accepted: Orders, SmartSet

## 2015-01-25 NOTE — Telephone Encounter (Signed)
Called [facility] & 11 am appt made for tentative blood transfusion for tomorrow depending on lab results.

## 2015-01-25 NOTE — Addendum Note (Signed)
Addended by: Truitt Merle on: 01/25/2015 05:38 PM   Modules accepted: Orders

## 2015-01-25 NOTE — Telephone Encounter (Signed)
oper pof to sch pt blood trans-cld MW she stated no room in infusion for 7/22-stated to call Myrtle to call [facility] to sch-went to talk to [facility] and she was not there talk to Dr Burr Medico to [place] a staff message to adv to call [facility]-to book appt and call pt

## 2015-01-26 ENCOUNTER — Telehealth: Payer: Self-pay | Admitting: *Deleted

## 2015-01-26 ENCOUNTER — Ambulatory Visit (HOSPITAL_BASED_OUTPATIENT_CLINIC_OR_DEPARTMENT_OTHER): Payer: Medicare Other

## 2015-01-26 ENCOUNTER — Ambulatory Visit (HOSPITAL_COMMUNITY)
Admission: RE | Admit: 2015-01-26 | Discharge: 2015-01-26 | Disposition: A | Discharge status: Home / Self Care | Payer: Medicare Other | Source: Ambulatory Visit | Attending: Hematology | Admitting: Hematology

## 2015-01-26 ENCOUNTER — Other Ambulatory Visit (HOSPITAL_BASED_OUTPATIENT_CLINIC_OR_DEPARTMENT_OTHER): Payer: Medicare Other

## 2015-01-26 VITALS — BP 123/59 | HR 99 | Temp 98.5°F | Resp 18

## 2015-01-26 DIAGNOSIS — E039 Hypothyroidism, unspecified: Secondary | ICD-10-CM | POA: Diagnosis not present

## 2015-01-26 DIAGNOSIS — C9201 Acute myeloblastic leukemia, in remission: Secondary | ICD-10-CM | POA: Diagnosis not present

## 2015-01-26 DIAGNOSIS — Z95828 Presence of other vascular implants and grafts: Secondary | ICD-10-CM

## 2015-01-26 DIAGNOSIS — C9200 Acute myeloblastic leukemia, not having achieved remission: Secondary | ICD-10-CM

## 2015-01-26 DIAGNOSIS — Z452 Encounter for adjustment and management of vascular access device: Secondary | ICD-10-CM | POA: Diagnosis not present

## 2015-01-26 DIAGNOSIS — D696 Thrombocytopenia, unspecified: Secondary | ICD-10-CM

## 2015-01-26 DIAGNOSIS — D649 Anemia, unspecified: Secondary | ICD-10-CM

## 2015-01-26 LAB — CBC WITH DIFFERENTIAL/PLATELET
BASO%: 0.6 % (ref 0.0–2.0)
Basophils Absolute: 0 10*3/uL (ref 0.0–0.1)
EOS ABS: 0 10*3/uL (ref 0.0–0.5)
EOS%: 0.5 % (ref 0.0–7.0)
HCT: 25.3 % — ABNORMAL LOW (ref 34.8–46.6)
HGB: 8.6 g/dL — ABNORMAL LOW (ref 11.6–15.9)
LYMPH%: 3.3 % — ABNORMAL LOW (ref 14.0–49.7)
MCH: 28.9 pg (ref 25.1–34.0)
MCHC: 33.8 g/dL (ref 31.5–36.0)
MCV: 85.6 fL (ref 79.5–101.0)
MONO#: 0.2 10*3/uL (ref 0.1–0.9)
MONO%: 2.9 % (ref 0.0–14.0)
NEUT#: 7.7 10*3/uL — ABNORMAL HIGH (ref 1.5–6.5)
NEUT%: 92.7 % — ABNORMAL HIGH (ref 38.4–76.8)
Platelets: 15 10*3/uL — ABNORMAL LOW (ref 145–400)
RBC: 2.96 10*6/uL — ABNORMAL LOW (ref 3.70–5.45)
RDW: 15.3 % — AB (ref 11.2–14.5)
WBC: 8.3 10*3/uL (ref 3.9–10.3)
lymph#: 0.3 10*3/uL — ABNORMAL LOW (ref 0.9–3.3)

## 2015-01-26 LAB — HOLD TUBE, BLOOD BANK

## 2015-01-26 MED ORDER — FUROSEMIDE 10 MG/ML IJ SOLN
20.0000 mg | Freq: Once | INTRAMUSCULAR | Status: DC
Start: 2015-01-26 — End: 2015-01-27
  Filled 2015-01-26: qty 2

## 2015-01-26 MED ORDER — SODIUM CHLORIDE 0.9 % IV SOLN
Freq: Once | INTRAVENOUS | Status: AC
  Administered 2015-01-26: 12:00:00 via INTRAVENOUS

## 2015-01-26 MED ORDER — HEPARIN SOD (PORK) LOCK FLUSH 100 UNIT/ML IV SOLN
500.0000 [IU] | Freq: Every day | INTRAVENOUS | Status: AC | PRN
  Administered 2015-01-26: 500 [IU]
  Filled 2015-01-26: qty 5

## 2015-01-26 MED ORDER — SODIUM CHLORIDE 0.9 % IJ SOLN
10.0000 mL | INTRAMUSCULAR | Status: AC | PRN
  Administered 2015-01-26: 10 mL

## 2015-01-26 MED ORDER — ACETAMINOPHEN 325 MG PO TABS
650.0000 mg | ORAL_TABLET | Freq: Once | ORAL | Status: DC
  Filled 2015-01-26: qty 2

## 2015-01-26 MED ORDER — DIPHENHYDRAMINE HCL 25 MG PO CAPS
25.0000 mg | ORAL_CAPSULE | Freq: Once | ORAL | Status: DC
  Filled 2015-01-26: qty 1

## 2015-01-26 MED ORDER — SODIUM CHLORIDE 0.9 % IJ SOLN
10.0000 mL | INTRAMUSCULAR | Status: DC | PRN
  Administered 2015-01-26: 10 mL via INTRAVENOUS
  Filled 2015-01-26: qty 10

## 2015-01-26 NOTE — Patient Instructions (Signed)

## 2015-01-26 NOTE — Procedures (Signed)
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

## 2015-01-26 NOTE — Telephone Encounter (Signed)
Faxed lab results to [facility]/DR. Ellis/Marie Grubbs RN & pt to received 2 u PRBC & pheresed Plt at [facility].  HAR requested. Pt informed.

## 2015-01-26 NOTE — Telephone Encounter (Signed)
Received call from Debra/[facility] stating that pt has order for tylenol & benadryl prior to blood transfusion & pt states that she was told by [facility] that she couldn't take tylenol & doesn't want benadry b/c she took allerest this am.  She took allerest @ 5:30 am.  [facility] & spoke to one of the triage RN's & she checked with Dr Lissa Merlin PA-?Bayard Hugger & states that she has not had any problems with reactions & they do not usually order premeds with transfusions. Tylenol is OK but pt probably was told not to take tylenol b/c it could mask a fever.  Informed Hilda Blades that tylenol & benadryl OK to take but pt can refuse if she doesn't want it .

## 2015-01-27 LAB — PREPARE PLATELET PHERESIS: UNIT DIVISION: 0

## 2015-01-27 LAB — TYPE AND SCREEN
ABO/RH(D): A POS
ANTIBODY SCREEN: NEGATIVE
UNIT DIVISION: 0
Unit division: 0

## 2015-01-29 ENCOUNTER — Telehealth: Payer: Self-pay | Admitting: *Deleted

## 2015-01-29 ENCOUNTER — Ambulatory Visit (HOSPITAL_BASED_OUTPATIENT_CLINIC_OR_DEPARTMENT_OTHER): Payer: Medicare Other

## 2015-01-29 ENCOUNTER — Other Ambulatory Visit: Payer: Self-pay | Admitting: *Deleted

## 2015-01-29 ENCOUNTER — Ambulatory Visit: Payer: Medicare Other

## 2015-01-29 ENCOUNTER — Other Ambulatory Visit (HOSPITAL_BASED_OUTPATIENT_CLINIC_OR_DEPARTMENT_OTHER): Payer: Medicare Other

## 2015-01-29 VITALS — BP 138/55 | HR 92 | Temp 98.6°F | Resp 18

## 2015-01-29 DIAGNOSIS — Z95828 Presence of other vascular implants and grafts: Secondary | ICD-10-CM

## 2015-01-29 DIAGNOSIS — C9500 Acute leukemia of unspecified cell type not having achieved remission: Secondary | ICD-10-CM | POA: Diagnosis not present

## 2015-01-29 DIAGNOSIS — C9200 Acute myeloblastic leukemia, not having achieved remission: Secondary | ICD-10-CM

## 2015-01-29 DIAGNOSIS — Z452 Encounter for adjustment and management of vascular access device: Secondary | ICD-10-CM | POA: Diagnosis not present

## 2015-01-29 DIAGNOSIS — D696 Thrombocytopenia, unspecified: Secondary | ICD-10-CM | POA: Diagnosis not present

## 2015-01-29 DIAGNOSIS — D649 Anemia, unspecified: Secondary | ICD-10-CM

## 2015-01-29 LAB — COMPREHENSIVE METABOLIC PANEL (CC13)
ALBUMIN: 3.5 g/dL (ref 3.5–5.0)
ALK PHOS: 118 U/L (ref 40–150)
ALT: 24 U/L (ref 0–55)
AST: 15 U/L (ref 5–34)
Anion Gap: 9 mEq/L (ref 3–11)
BUN: 30.7 mg/dL — ABNORMAL HIGH (ref 7.0–26.0)
CALCIUM: 9.5 mg/dL (ref 8.4–10.4)
CHLORIDE: 109 meq/L (ref 98–109)
CO2: 26 mEq/L (ref 22–29)
Creatinine: 1.2 mg/dL — ABNORMAL HIGH (ref 0.6–1.1)
EGFR: 46 mL/min/{1.73_m2} — ABNORMAL LOW (ref >90)
GLUCOSE: 127 mg/dL (ref 70–140)
POTASSIUM: 4 meq/L (ref 3.5–5.1)
Sodium: 144 mEq/L (ref 136–145)
Total Bilirubin: 1.34 mg/dL — ABNORMAL HIGH (ref 0.20–1.20)
Total Protein: 6.9 g/dL (ref 6.4–8.3)

## 2015-01-29 LAB — CBC WITH DIFFERENTIAL/PLATELET
BASO%: 0 % (ref 0.0–2.0)
BASOS ABS: 0 10*3/uL (ref 0.0–0.1)
EOS%: 2.7 % (ref 0.0–7.0)
Eosinophils Absolute: 0 10*3/uL (ref 0.0–0.5)
HEMATOCRIT: 29.2 % — AB (ref 34.8–46.6)
HEMOGLOBIN: 10.3 g/dL — AB (ref 11.6–15.9)
LYMPH%: 38.7 % (ref 14.0–49.7)
MCH: 29.6 pg (ref 25.1–34.0)
MCHC: 35.3 g/dL (ref 31.5–36.0)
MCV: 83.9 fL (ref 79.5–101.0)
MONO#: 0.1 10*3/uL (ref 0.1–0.9)
MONO%: 9.3 % (ref 0.0–14.0)
NEUT%: 49.3 % (ref 38.4–76.8)
NEUTROS ABS: 0.4 10*3/uL — AB (ref 1.5–6.5)
Platelets: 5 10*3/uL — CL (ref 145–400)
RBC: 3.48 10*6/uL — ABNORMAL LOW (ref 3.70–5.45)
RDW: 14.1 % (ref 11.2–14.5)
WBC: 0.8 10*3/uL — CL (ref 3.9–10.3)
lymph#: 0.3 10*3/uL — ABNORMAL LOW (ref 0.9–3.3)
nRBC: 0 % (ref 0–0)

## 2015-01-29 LAB — MAGNESIUM (CC13): Magnesium: 2 mg/dl (ref 1.5–2.5)

## 2015-01-29 MED ORDER — HEPARIN SOD (PORK) LOCK FLUSH 100 UNIT/ML IV SOLN
500.0000 [IU] | Freq: Every day | INTRAVENOUS | Status: AC | PRN
  Administered 2015-01-29: 500 [IU]
  Filled 2015-01-29: qty 5

## 2015-01-29 MED ORDER — SODIUM CHLORIDE 0.9 % IV SOLN
250.0000 mL | Freq: Once | INTRAVENOUS | Status: AC
  Administered 2015-01-29: 250 mL via INTRAVENOUS

## 2015-01-29 MED ORDER — SODIUM CHLORIDE 0.9 % IJ SOLN
10.0000 mL | INTRAMUSCULAR | Status: DC | PRN
  Administered 2015-01-29: 10 mL via INTRAVENOUS
  Filled 2015-01-29: qty 10

## 2015-01-29 MED ORDER — SODIUM CHLORIDE 0.9 % IJ SOLN
10.0000 mL | INTRAMUSCULAR | Status: DC | PRN
  Filled 2015-01-29: qty 10

## 2015-01-29 MED ORDER — SODIUM CHLORIDE 0.9 % IJ SOLN
10.0000 mL | INTRAMUSCULAR | Status: AC | PRN
  Administered 2015-01-29: 10 mL
  Filled 2015-01-29: qty 10

## 2015-01-29 MED ORDER — HEPARIN SOD (PORK) LOCK FLUSH 100 UNIT/ML IV SOLN
500.0000 [IU] | Freq: Once | INTRAVENOUS | Status: AC
  Administered 2015-01-29: 500 [IU] via INTRAVENOUS
  Filled 2015-01-29: qty 5

## 2015-01-29 NOTE — Patient Instructions (Signed)

## 2015-01-29 NOTE — Telephone Encounter (Signed)
Talked with pt & infomed that plts & WBC/ANC low & neutropenic & thrombocytopenic precautions discussed.  Set up for pheresis platelet transfusion today.

## 2015-01-29 NOTE — Telephone Encounter (Signed)
Faxed labs to [facility] @ [phone]

## 2015-01-30 LAB — PREPARE PLATELET PHERESIS: UNIT DIVISION: 0

## 2015-02-01 ENCOUNTER — Telehealth: Payer: Self-pay | Admitting: Hematology

## 2015-02-01 ENCOUNTER — Ambulatory Visit (HOSPITAL_COMMUNITY)
Admission: RE | Admit: 2015-02-01 | Discharge: 2015-02-01 | Disposition: A | Discharge status: Home / Self Care | Payer: Medicare Other | Source: Ambulatory Visit | Attending: Hematology | Admitting: Hematology

## 2015-02-01 ENCOUNTER — Other Ambulatory Visit: Payer: Self-pay | Admitting: *Deleted

## 2015-02-01 ENCOUNTER — Telehealth: Payer: Self-pay | Admitting: *Deleted

## 2015-02-01 ENCOUNTER — Other Ambulatory Visit: Payer: Self-pay | Admitting: Hematology

## 2015-02-01 ENCOUNTER — Ambulatory Visit (HOSPITAL_COMMUNITY): Admission: RE | Admit: 2015-02-01 | Payer: Medicare Other | Source: Ambulatory Visit

## 2015-02-01 ENCOUNTER — Ambulatory Visit (HOSPITAL_BASED_OUTPATIENT_CLINIC_OR_DEPARTMENT_OTHER): Payer: Medicare Other

## 2015-02-01 ENCOUNTER — Ambulatory Visit: Payer: Medicare Other

## 2015-02-01 ENCOUNTER — Other Ambulatory Visit (HOSPITAL_BASED_OUTPATIENT_CLINIC_OR_DEPARTMENT_OTHER): Payer: Medicare Other

## 2015-02-01 ENCOUNTER — Telehealth: Payer: Self-pay

## 2015-02-01 VITALS — BP 132/84 | HR 96 | Temp 99.0°F | Resp 16

## 2015-02-01 VITALS — BP 128/55 | HR 95 | Temp 98.5°F | Resp 20

## 2015-02-01 DIAGNOSIS — C9200 Acute myeloblastic leukemia, not having achieved remission: Secondary | ICD-10-CM | POA: Diagnosis not present

## 2015-02-01 DIAGNOSIS — C9500 Acute leukemia of unspecified cell type not having achieved remission: Secondary | ICD-10-CM | POA: Diagnosis not present

## 2015-02-01 DIAGNOSIS — D696 Thrombocytopenia, unspecified: Secondary | ICD-10-CM

## 2015-02-01 DIAGNOSIS — E785 Hyperlipidemia, unspecified: Secondary | ICD-10-CM | POA: Diagnosis not present

## 2015-02-01 DIAGNOSIS — D649 Anemia, unspecified: Secondary | ICD-10-CM

## 2015-02-01 DIAGNOSIS — Z452 Encounter for adjustment and management of vascular access device: Secondary | ICD-10-CM

## 2015-02-01 DIAGNOSIS — E039 Hypothyroidism, unspecified: Secondary | ICD-10-CM | POA: Diagnosis not present

## 2015-02-01 DIAGNOSIS — Z95828 Presence of other vascular implants and grafts: Secondary | ICD-10-CM

## 2015-02-01 LAB — COMPREHENSIVE METABOLIC PANEL (CC13)
ALK PHOS: 99 U/L (ref 40–150)
ALT: 20 U/L (ref 0–55)
ANION GAP: 8 meq/L (ref 3–11)
AST: 12 U/L (ref 5–34)
Albumin: 3.2 g/dL — ABNORMAL LOW (ref 3.5–5.0)
BUN: 24.3 mg/dL (ref 7.0–26.0)
CO2: 26 meq/L (ref 22–29)
CREATININE: 1.3 mg/dL — AB (ref 0.6–1.1)
Calcium: 9.2 mg/dL (ref 8.4–10.4)
Chloride: 107 mEq/L (ref 98–109)
EGFR: 42 mL/min/{1.73_m2} — ABNORMAL LOW (ref >90)
Glucose: 140 mg/dl (ref 70–140)
Potassium: 3.6 mEq/L (ref 3.5–5.1)
Sodium: 141 mEq/L (ref 136–145)
Total Bilirubin: 0.78 mg/dL (ref 0.20–1.20)
Total Protein: 6.6 g/dL (ref 6.4–8.3)

## 2015-02-01 LAB — CBC WITH DIFFERENTIAL/PLATELET
BASO%: 2.6 % — ABNORMAL HIGH (ref 0.0–2.0)
Basophils Absolute: 0 10*3/uL (ref 0.0–0.1)
EOS%: 2.6 % (ref 0.0–7.0)
Eosinophils Absolute: 0 10*3/uL (ref 0.0–0.5)
HCT: 24.6 % — ABNORMAL LOW (ref 34.8–46.6)
HEMOGLOBIN: 8.7 g/dL — AB (ref 11.6–15.9)
LYMPH%: 74.4 % — ABNORMAL HIGH (ref 14.0–49.7)
MCH: 29.3 pg (ref 25.1–34.0)
MCHC: 35.4 g/dL (ref 31.5–36.0)
MCV: 82.8 fL (ref 79.5–101.0)
MONO#: 0 10*3/uL — AB (ref 0.1–0.9)
MONO%: 7.7 % (ref 0.0–14.0)
NEUT#: 0.1 10*3/uL — CL (ref 1.5–6.5)
NEUT%: 12.7 % — ABNORMAL LOW (ref 38.4–76.8)
PLATELETS: 4 10*3/uL — AB (ref 145–400)
RBC: 2.97 10*6/uL — ABNORMAL LOW (ref 3.70–5.45)
RDW: 13.5 % (ref 11.2–14.5)
WBC: 0.4 10*3/uL — CL (ref 3.9–10.3)
lymph#: 0.3 10*3/uL — ABNORMAL LOW (ref 0.9–3.3)
nRBC: 0 % (ref 0–0)

## 2015-02-01 LAB — HOLD TUBE, BLOOD BANK

## 2015-02-01 LAB — MAGNESIUM (CC13): Magnesium: 1.9 mg/dl (ref 1.5–2.5)

## 2015-02-01 LAB — PREPARE RBC (CROSSMATCH)

## 2015-02-01 MED ORDER — FUROSEMIDE 10 MG/ML IJ SOLN: 20.0000 mg | Freq: Once | INTRAMUSCULAR | Status: DC

## 2015-02-01 MED ORDER — ACETAMINOPHEN 325 MG PO TABS: 650.0000 mg | ORAL_TABLET | Freq: Once | ORAL | Status: DC

## 2015-02-01 MED ORDER — SODIUM CHLORIDE 0.9 % IV SOLN
250.0000 mL | Freq: Once | INTRAVENOUS | Status: AC
  Administered 2015-02-01: 250 mL via INTRAVENOUS

## 2015-02-01 MED ORDER — SODIUM CHLORIDE 0.9 % IJ SOLN
10.0000 mL | INTRAMUSCULAR | Status: DC | PRN
  Administered 2015-02-01: 10 mL via INTRAVENOUS
  Filled 2015-02-01: qty 10

## 2015-02-01 MED ORDER — DIPHENHYDRAMINE HCL 25 MG PO CAPS: 25.0000 mg | ORAL_CAPSULE | Freq: Once | ORAL | Status: DC

## 2015-02-01 MED ORDER — HEPARIN SOD (PORK) LOCK FLUSH 100 UNIT/ML IV SOLN
500.0000 [IU] | Freq: Once | INTRAVENOUS | Status: AC
Start: 2015-02-01 — End: 2015-02-01
  Administered 2015-02-01: 500 [IU] via INTRAVENOUS
  Filled 2015-02-01: qty 5

## 2015-02-01 MED ORDER — HEPARIN SOD (PORK) LOCK FLUSH 100 UNIT/ML IV SOLN
500.0000 [IU] | Freq: Once | INTRAVENOUS | Status: AC
  Administered 2015-02-01: 500 [IU]
  Filled 2015-02-01: qty 5

## 2015-02-01 MED ORDER — SODIUM CHLORIDE 0.9 % IJ SOLN
10.0000 mL | Freq: Once | INTRAMUSCULAR | Status: AC
  Administered 2015-02-01: 10 mL

## 2015-02-01 NOTE — Telephone Encounter (Signed)
Per staff message and POF I have scheduled appts. Advised scheduler of appts and to move lab/flush appts. Advised that 8/1 MD appt to late for treatment, need to be in treatment room by 11am. JMW

## 2015-02-01 NOTE — Patient Instructions (Signed)

## 2015-02-01 NOTE — Telephone Encounter (Signed)
Yes, she needs plt transfusion on Saturday, please let her know   Truitt Merle

## 2015-02-01 NOTE — Telephone Encounter (Signed)
per pof to sch pt appt-gave pt copy of avs-sent MW email to sch pt trmt per pof-adv pt will call after reply

## 2015-02-01 NOTE — Telephone Encounter (Signed)
Spoke with pt and informed pt of appt for platelet transfusion at 0830 am for Sat 02/03/15.   Confirmed lab and office visit appts with pt for [facility] 02/05/15.  Reinforced neutropenic precautions with pt.  Pt understood to go to ER if she develops increased temp, signs of bleeding, and or shortness of breath.

## 2015-02-01 NOTE — Progress Notes (Signed)
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  Alert, oriented and ambulatory.

## 2015-02-01 NOTE — Telephone Encounter (Signed)
Pt called asking about her appt on Saturday. She did receive 2 units of blood and 1 unit of platelets at [facility] today. Does she get another unit of platelets on Saturday? She can be called tomorrow.

## 2015-02-01 NOTE — Telephone Encounter (Signed)
Dr. Burr Medico reviewed all lab result done today 02/01/15.  Spoke with pt about receiving blood and platelet transfusions today at [place].   Faxed all lab results today to Dorise Hiss at [facility] and spoke with Jonelle Sidle, RN and informed her of pt's abnormal lab results.  Jonelle Sidle stated she would relay message to [place], Therapist, sports.

## 2015-02-01 NOTE — Telephone Encounter (Signed)
Called pt on cell phone and spoke with husband Sonia Side since pt is currently receiving blood transfusion at [place].   Carleene Mains lab results today; asked Sonia Side to instruct pt to monitor for neutropenic precautions - increased temp, shortness of breath, any signs of bleeding - pt needs to go to ER immediately for evaluation.  Pt was prescribed Levaquin by another provider Sonia Side was not sure ).  Instructed Sonia Side to have pt continue taking Levaquin until course completed. Carleene Mains pt's appts for lab and office visit with Dr. Burr Medico on Mon 8/1.   Carleene Mains appt for pt to receive platelets on Sat 02/03/15.   Sonia Side voiced understanding.

## 2015-02-02 ENCOUNTER — Telehealth: Payer: Self-pay | Admitting: Hematology

## 2015-02-02 ENCOUNTER — Telehealth: Payer: Self-pay | Admitting: *Deleted

## 2015-02-02 ENCOUNTER — Other Ambulatory Visit: Payer: Self-pay | Admitting: *Deleted

## 2015-02-02 DIAGNOSIS — C9200 Acute myeloblastic leukemia, not having achieved remission: Secondary | ICD-10-CM

## 2015-02-02 LAB — TYPE AND SCREEN
ABO/RH(D): A POS
Antibody Screen: NEGATIVE
UNIT DIVISION: 0
Unit division: 0

## 2015-02-02 NOTE — Telephone Encounter (Signed)
Informed pt she does need platelets on Saturday

## 2015-02-02 NOTE — Telephone Encounter (Signed)
Per staff message and POF I have scheduled appts. Advised scheduler of appts and first available given. JMW  

## 2015-02-02 NOTE — Telephone Encounter (Signed)
per pof to sch pt appt-sent Dr Burr Medico per Sharyn Lull to adv pt need to be in Infusion by 11-awaiting reply by Dr Burr Medico so I can call pt to advise of appt on 8/1-

## 2015-02-02 NOTE — Telephone Encounter (Signed)
per reply from Michelle-cld pt to adv to be here 8/1 @9  for labs/flush/MDadv inf @ 11:30-pt understood-adv Dr Burr Medico workrd appt in and may have a delay

## 2015-02-03 ENCOUNTER — Ambulatory Visit (HOSPITAL_BASED_OUTPATIENT_CLINIC_OR_DEPARTMENT_OTHER): Payer: Medicare Other

## 2015-02-03 VITALS — BP 117/60 | HR 76 | Temp 96.9°F | Resp 19

## 2015-02-03 DIAGNOSIS — C9200 Acute myeloblastic leukemia, not having achieved remission: Secondary | ICD-10-CM | POA: Diagnosis not present

## 2015-02-03 MED ORDER — HEPARIN SOD (PORK) LOCK FLUSH 100 UNIT/ML IV SOLN
250.0000 [IU] | INTRAVENOUS | Status: AC | PRN
  Administered 2015-02-03: 500 [IU]
  Filled 2015-02-03: qty 5

## 2015-02-03 MED ORDER — SODIUM CHLORIDE 0.9 % IJ SOLN
10.0000 mL | INTRAMUSCULAR | Status: AC | PRN
  Administered 2015-02-03: 10 mL
  Filled 2015-02-03: qty 10

## 2015-02-03 MED ORDER — SODIUM CHLORIDE 0.9 % IV SOLN
250.0000 mL | Freq: Once | INTRAVENOUS | Status: AC
  Administered 2015-02-03: 250 mL via INTRAVENOUS

## 2015-02-03 NOTE — Patient Instructions (Signed)
Platelet Transfusion Information °This is information about transfusions of platelets. Platelets are tiny cells made by the bone marrow and found in the blood. When a blood vessel is damaged, platelets rush to the damaged area to help form a clot. This begins the healing process. When platelets get very low, your blood may have trouble clotting. This may be from: °· Illness. °· Blood disorder. °· Chemotherapy to treat cancer. °Often, lower platelet counts do not cause problems.  °Platelets usually last for 7 to 10 days. If they are not used in an injury, they are broken down by the liver or spleen. °Symptoms of low platelet count include: °· Nosebleeds. °· Bleeding gums. °· Heavy periods. °· Bruising and tiny blood spots in the skin. °¨ Pinpoint spots of bleeding (petechiae). °¨ Larger bruises (purpura). °· Bleeding can be more serious if it happens in the brain or bowel. °Platelet transfusions are often used to keep the platelet count at an acceptable level. Serious bleeding due to low platelets is uncommon. °RISKS AND COMPLICATIONS °Severe side effects from platelet transfusions are uncommon. Minor reactions may include: °· Itching. °· Rashes. °· High temperature and shivering. °Medications are available to stop transfusion reactions. Let your health care provider know if you develop any of the above problems.  °If you are having platelet transfusions frequently, they may get less effective. This is called becoming refractory to platelets. It is uncommon. This can happen from non-immune causes and immune causes. Non-immune causes include: °· High temperatures. °· Some medications. °· An enlarged spleen. °Immune causes happen when your body discovers the platelets are not your own and begins making antibodies against them. The antibodies kill the platelets quickly. Even with platelet transfusions, you may still notice problems with bleeding or bruising. Let your health care providers know about this. Other things  can be done to help if this happens.  °BEFORE THE PROCEDURE  °· Your health care provider will check your platelet count regularly. °· If the platelet count is too low, it may be necessary to have a platelet transfusion. °· This is more important before certain procedures with a risk of bleeding, such as a spinal tap. °· Platelet transfusion reduces the risk of bleeding during or after the procedure. °· Except in emergencies, giving a transfusion requires a written consent. °Before blood is taken from a donor, a complete history is taken to make sure the person has no history of previous diseases, nor engages in risky social behavior. Examples of this are intravenous drug use or sexual activity with multiple partners. This could lead to infected blood or blood products being used. This history is taken in spite of the extensive testing to make sure the blood is safe. All blood products transfused are tested to make sure it is a match for the person getting the blood. It is also checked for infections. Blood is the safest it has ever been. The risk of getting an infection is very low. °PROCEDURE °· The platelets are stored in small plastic bags that are kept at a low temperature. °· Each bag is called a unit and sometimes two units are given. They are given through an intravenous line by drip infusion over about one-half hour. °· Usually blood is collected from multiple people to get enough to transfuse. °· Sometimes, the platelets are collected from a single person. This is done using a special machine that separates the platelets from the blood. The machine is called an apheresis machine. Platelets collected in this   way are called apheresed platelets. Apheresed platelets reduce the risk of becoming sensitive to the platelets. This lowers the chances of having a transfusion reaction. °· As it only takes a short time to give the platelets, this treatment can be given in an outpatient department. Platelets can also be  given before or after other treatments. °SEEK IMMEDIATE MEDICAL CARE IF: °You have any of the following symptoms over the next 12 hours or several days: °· Shaking chills. °· Fever with a temperature greater than 102°F (38.9°C) develops. °· Back pain or muscle pain. °· People around you feel you are not acting correctly, or you are confused. °· Blood in the urine or bowel movements, or bleeding from any place in your body. °· Shortness of breath, or difficulty breathing. °· Dizziness. °· Fainting. °· You break out in a rash or develop hives. °· Decrease in the amount of urine you are putting out, or the urine turns a dark color or changes to pink, red, or brown. °· A severe headache or stiff neck. °· Bruising more easily. °Document Released: 04/20/2007 Document Revised: 11/07/2013 Document Reviewed: 04/20/2007 °ExitCare® Patient Information ©2015 [employer]. This information is not intended to replace advice given to you by your health care provider. Make sure you discuss any questions you have with your health care provider. ° °

## 2015-02-05 ENCOUNTER — Other Ambulatory Visit (HOSPITAL_BASED_OUTPATIENT_CLINIC_OR_DEPARTMENT_OTHER): Payer: Medicare Other

## 2015-02-05 ENCOUNTER — Telehealth: Payer: Self-pay | Admitting: *Deleted

## 2015-02-05 ENCOUNTER — Ambulatory Visit: Payer: Medicare Other | Admitting: Hematology

## 2015-02-05 ENCOUNTER — Other Ambulatory Visit: Payer: Medicare Other | Admitting: Hematology

## 2015-02-05 ENCOUNTER — Encounter: Payer: Self-pay | Admitting: Hematology

## 2015-02-05 ENCOUNTER — Ambulatory Visit (HOSPITAL_COMMUNITY)
Admission: RE | Admit: 2015-02-05 | Discharge: 2015-02-05 | Disposition: A | Discharge status: Home / Self Care | Payer: Medicare Other | Source: Ambulatory Visit | Attending: Hematology | Admitting: Hematology

## 2015-02-05 ENCOUNTER — Ambulatory Visit (HOSPITAL_BASED_OUTPATIENT_CLINIC_OR_DEPARTMENT_OTHER): Payer: Medicare Other | Admitting: Hematology

## 2015-02-05 ENCOUNTER — Other Ambulatory Visit: Payer: PRIVATE HEALTH INSURANCE

## 2015-02-05 ENCOUNTER — Ambulatory Visit: Payer: Medicare Other

## 2015-02-05 VITALS — BP 145/76 | HR 100 | Temp 99.2°F | Resp 18 | Ht 60.0 in | Wt 155.5 lb

## 2015-02-05 DIAGNOSIS — D649 Anemia, unspecified: Secondary | ICD-10-CM | POA: Diagnosis not present

## 2015-02-05 DIAGNOSIS — Z452 Encounter for adjustment and management of vascular access device: Secondary | ICD-10-CM

## 2015-02-05 DIAGNOSIS — E876 Hypokalemia: Secondary | ICD-10-CM

## 2015-02-05 DIAGNOSIS — C9200 Acute myeloblastic leukemia, not having achieved remission: Secondary | ICD-10-CM

## 2015-02-05 DIAGNOSIS — C9201 Acute myeloblastic leukemia, in remission: Secondary | ICD-10-CM | POA: Diagnosis not present

## 2015-02-05 DIAGNOSIS — Z95828 Presence of other vascular implants and grafts: Secondary | ICD-10-CM

## 2015-02-05 LAB — PREPARE PLATELET PHERESIS
UNIT DIVISION: 0
Unit division: 0

## 2015-02-05 LAB — COMPREHENSIVE METABOLIC PANEL (CC13)
ALK PHOS: 105 U/L (ref 40–150)
ALT: 26 U/L (ref 0–55)
AST: 13 U/L (ref 5–34)
Albumin: 3.3 g/dL — ABNORMAL LOW (ref 3.5–5.0)
Anion Gap: 7 mEq/L (ref 3–11)
BILIRUBIN TOTAL: 0.55 mg/dL (ref 0.20–1.20)
BUN: 20.1 mg/dL (ref 7.0–26.0)
CO2: 28 mEq/L (ref 22–29)
Calcium: 9.5 mg/dL (ref 8.4–10.4)
Chloride: 107 mEq/L (ref 98–109)
Creatinine: 1.2 mg/dL — ABNORMAL HIGH (ref 0.6–1.1)
EGFR: 47 mL/min/{1.73_m2} — ABNORMAL LOW (ref >90)
Glucose: 142 mg/dl — ABNORMAL HIGH (ref 70–140)
Potassium: 3.4 mEq/L — ABNORMAL LOW (ref 3.5–5.1)
Sodium: 143 mEq/L (ref 136–145)
TOTAL PROTEIN: 6.7 g/dL (ref 6.4–8.3)

## 2015-02-05 LAB — CBC WITH DIFFERENTIAL/PLATELET
BASO%: 0 % (ref 0.0–2.0)
BASOS ABS: 0 10*3/uL (ref 0.0–0.1)
EOS%: 1.8 % (ref 0.0–7.0)
Eosinophils Absolute: 0 10*3/uL (ref 0.0–0.5)
HCT: 31.2 % — ABNORMAL LOW (ref 34.8–46.6)
HEMOGLOBIN: 10.9 g/dL — AB (ref 11.6–15.9)
LYMPH#: 0.4 10*3/uL — AB (ref 0.9–3.3)
LYMPH%: 17 % (ref 14.0–49.7)
MCH: 29.1 pg (ref 25.1–34.0)
MCHC: 34.9 g/dL (ref 31.5–36.0)
MCV: 83.4 fL (ref 79.5–101.0)
MONO#: 0.2 10*3/uL (ref 0.1–0.9)
MONO%: 9 % (ref 0.0–14.0)
NEUT#: 1.6 10*3/uL (ref 1.5–6.5)
NEUT%: 72.2 % (ref 38.4–76.8)
Platelets: 26 10*3/uL — ABNORMAL LOW (ref 145–400)
RBC: 3.74 10*6/uL (ref 3.70–5.45)
RDW: 13.2 % (ref 11.2–14.5)
WBC: 2.2 10*3/uL — ABNORMAL LOW (ref 3.9–10.3)

## 2015-02-05 LAB — HOLD TUBE, BLOOD BANK

## 2015-02-05 LAB — MAGNESIUM (CC13): Magnesium: 2 mg/dl (ref 1.5–2.5)

## 2015-02-05 MED ORDER — SODIUM CHLORIDE 0.9 % IJ SOLN
10.0000 mL | Freq: Once | INTRAMUSCULAR | Status: AC
  Administered 2015-02-05: 10 mL
  Filled 2015-02-05: qty 10

## 2015-02-05 MED ORDER — POTASSIUM CHLORIDE ER 10 MEQ PO TBCR: 10.0000 meq | EXTENDED_RELEASE_TABLET | Freq: Every day | ORAL | Status: DC

## 2015-02-05 MED ORDER — HEPARIN SOD (PORK) LOCK FLUSH 100 UNIT/ML IV SOLN
500.0000 [IU] | Freq: Once | INTRAVENOUS | Status: AC
  Administered 2015-02-05: 500 [IU]
  Filled 2015-02-05: qty 5

## 2015-02-05 MED ORDER — SODIUM CHLORIDE 0.9 % IJ SOLN
10.0000 mL | INTRAMUSCULAR | Status: DC | PRN
  Administered 2015-02-05: 10 mL via INTRAVENOUS
  Filled 2015-02-05: qty 10

## 2015-02-05 NOTE — Telephone Encounter (Signed)
Pt saw Dr. Burr Medico today.  Faxed all lab results done today to Dorise Hiss, RN at [facility]  Phone    [phone]     ;     Fax     [phone].

## 2015-02-05 NOTE — Progress Notes (Signed)
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 Myeloid panel PENDING.  11/15/2014 - Chemotherapy Induction with cytarabine and daunorubicin (7+3), nadir bmbx 11/28/14 showing residual disease  11/29/2014 - Chemotherapy Re-induction with cytarabine and daunorubicin (5+2); nadir bmbx 12/11/14 negative  12/28/2014 Remission Recovery bmbx c/w remission with incomplete platelet recovery (CRi)  01/17/2015 - Chemotherapy C1 Hidac 1gm/m2 + Neulasta   INTERVAL HISTORY: Miranda Mcguire returns for follow-up. She received blood transfusion once and platelet transfusion 3 times last week, when her platelets was less than 10K. She had a small bruise on the left thigh after a mild injury, no other spontaneous bleeding or skin bruise. She denies any fever or chills, has been on prophylactic antibiotics. She has mild fatigue, function well at home. No other complaints. She is scheduled to see Dr. Lissa Merlin at [facility] next Wednesday.     REVIEW OF SYSTEMS:   Constitutional: Denies fevers, chills or abnormal weight loss, (+) fatigue  Eyes: Denies blurriness of vision Ears, nose, mouth, throat, and face: Denies mucositis or sore throat Respiratory: Denies cough, dyspnea or wheezes Cardiovascular: Denies palpitation, chest discomfort or lower extremity swelling Gastrointestinal:  Denies nausea, heartburn or change in bowel habits Skin: Denies abnormal skin rashes Lymphatics: Denies new lymphadenopathy or easy bruising Neurological:Denies numbness, tingling or new weaknesses Behavioral/Psych: Mood is stable, no new changes  All other systems were reviewed with the patient and are  negative.  MEDICAL HISTORY:  Past Medical History  Diagnosis Date  . Allergy   . Anemia     nos  . Hyperlipidemia   . Hypertension   . OA (osteoarthritis)   . OP (osteoporosis)     last dexa 08-15-08  . Endometrial polyp     post-menopausal bleeding    SURGICAL HISTORY: Past Surgical History  Procedure Laterality Date  . Tonsilectomy, adenoidectomy, bilateral myringotomy and tubes    . Herniated disc      L4-5 per Dr Saintclair Halsted  . Endometrial polypectomy      with D and C 03-08-09 pr dr Gardenia Phlegm  . Colonoscopy  02-14-08    per Dr. Ardis Hughs, repeat in 5 yrs  . Dilation and curettage of uterus      I have reviewed the social history and family history with the patient and they are unchanged from previous note.  ALLERGIES:  is allergic to penicillins and aspirin.  MEDICATIONS:  Current Outpatient Prescriptions  Medication Sig Dispense Refill  . amLODipine (NORVASC) 10 MG tablet Take 10 mg by mouth daily.    Marland Kitchen atorvastatin (LIPITOR) 20 MG tablet TAKE 1 TABLET (20 MG TOTAL) BY MOUTH DAILY. 90 tablet 2  . B Complex Vitamins (VITAMIN B COMPLEX PO) Take 1 tablet by mouth daily.     Marland Kitchen CALCIUM-VITAMIN D PO Take 1 tablet by mouth daily.     . fish oil-omega-3 fatty acids 1000 MG capsule Take 1 g by mouth daily.      . fluconazole (DIFLUCAN) 200 MG tablet Take 200 mg by mouth daily.    . furosemide (LASIX) 40 MG tablet Take 40 mg by mouth as needed.    . Garlic TABS Take 1 tablet by mouth daily.     Marland Kitchen  levofloxacin (LEVAQUIN) 500 MG tablet Take 500 mg by mouth daily.    Marland Kitchen levothyroxine (SYNTHROID, LEVOTHROID) 50 MCG tablet TAKE 1 TABLET (50 MCG TOTAL) BY MOUTH DAILY. 90 tablet 2  . lidocaine-prilocaine (EMLA) cream Apply a small amount to skin over PAC 30-45 minutes prior to access.    . loratadine (CLARITIN) 10 MG tablet Take 10 mg by mouth daily.    Marland Kitchen MAGNESIUM PO Take 1 tablet by mouth daily.    . Metoprolol Tartrate 37.5 MG TABS Take 37.5 mg by mouth 2 (two) times daily.    . Multiple  Vitamins-Minerals (ICAPS PO) Take 1 tablet by mouth daily. macavue    . nabumetone (RELAFEN) 750 MG tablet TAKE 1 TABLET (750 MG TOTAL) BY MOUTH 2 (TWO) TIMES DAILY. (Patient not taking: Reported on 01/24/2015) 180 tablet 2  . ondansetron (ZOFRAN) 8 MG tablet Take 8 mg by mouth every 8 (eight) hours.    . potassium chloride (K-DUR) 10 MEQ tablet Take 1 tablet (10 mEq total) by mouth daily. 7 tablet 0   No current facility-administered medications for this visit.    PHYSICAL EXAMINATION: ECOG PERFORMANCE STATUS: 2 - Symptomatic, <50% confined to bed  Filed Vitals:   02/05/15 1020  BP: 145/76  Pulse: 100  Temp: 99.2 F (37.3 C)  Resp: 18   Filed Weights   02/05/15 1020  Weight: 155 lb 8 oz (70.534 kg)    GENERAL:alert, no distress and comfortable SKIN: skin color, texture, turgor are normal, no rashes or significant lesions, except a small ecchymosis and subcutaneous hematoma at the lateral of her left thigh EYES: normal, Conjunctiva are pink and non-injected, sclera clear OROPHARYNX:no exudate, no erythema and lips, buccal mucosa, and tongue normal  NECK: supple, thyroid normal size, non-tender, without nodularity LYMPH:  no palpable lymphadenopathy in the cervical, axillary or inguinal LUNGS: clear to auscultation and percussion with normal breathing effort HEART: regular rate & rhythm and no murmurs and no lower extremity edema ABDOMEN:abdomen soft, non-tender and normal bowel sounds Musculoskeletal:no cyanosis of digits and no clubbing  NEURO: alert & oriented x 3 with fluent speech, no focal motor/sensory deficits  LABORATORY DATA:  I have reviewed the data as listed CBC Latest Ref Rng 02/05/2015 02/01/2015 01/29/2015  WBC 3.9 - 10.3 10e3/uL 2.2(L) 0.4(LL) 0.8(LL)  Hemoglobin 11.6 - 15.9 g/dL 10.9(L) 8.7(L) 10.3(L)  Hematocrit 34.8 - 46.6 % 31.2(L) 24.6(L) 29.2(L)  Platelets 145 - 400 10e3/uL 26(L) 4(LL) 5(LL)     CMP Latest Ref Rng 02/05/2015 02/01/2015 01/29/2015  Glucose  70 - 140 mg/dl 142(H) 140 127  BUN 7.0 - 26.0 mg/dL 20.1 24.3 30.7(H)  Creatinine 0.6 - 1.1 mg/dL 1.2(H) 1.3(H) 1.2(H)  Sodium 136 - 145 mEq/L 143 141 144  Potassium 3.5 - 5.1 mEq/L 3.4(L) 3.6 4.0  Chloride 96 - 112 mEq/L - - -  CO2 22 - 29 mEq/L _0 Calcium 8.4 - 10.4 mg/dL 9.5 9.2 9.5  Total Protein 6.4 - 8.3 g/dL 6.7 6.6 6.9  Total Bilirubin 0.20 - 1.20 mg/dL 0.55 0.78 1.34(H)  Alkaline Phos 40 - 150 U/L 105 99 118  AST 5 - 34 U/L _1 ALT 0 - 55 U/L _2 ANC 1.6K today    RADIOGRAPHIC STUDIES: I have personally reviewed the radiological images as listed and agreed with the findings in the report. No results found.   ASSESSMENT & PLAN:  69 year old Caucasian female  1. AML, in remission, normal cytogenetics -She  is currently status post her first cycle of Hidac Lab reviewed with her. Her neutropenia has resolved, platelets went up to 26K today, anemia improved after blood transfusion -She does not need a blood transfusion today. She has received 4 units of RBC, 4 units of pheresed platelet so far.  -She will return in 3 days and next Monday to repeat lab.  -Continue prophylactic antibodies -follow up at [facility] on 8/10 -I probably will see her back after her second round of Hidac  2. Hypokalemia -I given her KCL 61mq daily for 7 days  ll questions were answered. The patient knows to call the clinic with any problems, questions or concerns. No barriers to learning was detected.  I spent 20 minutes counseling the patient face to face. The total time spent in the appointment was 25 minutes and more than 50% was on counseling and review of test results     FTruitt Merle MD 02/05/2015 2:05 PM

## 2015-02-05 NOTE — Patient Instructions (Signed)

## 2015-02-06 ENCOUNTER — Other Ambulatory Visit: Payer: Self-pay | Admitting: *Deleted

## 2015-02-08 ENCOUNTER — Ambulatory Visit (HOSPITAL_BASED_OUTPATIENT_CLINIC_OR_DEPARTMENT_OTHER): Payer: Medicare Other

## 2015-02-08 ENCOUNTER — Other Ambulatory Visit: Payer: Self-pay | Admitting: Hematology

## 2015-02-08 ENCOUNTER — Other Ambulatory Visit: Payer: PRIVATE HEALTH INSURANCE

## 2015-02-08 ENCOUNTER — Ambulatory Visit: Payer: Medicare Other

## 2015-02-08 ENCOUNTER — Other Ambulatory Visit (HOSPITAL_BASED_OUTPATIENT_CLINIC_OR_DEPARTMENT_OTHER): Payer: Medicare Other

## 2015-02-08 VITALS — BP 125/48 | HR 88 | Temp 97.5°F | Resp 18

## 2015-02-08 DIAGNOSIS — D649 Anemia, unspecified: Secondary | ICD-10-CM

## 2015-02-08 DIAGNOSIS — Z95828 Presence of other vascular implants and grafts: Secondary | ICD-10-CM

## 2015-02-08 DIAGNOSIS — C9200 Acute myeloblastic leukemia, not having achieved remission: Secondary | ICD-10-CM

## 2015-02-08 DIAGNOSIS — Z452 Encounter for adjustment and management of vascular access device: Secondary | ICD-10-CM | POA: Diagnosis not present

## 2015-02-08 DIAGNOSIS — C9201 Acute myeloblastic leukemia, in remission: Secondary | ICD-10-CM

## 2015-02-08 DIAGNOSIS — E876 Hypokalemia: Secondary | ICD-10-CM | POA: Diagnosis not present

## 2015-02-08 LAB — CBC WITH DIFFERENTIAL/PLATELET
BASO%: 0 % (ref 0.0–2.0)
BASOS ABS: 0 10*3/uL (ref 0.0–0.1)
EOS ABS: 0 10*3/uL (ref 0.0–0.5)
EOS%: 0.3 % (ref 0.0–7.0)
HCT: 29.1 % — ABNORMAL LOW (ref 34.8–46.6)
HGB: 10.4 g/dL — ABNORMAL LOW (ref 11.6–15.9)
LYMPH#: 0.6 10*3/uL — AB (ref 0.9–3.3)
LYMPH%: 17.7 % (ref 14.0–49.7)
MCH: 29.5 pg (ref 25.1–34.0)
MCHC: 35.7 g/dL (ref 31.5–36.0)
MCV: 82.7 fL (ref 79.5–101.0)
MONO#: 0.3 10*3/uL (ref 0.1–0.9)
MONO%: 10.6 % (ref 0.0–14.0)
NEUT#: 2.2 10*3/uL (ref 1.5–6.5)
NEUT%: 71.4 % (ref 38.4–76.8)
Platelets: 14 10*3/uL — ABNORMAL LOW (ref 145–400)
RBC: 3.52 10*6/uL — ABNORMAL LOW (ref 3.70–5.45)
RDW: 13 % (ref 11.2–14.5)
WBC: 3.1 10*3/uL — AB (ref 3.9–10.3)

## 2015-02-08 LAB — COMPREHENSIVE METABOLIC PANEL (CC13)
ALT: 32 U/L (ref 0–55)
AST: 17 U/L (ref 5–34)
Albumin: 3.3 g/dL — ABNORMAL LOW (ref 3.5–5.0)
Alkaline Phosphatase: 112 U/L (ref 40–150)
Anion Gap: 7 mEq/L (ref 3–11)
BILIRUBIN TOTAL: 0.39 mg/dL (ref 0.20–1.20)
BUN: 23.4 mg/dL (ref 7.0–26.0)
CHLORIDE: 109 meq/L (ref 98–109)
CO2: 27 meq/L (ref 22–29)
CREATININE: 1 mg/dL (ref 0.6–1.1)
Calcium: 9.2 mg/dL (ref 8.4–10.4)
EGFR: 55 mL/min/{1.73_m2} — ABNORMAL LOW (ref >90)
GLUCOSE: 127 mg/dL (ref 70–140)
Potassium: 3.5 mEq/L (ref 3.5–5.1)
SODIUM: 143 meq/L (ref 136–145)
Total Protein: 6.5 g/dL (ref 6.4–8.3)

## 2015-02-08 LAB — HOLD TUBE, BLOOD BANK

## 2015-02-08 MED ORDER — SODIUM CHLORIDE 0.9 % IV SOLN: 250.0000 mL | Freq: Once | INTRAVENOUS | Status: DC

## 2015-02-08 MED ORDER — SODIUM CHLORIDE 0.9 % IJ SOLN
10.0000 mL | INTRAMUSCULAR | Status: DC | PRN
  Administered 2015-02-08: 10 mL via INTRAVENOUS
  Filled 2015-02-08: qty 10

## 2015-02-08 MED ORDER — HEPARIN SOD (PORK) LOCK FLUSH 100 UNIT/ML IV SOLN
500.0000 [IU] | Freq: Every day | INTRAVENOUS | Status: AC | PRN
  Administered 2015-02-08: 500 [IU]
  Filled 2015-02-08: qty 5

## 2015-02-08 MED ORDER — HEPARIN SOD (PORK) LOCK FLUSH 100 UNIT/ML IV SOLN
250.0000 [IU] | INTRAVENOUS | Status: DC | PRN
  Filled 2015-02-08: qty 5

## 2015-02-08 MED ORDER — SODIUM CHLORIDE 0.9 % IJ SOLN
10.0000 mL | INTRAMUSCULAR | Status: AC | PRN
  Administered 2015-02-08: 10 mL
  Filled 2015-02-08: qty 10

## 2015-02-08 NOTE — Patient Instructions (Signed)

## 2015-02-08 NOTE — Patient Instructions (Signed)
Platelet Transfusion Information °This is information about transfusions of platelets. Platelets are tiny cells made by the bone marrow and found in the blood. When a blood vessel is damaged, platelets rush to the damaged area to help form a clot. This begins the healing process. When platelets get very low, your blood may have trouble clotting. This may be from: °· Illness. °· Blood disorder. °· Chemotherapy to treat cancer. °Often, lower platelet counts do not cause problems.  °Platelets usually last for 7 to 10 days. If they are not used in an injury, they are broken down by the liver or spleen. °Symptoms of low platelet count include: °· Nosebleeds. °· Bleeding gums. °· Heavy periods. °· Bruising and tiny blood spots in the skin. °¨ Pinpoint spots of bleeding (petechiae). °¨ Larger bruises (purpura). °· Bleeding can be more serious if it happens in the brain or bowel. °Platelet transfusions are often used to keep the platelet count at an acceptable level. Serious bleeding due to low platelets is uncommon. °RISKS AND COMPLICATIONS °Severe side effects from platelet transfusions are uncommon. Minor reactions may include: °· Itching. °· Rashes. °· High temperature and shivering. °Medications are available to stop transfusion reactions. Let your health care provider know if you develop any of the above problems.  °If you are having platelet transfusions frequently, they may get less effective. This is called becoming refractory to platelets. It is uncommon. This can happen from non-immune causes and immune causes. Non-immune causes include: °· High temperatures. °· Some medications. °· An enlarged spleen. °Immune causes happen when your body discovers the platelets are not your own and begins making antibodies against them. The antibodies kill the platelets quickly. Even with platelet transfusions, you may still notice problems with bleeding or bruising. Let your health care providers know about this. Other things  can be done to help if this happens.  °BEFORE THE PROCEDURE  °· Your health care provider will check your platelet count regularly. °· If the platelet count is too low, it may be necessary to have a platelet transfusion. °· This is more important before certain procedures with a risk of bleeding, such as a spinal tap. °· Platelet transfusion reduces the risk of bleeding during or after the procedure. °· Except in emergencies, giving a transfusion requires a written consent. °Before blood is taken from a donor, a complete history is taken to make sure the person has no history of previous diseases, nor engages in risky social behavior. Examples of this are intravenous drug use or sexual activity with multiple partners. This could lead to infected blood or blood products being used. This history is taken in spite of the extensive testing to make sure the blood is safe. All blood products transfused are tested to make sure it is a match for the person getting the blood. It is also checked for infections. Blood is the safest it has ever been. The risk of getting an infection is very low. °PROCEDURE °· The platelets are stored in small plastic bags that are kept at a low temperature. °· Each bag is called a unit and sometimes two units are given. They are given through an intravenous line by drip infusion over about one-half hour. °· Usually blood is collected from multiple people to get enough to transfuse. °· Sometimes, the platelets are collected from a single person. This is done using a special machine that separates the platelets from the blood. The machine is called an apheresis machine. Platelets collected in this   way are called apheresed platelets. Apheresed platelets reduce the risk of becoming sensitive to the platelets. This lowers the chances of having a transfusion reaction. °· As it only takes a short time to give the platelets, this treatment can be given in an outpatient department. Platelets can also be  given before or after other treatments. °SEEK IMMEDIATE MEDICAL CARE IF: °You have any of the following symptoms over the next 12 hours or several days: °· Shaking chills. °· Fever with a temperature greater than 102°F (38.9°C) develops. °· Back pain or muscle pain. °· People around you feel you are not acting correctly, or you are confused. °· Blood in the urine or bowel movements, or bleeding from any place in your body. °· Shortness of breath, or difficulty breathing. °· Dizziness. °· Fainting. °· You break out in a rash or develop hives. °· Decrease in the amount of urine you are putting out, or the urine turns a dark color or changes to pink, red, or brown. °· A severe headache or stiff neck. °· Bruising more easily. °Document Released: 04/20/2007 Document Revised: 11/07/2013 Document Reviewed: 04/20/2007 °ExitCare® Patient Information ©2015 [employer]. This information is not intended to replace advice given to you by your health care provider. Make sure you discuss any questions you have with your health care provider. ° °

## 2015-02-09 ENCOUNTER — Telehealth: Payer: Self-pay | Admitting: *Deleted

## 2015-02-09 LAB — PREPARE PLATELET PHERESIS: UNIT DIVISION: 0

## 2015-02-09 NOTE — Telephone Encounter (Signed)
Labs from 02/08/15 faxed to [place] RN @ [phone] with info that pt received radiated pheresed plt 02/08/15.

## 2015-02-12 ENCOUNTER — Ambulatory Visit: Payer: Medicare Other

## 2015-02-12 ENCOUNTER — Ambulatory Visit (HOSPITAL_BASED_OUTPATIENT_CLINIC_OR_DEPARTMENT_OTHER): Payer: Medicare Other

## 2015-02-12 ENCOUNTER — Other Ambulatory Visit (HOSPITAL_BASED_OUTPATIENT_CLINIC_OR_DEPARTMENT_OTHER): Payer: Medicare Other

## 2015-02-12 ENCOUNTER — Telehealth: Payer: Self-pay | Admitting: *Deleted

## 2015-02-12 VITALS — BP 150/77 | HR 106 | Temp 98.4°F | Resp 20

## 2015-02-12 DIAGNOSIS — D649 Anemia, unspecified: Secondary | ICD-10-CM

## 2015-02-12 DIAGNOSIS — R232 Flushing: Secondary | ICD-10-CM

## 2015-02-12 DIAGNOSIS — E039 Hypothyroidism, unspecified: Secondary | ICD-10-CM

## 2015-02-12 DIAGNOSIS — C9201 Acute myeloblastic leukemia, in remission: Secondary | ICD-10-CM

## 2015-02-12 DIAGNOSIS — C9200 Acute myeloblastic leukemia, not having achieved remission: Secondary | ICD-10-CM

## 2015-02-12 DIAGNOSIS — Z452 Encounter for adjustment and management of vascular access device: Secondary | ICD-10-CM | POA: Diagnosis present

## 2015-02-12 DIAGNOSIS — Z95828 Presence of other vascular implants and grafts: Secondary | ICD-10-CM

## 2015-02-12 LAB — CBC WITH DIFFERENTIAL/PLATELET
BASO%: 0.5 % (ref 0.0–2.0)
Basophils Absolute: 0 10*3/uL (ref 0.0–0.1)
EOS%: 0.4 % (ref 0.0–7.0)
Eosinophils Absolute: 0 10*3/uL (ref 0.0–0.5)
HEMATOCRIT: 29.2 % — AB (ref 34.8–46.6)
HGB: 10.1 g/dL — ABNORMAL LOW (ref 11.6–15.9)
LYMPH#: 0.6 10*3/uL — AB (ref 0.9–3.3)
LYMPH%: 15.2 % (ref 14.0–49.7)
MCH: 28.6 pg (ref 25.1–34.0)
MCHC: 34.5 g/dL (ref 31.5–36.0)
MCV: 83 fL (ref 79.5–101.0)
MONO#: 0.5 10*3/uL (ref 0.1–0.9)
MONO%: 12.4 % (ref 0.0–14.0)
NEUT%: 71.5 % (ref 38.4–76.8)
NEUTROS ABS: 2.8 10*3/uL (ref 1.5–6.5)
Platelets: 38 10*3/uL — ABNORMAL LOW (ref 145–400)
RBC: 3.51 10*6/uL — AB (ref 3.70–5.45)
RDW: 13.5 % (ref 11.2–14.5)
WBC: 3.9 10*3/uL (ref 3.9–10.3)

## 2015-02-12 LAB — COMPREHENSIVE METABOLIC PANEL (CC13)
ALBUMIN: 3.4 g/dL — AB (ref 3.5–5.0)
ALT: 32 U/L (ref 0–55)
AST: 19 U/L (ref 5–34)
Alkaline Phosphatase: 112 U/L (ref 40–150)
Anion Gap: 6 mEq/L (ref 3–11)
BILIRUBIN TOTAL: 0.46 mg/dL (ref 0.20–1.20)
BUN: 18.8 mg/dL (ref 7.0–26.0)
CHLORIDE: 110 meq/L — AB (ref 98–109)
CO2: 27 mEq/L (ref 22–29)
Calcium: 9.3 mg/dL (ref 8.4–10.4)
Creatinine: 1 mg/dL (ref 0.6–1.1)
EGFR: 58 mL/min/{1.73_m2} — AB (ref >90)
GLUCOSE: 113 mg/dL (ref 70–140)
POTASSIUM: 4.2 meq/L (ref 3.5–5.1)
Sodium: 143 mEq/L (ref 136–145)
Total Protein: 6.7 g/dL (ref 6.4–8.3)

## 2015-02-12 LAB — HOLD TUBE, BLOOD BANK

## 2015-02-12 LAB — MAGNESIUM (CC13): Magnesium: 1.9 mg/dl (ref 1.5–2.5)

## 2015-02-12 MED ORDER — HEPARIN SOD (PORK) LOCK FLUSH 100 UNIT/ML IV SOLN
500.0000 [IU] | Freq: Once | INTRAVENOUS | Status: AC
Start: 2015-02-12 — End: 2015-02-12
  Administered 2015-02-12: 500 [IU] via INTRAVENOUS
  Filled 2015-02-12: qty 5

## 2015-02-12 MED ORDER — SODIUM CHLORIDE 0.9 % IJ SOLN
10.0000 mL | INTRAMUSCULAR | Status: DC | PRN
  Administered 2015-02-12: 10 mL via INTRAVENOUS
  Filled 2015-02-12: qty 10

## 2015-02-12 NOTE — Telephone Encounter (Signed)
Dr. Burr Medico reviewed all lab results done today 02/12/15.  Faxed results to Dorise Hiss, RN at [facility]. [facility]    Phone    [phone]    ;     Fax      [phone].

## 2015-02-12 NOTE — Progress Notes (Signed)
Per Dr. Burr Medico, no transfusion needed today. Only transfuse if platelets below 20.  1046-Pt assessed, VS stable. Deaccessed in treatment area. Discharged ambulatory, no acute distress with husband

## 2015-02-12 NOTE — Patient Instructions (Signed)

## 2015-02-14 ENCOUNTER — Telehealth: Payer: Self-pay | Admitting: *Deleted

## 2015-02-14 ENCOUNTER — Other Ambulatory Visit: Payer: Self-pay | Admitting: *Deleted

## 2015-02-14 ENCOUNTER — Telehealth: Payer: Self-pay | Admitting: Hematology

## 2015-02-14 DIAGNOSIS — C9200 Acute myeloblastic leukemia, not having achieved remission: Secondary | ICD-10-CM

## 2015-02-14 NOTE — Telephone Encounter (Signed)
TC from patient this morning. She states that she was to be admitted to [facility] for 6  Days of chemo today but it was changed to August 24th. Pt needs appts and orders for lab work for August 11, 15, 18 and 22nd ,including hold tube for possible platelet transfusions. POF sent.  Need specific lab orders for all these days.

## 2015-02-14 NOTE — Telephone Encounter (Signed)
Thu Simon Rhein, RN  Drake Leach Cc: Jesse Fall, RN; [place],  Please cancel all blood infusion appts. Pt just needs lab early am only on 02/19/15.  I made a POF after the one that Drucie Ip, RN had sent to you.  Thu.

## 2015-02-14 NOTE — Telephone Encounter (Signed)
Spoke with Jonelle Sidle, RN at [facility]  Was informed that pt's admission to hospital for chemo has been postponed to 02/28/15.  Per Boone Master, RN for Dr. Lissa Merlin had spoken with pt this am.   Received faxed lab order from Elmyra Ricks, RN at [facility] for pt to have labs drawn at our office on 02/19/15 only. Confirmed with Jonelle Sidle, RN of above lab orders were correct. Spoke with pt and informed pt of lab order for 02/19/15 as per Gery Pray, RN.  Pt voiced understanding. POF sent to scheduler.

## 2015-02-14 NOTE — Telephone Encounter (Signed)
Spoke with patient and she is aware of her lab on 8/15

## 2015-02-14 NOTE — Telephone Encounter (Signed)
Error

## 2015-02-19 ENCOUNTER — Telehealth: Payer: Self-pay | Admitting: *Deleted

## 2015-02-19 ENCOUNTER — Other Ambulatory Visit: Payer: Self-pay | Admitting: *Deleted

## 2015-02-19 ENCOUNTER — Other Ambulatory Visit (HOSPITAL_BASED_OUTPATIENT_CLINIC_OR_DEPARTMENT_OTHER): Payer: Medicare Other

## 2015-02-19 ENCOUNTER — Ambulatory Visit (HOSPITAL_BASED_OUTPATIENT_CLINIC_OR_DEPARTMENT_OTHER): Payer: Medicare Other

## 2015-02-19 ENCOUNTER — Ambulatory Visit (HOSPITAL_COMMUNITY)
Admission: RE | Admit: 2015-02-19 | Discharge: 2015-02-19 | Disposition: A | Discharge status: Home / Self Care | Payer: Medicare Other | Source: Ambulatory Visit | Attending: Hematology | Admitting: Hematology

## 2015-02-19 VITALS — BP 146/58 | HR 91 | Temp 98.0°F | Resp 20

## 2015-02-19 DIAGNOSIS — Z452 Encounter for adjustment and management of vascular access device: Secondary | ICD-10-CM | POA: Diagnosis not present

## 2015-02-19 DIAGNOSIS — C9200 Acute myeloblastic leukemia, not having achieved remission: Secondary | ICD-10-CM

## 2015-02-19 DIAGNOSIS — C9201 Acute myeloblastic leukemia, in remission: Secondary | ICD-10-CM | POA: Diagnosis not present

## 2015-02-19 LAB — CBC WITH DIFFERENTIAL/PLATELET
BASO%: 0.7 % (ref 0.0–2.0)
BASOS ABS: 0 10*3/uL (ref 0.0–0.1)
EOS%: 0.1 % (ref 0.0–7.0)
Eosinophils Absolute: 0 10*3/uL (ref 0.0–0.5)
HCT: 25.7 % — ABNORMAL LOW (ref 34.8–46.6)
HGB: 8.9 g/dL — ABNORMAL LOW (ref 11.6–15.9)
LYMPH%: 14.8 % (ref 14.0–49.7)
MCH: 28.9 pg (ref 25.1–34.0)
MCHC: 34.5 g/dL (ref 31.5–36.0)
MCV: 83.8 fL (ref 79.5–101.0)
MONO#: 0.5 10*3/uL (ref 0.1–0.9)
MONO%: 12.5 % (ref 0.0–14.0)
NEUT#: 3 10*3/uL (ref 1.5–6.5)
NEUT%: 71.9 % (ref 38.4–76.8)
Platelets: 50 10*3/uL — ABNORMAL LOW (ref 145–400)
RBC: 3.06 10*6/uL — ABNORMAL LOW (ref 3.70–5.45)
RDW: 13.4 % (ref 11.2–14.5)
WBC: 4.1 10*3/uL (ref 3.9–10.3)
lymph#: 0.6 10*3/uL — ABNORMAL LOW (ref 0.9–3.3)

## 2015-02-19 LAB — COMPREHENSIVE METABOLIC PANEL (CC13)
ALBUMIN: 3.2 g/dL — AB (ref 3.5–5.0)
ALK PHOS: 93 U/L (ref 40–150)
ALT: 30 U/L (ref 0–55)
ANION GAP: 7 meq/L (ref 3–11)
AST: 15 U/L (ref 5–34)
BUN: 21.6 mg/dL (ref 7.0–26.0)
CALCIUM: 8.9 mg/dL (ref 8.4–10.4)
CO2: 24 mEq/L (ref 22–29)
CREATININE: 1 mg/dL (ref 0.6–1.1)
Chloride: 110 mEq/L — ABNORMAL HIGH (ref 98–109)
EGFR: 60 mL/min/{1.73_m2} — AB (ref >90)
Glucose: 140 mg/dl (ref 70–140)
Potassium: 3.8 mEq/L (ref 3.5–5.1)
Sodium: 142 mEq/L (ref 136–145)
Total Bilirubin: 0.41 mg/dL (ref 0.20–1.20)
Total Protein: 6.3 g/dL — ABNORMAL LOW (ref 6.4–8.3)

## 2015-02-19 LAB — HOLD TUBE, BLOOD BANK

## 2015-02-19 LAB — MAGNESIUM (CC13): MAGNESIUM: 2 mg/dL (ref 1.5–2.5)

## 2015-02-19 LAB — PREPARE RBC (CROSSMATCH)

## 2015-02-19 MED ORDER — SODIUM CHLORIDE 0.9 % IJ SOLN
10.0000 mL | INTRAMUSCULAR | Status: DC | PRN
  Administered 2015-02-19: 10 mL via INTRAVENOUS
  Filled 2015-02-19: qty 10

## 2015-02-19 MED ORDER — SODIUM CHLORIDE 0.9 % IJ SOLN
10.0000 mL | INTRAMUSCULAR | Status: AC | PRN
  Administered 2015-02-19: 10 mL

## 2015-02-19 MED ORDER — HEPARIN SOD (PORK) LOCK FLUSH 100 UNIT/ML IV SOLN
500.0000 [IU] | Freq: Once | INTRAVENOUS | Status: AC
  Administered 2015-02-19: 500 [IU] via INTRAVENOUS
  Filled 2015-02-19: qty 5

## 2015-02-19 MED ORDER — HEPARIN SOD (PORK) LOCK FLUSH 100 UNIT/ML IV SOLN
500.0000 [IU] | Freq: Every day | INTRAVENOUS | Status: AC | PRN
  Administered 2015-02-19: 500 [IU]
  Filled 2015-02-19: qty 5

## 2015-02-19 MED ORDER — SODIUM CHLORIDE 0.9 % IV SOLN
250.0000 mL | Freq: Once | INTRAVENOUS | Status: AC
  Administered 2015-02-19: 250 mL via INTRAVENOUS

## 2015-02-19 NOTE — Progress Notes (Signed)
Associated Diagnosis: Acute Myeloid leukemia C 92.0 MD: Burr Medico  Procedure Note: Flushed port and checked for blood return, Infused  2 unit PRBCs  Condition during procedure: patient tolerated well  Condition after procedure: Porta cath flushed per protocol and deaccessed,. Alert, oriented and ambulatory

## 2015-02-19 NOTE — Telephone Encounter (Signed)
Dr. Burr Medico reviewed all lab results done today.  Faxed lab results to Gery Pray, RN at [facility]   Fax    [phone]    ;    Phone      [phone].

## 2015-02-19 NOTE — Patient Instructions (Signed)

## 2015-02-19 NOTE — Telephone Encounter (Signed)
Pt here for labs only.  Hgb  8.9 .  Spoke with pt and husband in the lobby.  Instructed pt to go to [place] for blood transfusion today at 1230 pm.  Both pt and husband voiced understanding.

## 2015-02-20 LAB — TYPE AND SCREEN
ABO/RH(D): A POS
Antibody Screen: NEGATIVE
UNIT DIVISION: 0
Unit division: 0

## 2015-02-21 ENCOUNTER — Telehealth: Payer: Self-pay | Admitting: Hematology

## 2015-02-21 ENCOUNTER — Telehealth: Payer: Self-pay | Admitting: *Deleted

## 2015-02-21 NOTE — Telephone Encounter (Signed)
per Wells Guiles faxed orders for labs to be drawn-needed fax # and wanted to sch pt 2 appts for labs to be drawn. Per CL (lab to be faxed to [phone] understood

## 2015-02-21 NOTE — Telephone Encounter (Signed)
"  I received a call from [facility] needs blood work with platelet count before admission for treatment.  [facility] was to call, make appointment and call me.  No one has called.  Please call me with an appointment time.  [phone] is return number."

## 2015-02-22 ENCOUNTER — Other Ambulatory Visit: Payer: Self-pay | Admitting: *Deleted

## 2015-02-22 ENCOUNTER — Other Ambulatory Visit (HOSPITAL_BASED_OUTPATIENT_CLINIC_OR_DEPARTMENT_OTHER): Payer: Medicare Other

## 2015-02-22 ENCOUNTER — Telehealth: Payer: Self-pay | Admitting: *Deleted

## 2015-02-22 ENCOUNTER — Ambulatory Visit (HOSPITAL_BASED_OUTPATIENT_CLINIC_OR_DEPARTMENT_OTHER): Payer: Medicare Other

## 2015-02-22 DIAGNOSIS — D649 Anemia, unspecified: Secondary | ICD-10-CM | POA: Diagnosis not present

## 2015-02-22 DIAGNOSIS — C9200 Acute myeloblastic leukemia, not having achieved remission: Secondary | ICD-10-CM

## 2015-02-22 DIAGNOSIS — C9201 Acute myeloblastic leukemia, in remission: Secondary | ICD-10-CM

## 2015-02-22 DIAGNOSIS — Z452 Encounter for adjustment and management of vascular access device: Secondary | ICD-10-CM | POA: Diagnosis not present

## 2015-02-22 DIAGNOSIS — Z95828 Presence of other vascular implants and grafts: Secondary | ICD-10-CM

## 2015-02-22 DIAGNOSIS — E876 Hypokalemia: Secondary | ICD-10-CM

## 2015-02-22 LAB — COMPREHENSIVE METABOLIC PANEL (CC13)
ALT: 31 U/L (ref 0–55)
ANION GAP: 10 meq/L (ref 3–11)
AST: 17 U/L (ref 5–34)
Albumin: 3.3 g/dL — ABNORMAL LOW (ref 3.5–5.0)
Alkaline Phosphatase: 108 U/L (ref 40–150)
BILIRUBIN TOTAL: 0.64 mg/dL (ref 0.20–1.20)
BUN: 19.1 mg/dL (ref 7.0–26.0)
CALCIUM: 9.2 mg/dL (ref 8.4–10.4)
CO2: 23 meq/L (ref 22–29)
Chloride: 110 mEq/L — ABNORMAL HIGH (ref 98–109)
Creatinine: 1 mg/dL (ref 0.6–1.1)
EGFR: 60 mL/min/{1.73_m2} — AB (ref >90)
Glucose: 113 mg/dl (ref 70–140)
Potassium: 3.9 mEq/L (ref 3.5–5.1)
Sodium: 144 mEq/L (ref 136–145)
TOTAL PROTEIN: 6.6 g/dL (ref 6.4–8.3)

## 2015-02-22 LAB — CBC WITH DIFFERENTIAL/PLATELET
BASO%: 0.2 % (ref 0.0–2.0)
BASOS ABS: 0 10*3/uL (ref 0.0–0.1)
EOS ABS: 0 10*3/uL (ref 0.0–0.5)
EOS%: 0.2 % (ref 0.0–7.0)
HCT: 33.3 % — ABNORMAL LOW (ref 34.8–46.6)
HEMOGLOBIN: 11.8 g/dL (ref 11.6–15.9)
LYMPH%: 13.3 % — AB (ref 14.0–49.7)
MCH: 29.9 pg (ref 25.1–34.0)
MCHC: 35.4 g/dL (ref 31.5–36.0)
MCV: 84.5 fL (ref 79.5–101.0)
MONO#: 0.6 10*3/uL (ref 0.1–0.9)
MONO%: 11.3 % (ref 0.0–14.0)
NEUT#: 3.7 10*3/uL (ref 1.5–6.5)
NEUT%: 75 % (ref 38.4–76.8)
PLATELETS: 42 10*3/uL — AB (ref 145–400)
RBC: 3.94 10*6/uL (ref 3.70–5.45)
RDW: 13.9 % (ref 11.2–14.5)
WBC: 5 10*3/uL (ref 3.9–10.3)
lymph#: 0.7 10*3/uL — ABNORMAL LOW (ref 0.9–3.3)

## 2015-02-22 LAB — HOLD TUBE, BLOOD BANK

## 2015-02-22 MED ORDER — HEPARIN SOD (PORK) LOCK FLUSH 100 UNIT/ML IV SOLN
500.0000 [IU] | Freq: Once | INTRAVENOUS | Status: AC
  Administered 2015-02-22: 500 [IU] via INTRAVENOUS
  Filled 2015-02-22: qty 5

## 2015-02-22 MED ORDER — SODIUM CHLORIDE 0.9 % IJ SOLN
10.0000 mL | INTRAMUSCULAR | Status: DC | PRN
  Administered 2015-02-22: 10 mL via INTRAVENOUS
  Filled 2015-02-22: qty 10

## 2015-02-22 NOTE — Telephone Encounter (Signed)
CBC & CMET results faxed to Cathie Hoops NP @ [phone].

## 2015-02-22 NOTE — Telephone Encounter (Signed)
Labs done today & results faxed to [facility].

## 2015-02-26 ENCOUNTER — Other Ambulatory Visit (HOSPITAL_BASED_OUTPATIENT_CLINIC_OR_DEPARTMENT_OTHER): Payer: Medicare Other

## 2015-02-26 ENCOUNTER — Ambulatory Visit (HOSPITAL_BASED_OUTPATIENT_CLINIC_OR_DEPARTMENT_OTHER): Payer: Medicare Other

## 2015-02-26 ENCOUNTER — Telehealth: Payer: Self-pay | Admitting: *Deleted

## 2015-02-26 DIAGNOSIS — Z452 Encounter for adjustment and management of vascular access device: Secondary | ICD-10-CM | POA: Diagnosis not present

## 2015-02-26 DIAGNOSIS — E876 Hypokalemia: Secondary | ICD-10-CM

## 2015-02-26 DIAGNOSIS — C9200 Acute myeloblastic leukemia, not having achieved remission: Secondary | ICD-10-CM

## 2015-02-26 DIAGNOSIS — D649 Anemia, unspecified: Secondary | ICD-10-CM

## 2015-02-26 DIAGNOSIS — C9201 Acute myeloblastic leukemia, in remission: Secondary | ICD-10-CM | POA: Diagnosis not present

## 2015-02-26 LAB — CBC WITH DIFFERENTIAL/PLATELET
BASO%: 0.9 % (ref 0.0–2.0)
BASOS ABS: 0 10*3/uL (ref 0.0–0.1)
EOS%: 0.4 % (ref 0.0–7.0)
Eosinophils Absolute: 0 10*3/uL (ref 0.0–0.5)
HEMATOCRIT: 31.8 % — AB (ref 34.8–46.6)
HEMOGLOBIN: 11.1 g/dL — AB (ref 11.6–15.9)
LYMPH#: 0.7 10*3/uL — AB (ref 0.9–3.3)
LYMPH%: 12.6 % — ABNORMAL LOW (ref 14.0–49.7)
MCH: 29.7 pg (ref 25.1–34.0)
MCHC: 34.8 g/dL (ref 31.5–36.0)
MCV: 85.5 fL (ref 79.5–101.0)
MONO#: 0.5 10*3/uL (ref 0.1–0.9)
MONO%: 9.9 % (ref 0.0–14.0)
NEUT%: 76.2 % (ref 38.4–76.8)
NEUTROS ABS: 4 10*3/uL (ref 1.5–6.5)
Platelets: 53 10*3/uL — ABNORMAL LOW (ref 145–400)
RBC: 3.72 10*6/uL (ref 3.70–5.45)
RDW: 13.8 % (ref 11.2–14.5)
WBC: 5.3 10*3/uL (ref 3.9–10.3)

## 2015-02-26 LAB — COMPREHENSIVE METABOLIC PANEL (CC13)
ALBUMIN: 3.1 g/dL — AB (ref 3.5–5.0)
ALK PHOS: 93 U/L (ref 40–150)
ALT: 31 U/L (ref 0–55)
AST: 17 U/L (ref 5–34)
Anion Gap: 10 mEq/L (ref 3–11)
BUN: 19.8 mg/dL (ref 7.0–26.0)
CALCIUM: 9.1 mg/dL (ref 8.4–10.4)
CO2: 24 mEq/L (ref 22–29)
CREATININE: 1 mg/dL (ref 0.6–1.1)
Chloride: 110 mEq/L — ABNORMAL HIGH (ref 98–109)
EGFR: 56 mL/min/{1.73_m2} — ABNORMAL LOW (ref >90)
GLUCOSE: 137 mg/dL (ref 70–140)
Potassium: 3.6 mEq/L (ref 3.5–5.1)
SODIUM: 144 meq/L (ref 136–145)
TOTAL PROTEIN: 6.3 g/dL — AB (ref 6.4–8.3)
Total Bilirubin: 0.64 mg/dL (ref 0.20–1.20)

## 2015-02-26 MED ORDER — HEPARIN SOD (PORK) LOCK FLUSH 100 UNIT/ML IV SOLN
500.0000 [IU] | INTRAVENOUS | Status: AC | PRN
  Administered 2015-02-26: 500 [IU]
  Filled 2015-02-26: qty 5

## 2015-02-26 MED ORDER — SODIUM CHLORIDE 0.9 % IJ SOLN
10.0000 mL | INTRAMUSCULAR | Status: AC | PRN
  Administered 2015-02-26: 10 mL
  Filled 2015-02-26: qty 10

## 2015-02-26 NOTE — Telephone Encounter (Signed)
Faxed CBC results done today to Janalyn Rouse, RN at [facility]. [facility]    Fax     [phone] ;      Phone      [phone].

## 2015-02-26 NOTE — Patient Instructions (Signed)

## 2015-02-27 ENCOUNTER — Telehealth: Payer: Self-pay | Admitting: Hematology

## 2015-02-27 ENCOUNTER — Other Ambulatory Visit: Payer: Self-pay | Admitting: *Deleted

## 2015-02-27 NOTE — Telephone Encounter (Signed)
Confirmed appointment for lab 08/25 & 08/29

## 2015-03-01 ENCOUNTER — Telehealth: Payer: Self-pay | Admitting: *Deleted

## 2015-03-01 ENCOUNTER — Ambulatory Visit (HOSPITAL_BASED_OUTPATIENT_CLINIC_OR_DEPARTMENT_OTHER): Payer: Medicare Other

## 2015-03-01 ENCOUNTER — Other Ambulatory Visit (HOSPITAL_BASED_OUTPATIENT_CLINIC_OR_DEPARTMENT_OTHER): Payer: Medicare Other

## 2015-03-01 DIAGNOSIS — C9201 Acute myeloblastic leukemia, in remission: Secondary | ICD-10-CM

## 2015-03-01 DIAGNOSIS — D649 Anemia, unspecified: Secondary | ICD-10-CM | POA: Diagnosis not present

## 2015-03-01 DIAGNOSIS — E876 Hypokalemia: Secondary | ICD-10-CM

## 2015-03-01 DIAGNOSIS — Z452 Encounter for adjustment and management of vascular access device: Secondary | ICD-10-CM | POA: Diagnosis not present

## 2015-03-01 DIAGNOSIS — Z95828 Presence of other vascular implants and grafts: Secondary | ICD-10-CM

## 2015-03-01 DIAGNOSIS — C9200 Acute myeloblastic leukemia, not having achieved remission: Secondary | ICD-10-CM

## 2015-03-01 LAB — COMPREHENSIVE METABOLIC PANEL (CC13)
ALBUMIN: 3.3 g/dL — AB (ref 3.5–5.0)
ALK PHOS: 102 U/L (ref 40–150)
ALT: 28 U/L (ref 0–55)
AST: 15 U/L (ref 5–34)
Anion Gap: 9 mEq/L (ref 3–11)
BUN: 21.1 mg/dL (ref 7.0–26.0)
CALCIUM: 9.3 mg/dL (ref 8.4–10.4)
CO2: 26 mEq/L (ref 22–29)
Chloride: 109 mEq/L (ref 98–109)
Creatinine: 1 mg/dL (ref 0.6–1.1)
EGFR: 55 mL/min/{1.73_m2} — AB (ref >90)
Glucose: 126 mg/dl (ref 70–140)
POTASSIUM: 3.9 meq/L (ref 3.5–5.1)
Sodium: 143 mEq/L (ref 136–145)
Total Bilirubin: 0.63 mg/dL (ref 0.20–1.20)
Total Protein: 6.6 g/dL (ref 6.4–8.3)

## 2015-03-01 LAB — CBC WITH DIFFERENTIAL/PLATELET
BASO%: 0.2 % (ref 0.0–2.0)
BASOS ABS: 0 10*3/uL (ref 0.0–0.1)
EOS ABS: 0 10*3/uL (ref 0.0–0.5)
EOS%: 0.7 % (ref 0.0–7.0)
HEMATOCRIT: 32 % — AB (ref 34.8–46.6)
HEMOGLOBIN: 11.1 g/dL — AB (ref 11.6–15.9)
LYMPH#: 0.7 10*3/uL — AB (ref 0.9–3.3)
LYMPH%: 11.5 % — ABNORMAL LOW (ref 14.0–49.7)
MCH: 29.8 pg (ref 25.1–34.0)
MCHC: 34.7 g/dL (ref 31.5–36.0)
MCV: 85.8 fL (ref 79.5–101.0)
MONO#: 0.6 10*3/uL (ref 0.1–0.9)
MONO%: 10.4 % (ref 0.0–14.0)
NEUT#: 4.4 10*3/uL (ref 1.5–6.5)
NEUT%: 77.2 % — ABNORMAL HIGH (ref 38.4–76.8)
PLATELETS: 51 10*3/uL — AB (ref 145–400)
RBC: 3.73 10*6/uL (ref 3.70–5.45)
RDW: 14.2 % (ref 11.2–14.5)
WBC: 5.8 10*3/uL (ref 3.9–10.3)

## 2015-03-01 MED ORDER — HEPARIN SOD (PORK) LOCK FLUSH 100 UNIT/ML IV SOLN
500.0000 [IU] | Freq: Once | INTRAVENOUS | Status: AC
  Administered 2015-03-01: 500 [IU] via INTRAVENOUS
  Filled 2015-03-01: qty 5

## 2015-03-01 MED ORDER — SODIUM CHLORIDE 0.9 % IJ SOLN
10.0000 mL | INTRAMUSCULAR | Status: DC | PRN
  Administered 2015-03-01: 10 mL via INTRAVENOUS
  Filled 2015-03-01: qty 10

## 2015-03-01 NOTE — Patient Instructions (Signed)

## 2015-03-01 NOTE — Telephone Encounter (Signed)
Dr. Burr Medico reviewed CBC results today.  Per Kennyth Lose, [place] -  Pt's lab results are automatically faxed to Janalyn Rouse, RN at [facility].

## 2015-03-05 ENCOUNTER — Other Ambulatory Visit (HOSPITAL_BASED_OUTPATIENT_CLINIC_OR_DEPARTMENT_OTHER): Payer: Medicare Other

## 2015-03-05 ENCOUNTER — Ambulatory Visit (HOSPITAL_BASED_OUTPATIENT_CLINIC_OR_DEPARTMENT_OTHER): Payer: Medicare Other

## 2015-03-05 DIAGNOSIS — C9201 Acute myeloblastic leukemia, in remission: Secondary | ICD-10-CM

## 2015-03-05 DIAGNOSIS — C9200 Acute myeloblastic leukemia, not having achieved remission: Secondary | ICD-10-CM

## 2015-03-05 DIAGNOSIS — Z95828 Presence of other vascular implants and grafts: Secondary | ICD-10-CM

## 2015-03-05 LAB — COMPREHENSIVE METABOLIC PANEL (CC13)
ALT: 28 U/L (ref 0–55)
ANION GAP: 8 meq/L (ref 3–11)
AST: 17 U/L (ref 5–34)
Albumin: 3.3 g/dL — ABNORMAL LOW (ref 3.5–5.0)
Alkaline Phosphatase: 96 U/L (ref 40–150)
BUN: 20.5 mg/dL (ref 7.0–26.0)
CALCIUM: 9 mg/dL (ref 8.4–10.4)
CHLORIDE: 113 meq/L — AB (ref 98–109)
CO2: 23 mEq/L (ref 22–29)
Creatinine: 1 mg/dL (ref 0.6–1.1)
EGFR: 55 mL/min/{1.73_m2} — ABNORMAL LOW (ref >90)
Glucose: 134 mg/dl (ref 70–140)
POTASSIUM: 3.9 meq/L (ref 3.5–5.1)
Sodium: 143 mEq/L (ref 136–145)
Total Bilirubin: 0.48 mg/dL (ref 0.20–1.20)
Total Protein: 6.5 g/dL (ref 6.4–8.3)

## 2015-03-05 LAB — CBC WITH DIFFERENTIAL/PLATELET
BASO%: 0.2 % (ref 0.0–2.0)
BASOS ABS: 0 10*3/uL (ref 0.0–0.1)
EOS%: 1.2 % (ref 0.0–7.0)
Eosinophils Absolute: 0.1 10*3/uL (ref 0.0–0.5)
HEMATOCRIT: 31 % — AB (ref 34.8–46.6)
HGB: 10.6 g/dL — ABNORMAL LOW (ref 11.6–15.9)
LYMPH#: 0.7 10*3/uL — AB (ref 0.9–3.3)
LYMPH%: 11.6 % — ABNORMAL LOW (ref 14.0–49.7)
MCH: 29.4 pg (ref 25.1–34.0)
MCHC: 34.2 g/dL (ref 31.5–36.0)
MCV: 86.1 fL (ref 79.5–101.0)
MONO#: 0.5 10*3/uL (ref 0.1–0.9)
MONO%: 8.7 % (ref 0.0–14.0)
NEUT#: 4.7 10*3/uL (ref 1.5–6.5)
NEUT%: 78.3 % — AB (ref 38.4–76.8)
Platelets: 64 10*3/uL — ABNORMAL LOW (ref 145–400)
RBC: 3.6 10*6/uL — ABNORMAL LOW (ref 3.70–5.45)
RDW: 14.4 % (ref 11.2–14.5)
WBC: 6 10*3/uL (ref 3.9–10.3)

## 2015-03-05 MED ORDER — HEPARIN SOD (PORK) LOCK FLUSH 100 UNIT/ML IV SOLN
500.0000 [IU] | Freq: Once | INTRAVENOUS | Status: DC
  Filled 2015-03-05: qty 5

## 2015-03-05 MED ORDER — SODIUM CHLORIDE 0.9 % IJ SOLN
10.0000 mL | INTRAMUSCULAR | Status: DC | PRN
  Administered 2015-03-05: 10 mL via INTRAVENOUS
  Filled 2015-03-05: qty 10

## 2015-03-05 MED ORDER — HEPARIN SOD (PORK) LOCK FLUSH 100 UNIT/ML IV SOLN
500.0000 [IU] | Freq: Once | INTRAVENOUS | Status: AC
  Administered 2015-03-05: 500 [IU] via INTRAVENOUS
  Filled 2015-03-05: qty 5

## 2015-03-06 ENCOUNTER — Telehealth: Payer: Self-pay | Admitting: *Deleted

## 2015-03-06 NOTE — Telephone Encounter (Signed)
Called pt on cell phone and left message on voice mail re:  Dr. Burr Medico reviewed lab results done 03/05/15.  Results were faxed to [facility] as per order.  Informed pt also that platelet count increased to 64.

## 2015-03-07 DIAGNOSIS — Z87891 Personal history of nicotine dependence: Secondary | ICD-10-CM | POA: Diagnosis not present

## 2015-03-07 DIAGNOSIS — Z5111 Encounter for antineoplastic chemotherapy: Secondary | ICD-10-CM | POA: Diagnosis not present

## 2015-03-07 DIAGNOSIS — D696 Thrombocytopenia, unspecified: Secondary | ICD-10-CM | POA: Diagnosis not present

## 2015-03-07 DIAGNOSIS — C9200 Acute myeloblastic leukemia, not having achieved remission: Secondary | ICD-10-CM | POA: Diagnosis not present

## 2015-03-07 DIAGNOSIS — K219 Gastro-esophageal reflux disease without esophagitis: Secondary | ICD-10-CM | POA: Diagnosis not present

## 2015-03-07 DIAGNOSIS — D6481 Anemia due to antineoplastic chemotherapy: Secondary | ICD-10-CM | POA: Diagnosis not present

## 2015-03-07 DIAGNOSIS — T451X5A Adverse effect of antineoplastic and immunosuppressive drugs, initial encounter: Secondary | ICD-10-CM | POA: Diagnosis present

## 2015-03-07 DIAGNOSIS — K5900 Constipation, unspecified: Secondary | ICD-10-CM | POA: Diagnosis present

## 2015-03-07 DIAGNOSIS — E785 Hyperlipidemia, unspecified: Secondary | ICD-10-CM | POA: Diagnosis present

## 2015-03-07 DIAGNOSIS — C9201 Acute myeloblastic leukemia, in remission: Secondary | ICD-10-CM | POA: Diagnosis not present

## 2015-03-07 DIAGNOSIS — I10 Essential (primary) hypertension: Secondary | ICD-10-CM | POA: Diagnosis not present

## 2015-03-07 DIAGNOSIS — E039 Hypothyroidism, unspecified: Secondary | ICD-10-CM | POA: Diagnosis not present

## 2015-03-07 DIAGNOSIS — D6959 Other secondary thrombocytopenia: Secondary | ICD-10-CM | POA: Diagnosis present

## 2015-03-08 ENCOUNTER — Other Ambulatory Visit: Payer: Self-pay | Admitting: Hematology

## 2015-03-08 DIAGNOSIS — C9200 Acute myeloblastic leukemia, not having achieved remission: Secondary | ICD-10-CM

## 2015-03-09 ENCOUNTER — Telehealth: Payer: Self-pay | Admitting: Hematology

## 2015-03-09 ENCOUNTER — Other Ambulatory Visit: Payer: Self-pay | Admitting: *Deleted

## 2015-03-09 NOTE — Telephone Encounter (Signed)
per pof to sch pt appt-cld & spoke to Jerry(spouse)gave pt spouse appt times & dates

## 2015-03-13 ENCOUNTER — Ambulatory Visit (HOSPITAL_BASED_OUTPATIENT_CLINIC_OR_DEPARTMENT_OTHER): Payer: Medicare Other

## 2015-03-13 ENCOUNTER — Ambulatory Visit: Payer: Medicare Other

## 2015-03-13 ENCOUNTER — Other Ambulatory Visit (HOSPITAL_BASED_OUTPATIENT_CLINIC_OR_DEPARTMENT_OTHER): Payer: Medicare Other

## 2015-03-13 VITALS — BP 133/60 | HR 76 | Temp 98.2°F | Resp 16

## 2015-03-13 DIAGNOSIS — C9200 Acute myeloblastic leukemia, not having achieved remission: Secondary | ICD-10-CM

## 2015-03-13 DIAGNOSIS — Z5189 Encounter for other specified aftercare: Secondary | ICD-10-CM

## 2015-03-13 DIAGNOSIS — C9201 Acute myeloblastic leukemia, in remission: Secondary | ICD-10-CM | POA: Diagnosis not present

## 2015-03-13 DIAGNOSIS — E876 Hypokalemia: Secondary | ICD-10-CM

## 2015-03-13 DIAGNOSIS — D649 Anemia, unspecified: Secondary | ICD-10-CM

## 2015-03-13 DIAGNOSIS — Z95828 Presence of other vascular implants and grafts: Secondary | ICD-10-CM

## 2015-03-13 DIAGNOSIS — Z452 Encounter for adjustment and management of vascular access device: Secondary | ICD-10-CM

## 2015-03-13 LAB — CBC WITH DIFFERENTIAL/PLATELET
BASO%: 0.3 % (ref 0.0–2.0)
BASOS ABS: 0 10*3/uL (ref 0.0–0.1)
EOS%: 0.5 % (ref 0.0–7.0)
Eosinophils Absolute: 0 10*3/uL (ref 0.0–0.5)
HEMATOCRIT: 33.1 % — AB (ref 34.8–46.6)
HEMOGLOBIN: 11.4 g/dL — AB (ref 11.6–15.9)
LYMPH#: 0.3 10*3/uL — AB (ref 0.9–3.3)
LYMPH%: 8.9 % — ABNORMAL LOW (ref 14.0–49.7)
MCH: 29.6 pg (ref 25.1–34.0)
MCHC: 34.3 g/dL (ref 31.5–36.0)
MCV: 86.3 fL (ref 79.5–101.0)
MONO#: 0.1 10*3/uL (ref 0.1–0.9)
MONO%: 1.8 % (ref 0.0–14.0)
NEUT#: 3.1 10*3/uL (ref 1.5–6.5)
NEUT%: 88.5 % — ABNORMAL HIGH (ref 38.4–76.8)
Platelets: 42 10*3/uL — ABNORMAL LOW (ref 145–400)
RBC: 3.84 10*6/uL (ref 3.70–5.45)
RDW: 14.5 % (ref 11.2–14.5)
WBC: 3.4 10*3/uL — ABNORMAL LOW (ref 3.9–10.3)

## 2015-03-13 LAB — COMPREHENSIVE METABOLIC PANEL (CC13)
ALBUMIN: 3.1 g/dL — AB (ref 3.5–5.0)
ALK PHOS: 92 U/L (ref 40–150)
ALT: 18 U/L (ref 0–55)
AST: 16 U/L (ref 5–34)
Anion Gap: 8 mEq/L (ref 3–11)
BUN: 24.6 mg/dL (ref 7.0–26.0)
CALCIUM: 9.2 mg/dL (ref 8.4–10.4)
CO2: 26 mEq/L (ref 22–29)
CREATININE: 1.4 mg/dL — AB (ref 0.6–1.1)
Chloride: 110 mEq/L — ABNORMAL HIGH (ref 98–109)
EGFR: 37 mL/min/{1.73_m2} — ABNORMAL LOW (ref >90)
Glucose: 116 mg/dl (ref 70–140)
POTASSIUM: 4.3 meq/L (ref 3.5–5.1)
Sodium: 143 mEq/L (ref 136–145)
Total Bilirubin: 0.66 mg/dL (ref 0.20–1.20)
Total Protein: 6.7 g/dL (ref 6.4–8.3)

## 2015-03-13 LAB — MAGNESIUM (CC13): MAGNESIUM: 2 mg/dL (ref 1.5–2.5)

## 2015-03-13 MED ORDER — SODIUM CHLORIDE 0.9 % IJ SOLN
10.0000 mL | INTRAMUSCULAR | Status: DC | PRN
  Administered 2015-03-13: 10 mL via INTRAVENOUS
  Filled 2015-03-13: qty 10

## 2015-03-13 MED ORDER — HEPARIN SOD (PORK) LOCK FLUSH 100 UNIT/ML IV SOLN
500.0000 [IU] | Freq: Once | INTRAVENOUS | Status: AC
  Administered 2015-03-13: 500 [IU] via INTRAVENOUS
  Filled 2015-03-13: qty 5

## 2015-03-13 MED ORDER — PEGFILGRASTIM INJECTION 6 MG/0.6ML
6.0000 mg | Freq: Once | SUBCUTANEOUS | Status: AC
  Administered 2015-03-13: 6 mg via SUBCUTANEOUS
  Filled 2015-03-13: qty 0.6

## 2015-03-13 NOTE — Patient Instructions (Addendum)
Implanted Port Home Guide An implanted port is a type of central line that is placed under the skin. Central lines are used to provide IV access when treatment or nutrition needs to be given through a person's veins. Implanted ports are used for long-term IV access. An implanted port may be placed because:   You need IV medicine that would be irritating to the small veins in your hands or arms.   You need long-term IV medicines, such as antibiotics.   You need IV nutrition for a long period.   You need frequent blood draws for lab tests.   You need dialysis.  Implanted ports are usually placed in the chest area, but they can also be placed in the upper arm, the abdomen, or the leg. An implanted port has two main parts:   Reservoir. The reservoir is round and will appear as a small, raised area under your skin. The reservoir is the part where a needle is inserted to give medicines or draw blood.   Catheter. The catheter is a thin, flexible tube that extends from the reservoir. The catheter is placed into a large vein. Medicine that is inserted into the reservoir goes into the catheter and then into the vein.  HOW WILL I CARE FOR MY INCISION SITE? Do not get the incision site wet. Bathe or shower as directed by your health care provider.  HOW IS MY PORT ACCESSED? Special steps must be taken to access the port:   Before the port is accessed, a numbing cream can be placed on the skin. This helps numb the skin over the port site.   Your health care provider uses a sterile technique to access the port.  Your health care provider must put on a mask and sterile gloves.  The skin over your port is cleaned carefully with an antiseptic and allowed to dry.  The port is gently pinched between sterile gloves, and a needle is inserted into the port.  Only "non-coring" port needles should be used to access the port. Once the port is accessed, a blood return should be checked. This helps  ensure that the port is in the vein and is not clogged.   If your port needs to remain accessed for a constant infusion, a clear (transparent) bandage will be placed over the needle site. The bandage and needle will need to be changed every week, or as directed by your health care provider.   Keep the bandage covering the needle clean and dry. Do not get it wet. Follow your health care provider's instructions on how to take a shower or bath while the port is accessed.   If your port does not need to stay accessed, no bandage is needed over the port.  WHAT IS FLUSHING? Flushing helps keep the port from getting clogged. Follow your health care provider's instructions on how and when to flush the port. Ports are usually flushed with saline solution or a medicine called heparin. The need for flushing will depend on how the port is used.   If the port is used for intermittent medicines or blood draws, the port will need to be flushed:   After medicines have been given.   After blood has been drawn.   As part of routine maintenance.   If a constant infusion is running, the port may not need to be flushed.  HOW LONG WILL MY PORT STAY IMPLANTED? The port can stay in for as long as your health care   provider thinks it is needed. When it is time for the port to come out, surgery will be done to remove it. The procedure is similar to the one performed when the port was put in.  WHEN SHOULD I SEEK IMMEDIATE MEDICAL CARE? When you have an implanted port, you should seek immediate medical care if:   You notice a bad smell coming from the incision site.   You have swelling, redness, or drainage at the incision site.   You have more swelling or pain at the port site or the surrounding area.   You have a fever that is not controlled with medicine. Document Released: 06/23/2005 Document Revised: 04/13/2013 Document Reviewed: 02/28/2013 ExitCare Patient Information 2015 [employer]. This  information is not intended to replace advice given to you by your health care provider. Make sure you discuss any questions you have with your health care provider. Pegfilgrastim injection What is this medicine? PEGFILGRASTIM (peg fil GRA stim) is a long-acting granulocyte colony-stimulating factor that stimulates the growth of neutrophils, a type of white blood cell important in the body's fight against infection. It is used to reduce the incidence of fever and infection in patients with certain types of cancer who are receiving chemotherapy that affects the bone marrow. This medicine may be used for other purposes; ask your health care provider or pharmacist if you have questions. COMMON BRAND NAME(S): Neulasta What should I tell my health care provider before I take this medicine? They need to know if you have any of these conditions: -latex allergy -ongoing radiation therapy -sickle cell disease -skin reactions to acrylic adhesives (On-Body Injector only) -an unusual or allergic reaction to pegfilgrastim, filgrastim, other medicines, foods, dyes, or preservatives -pregnant or trying to get pregnant -breast-feeding How should I use this medicine? This medicine is for injection under the skin. If you get this medicine at home, you will be taught how to prepare and give the pre-filled syringe or how to use the On-body Injector. Refer to the patient Instructions for Use for detailed instructions. Use exactly as directed. Take your medicine at regular intervals. Do not take your medicine more often than directed. It is important that you put your used needles and syringes in a special sharps container. Do not put them in a trash can. If you do not have a sharps container, call your pharmacist or healthcare provider to get one. Talk to your pediatrician regarding the use of this medicine in children. Special care may be needed. Overdosage: If you think you have taken too much of this medicine  contact a poison control center or emergency room at once. NOTE: This medicine is only for you. Do not share this medicine with others. What if I miss a dose? It is important not to miss your dose. Call your doctor or health care professional if you miss your dose. If you miss a dose due to an On-body Injector failure or leakage, a new dose should be administered as soon as possible using a single prefilled syringe for manual use. What may interact with this medicine? Interactions have not been studied. Give your health care provider a list of all the medicines, herbs, non-prescription drugs, or dietary supplements you use. Also tell them if you smoke, drink alcohol, or use illegal drugs. Some items may interact with your medicine. This list may not describe all possible interactions. Give your health care provider a list of all the medicines, herbs, non-prescription drugs, or dietary supplements you use. Also tell   them if you smoke, drink alcohol, or use illegal drugs. Some items may interact with your medicine. What should I watch for while using this medicine? You may need blood work done while you are taking this medicine. If you are going to need a MRI, CT scan, or other procedure, tell your doctor that you are using this medicine (On-Body Injector only). What side effects may I notice from receiving this medicine? Side effects that you should report to your doctor or health care professional as soon as possible: -allergic reactions like skin rash, itching or hives, swelling of the face, lips, or tongue -dizziness -fever -pain, redness, or irritation at site where injected -pinpoint red spots on the skin -shortness of breath or breathing problems -stomach or side pain, or pain at the shoulder -swelling -tiredness -trouble passing urine Side effects that usually do not require medical attention (report to your doctor or health care professional if they continue or are bothersome): -bone  pain -muscle pain This list may not describe all possible side effects. Call your doctor for medical advice about side effects. You may report side effects to FDA at 1-800-FDA-1088. Where should I keep my medicine? Keep out of the reach of children. Store pre-filled syringes in a refrigerator between 2 and 8 degrees C (36 and 46 degrees F). Do not freeze. Keep in carton to protect from light. Throw away this medicine if it is left out of the refrigerator for more than 48 hours. Throw away any unused medicine after the expiration date. NOTE: This sheet is a summary. It may not cover all possible information. If you have questions about this medicine, talk to your doctor, pharmacist, or health care provider.  2015, Elsevier/Gold Standard. (2013-09-22 16:14:05)  

## 2015-03-13 NOTE — Progress Notes (Signed)
Neulasta injection given by flush nurse 

## 2015-03-15 ENCOUNTER — Ambulatory Visit (HOSPITAL_COMMUNITY)
Admission: RE | Admit: 2015-03-15 | Discharge: 2015-03-15 | Disposition: A | Discharge status: Home / Self Care | Payer: Medicare Other | Source: Ambulatory Visit | Attending: Hematology | Admitting: Hematology

## 2015-03-15 ENCOUNTER — Other Ambulatory Visit: Payer: Self-pay | Admitting: *Deleted

## 2015-03-15 ENCOUNTER — Other Ambulatory Visit (HOSPITAL_BASED_OUTPATIENT_CLINIC_OR_DEPARTMENT_OTHER): Payer: Medicare Other

## 2015-03-15 ENCOUNTER — Telehealth: Payer: Self-pay | Admitting: *Deleted

## 2015-03-15 ENCOUNTER — Ambulatory Visit: Payer: Medicare Other

## 2015-03-15 VITALS — BP 129/56 | HR 82 | Temp 97.9°F | Resp 18

## 2015-03-15 DIAGNOSIS — E876 Hypokalemia: Secondary | ICD-10-CM

## 2015-03-15 DIAGNOSIS — C9201 Acute myeloblastic leukemia, in remission: Secondary | ICD-10-CM

## 2015-03-15 DIAGNOSIS — C9200 Acute myeloblastic leukemia, not having achieved remission: Secondary | ICD-10-CM | POA: Diagnosis not present

## 2015-03-15 DIAGNOSIS — D649 Anemia, unspecified: Secondary | ICD-10-CM

## 2015-03-15 LAB — CBC WITH DIFFERENTIAL/PLATELET
BASO%: 0.2 % (ref 0.0–2.0)
Basophils Absolute: 0 10*3/uL (ref 0.0–0.1)
EOS%: 0.2 % (ref 0.0–7.0)
Eosinophils Absolute: 0 10*3/uL (ref 0.0–0.5)
HCT: 32.2 % — ABNORMAL LOW (ref 34.8–46.6)
HGB: 11.3 g/dL — ABNORMAL LOW (ref 11.6–15.9)
LYMPH%: 7.9 % — ABNORMAL LOW (ref 14.0–49.7)
MCH: 30.1 pg (ref 25.1–34.0)
MCHC: 35.1 g/dL (ref 31.5–36.0)
MCV: 85.9 fL (ref 79.5–101.0)
MONO#: 0.1 10*3/uL (ref 0.1–0.9)
MONO%: 2.1 % (ref 0.0–14.0)
NEUT#: 4.2 10*3/uL (ref 1.5–6.5)
NEUT%: 89.6 % — ABNORMAL HIGH (ref 38.4–76.8)
Platelets: 17 10*3/uL — ABNORMAL LOW (ref 145–400)
RBC: 3.75 10*6/uL (ref 3.70–5.45)
RDW: 14.3 % (ref 11.2–14.5)
WBC: 4.7 10*3/uL (ref 3.9–10.3)
lymph#: 0.4 10*3/uL — ABNORMAL LOW (ref 0.9–3.3)
nRBC: 0 % (ref 0–0)

## 2015-03-15 MED ORDER — HEPARIN SOD (PORK) LOCK FLUSH 100 UNIT/ML IV SOLN
500.0000 [IU] | Freq: Once | INTRAVENOUS | Status: DC
  Filled 2015-03-15: qty 5

## 2015-03-15 MED ORDER — SODIUM CHLORIDE 0.9 % IJ SOLN
10.0000 mL | INTRAMUSCULAR | Status: DC | PRN
  Filled 2015-03-15: qty 10

## 2015-03-15 MED ORDER — SODIUM CHLORIDE 0.9 % IV SOLN: 250.0000 mL | Freq: Once | INTRAVENOUS | Status: DC

## 2015-03-15 MED ORDER — HEPARIN SOD (PORK) LOCK FLUSH 100 UNIT/ML IV SOLN
500.0000 [IU] | Freq: Every day | INTRAVENOUS | Status: AC | PRN
  Administered 2015-03-15: 500 [IU]
  Filled 2015-03-15: qty 5

## 2015-03-15 MED ORDER — SODIUM CHLORIDE 0.9 % IJ SOLN
10.0000 mL | INTRAMUSCULAR | Status: AC | PRN
  Administered 2015-03-15: 10 mL

## 2015-03-15 NOTE — Telephone Encounter (Signed)
Called for HAR with admitting/WL & set up platelet transfusion for today at [facility].  Pt notified & [facility].  Blood Bank notified.  Pt feels good & will return Monday for labs & possible transfusion.

## 2015-03-15 NOTE — Procedures (Signed)
[facility]  Procedure Note  Miranda Mcguire WKG:[id number] DOB: 16-May-1946 DOA: 03/15/2015   Dr. Burr Medico  Associated Diagnosis: C92.00  Procedure Note: Port accessed, platelets infused per order, port flushed and de-accessed per protcol   Condition During Procedure: patient stable   Condition at Discharge: patient denies any discomforts, ambulatory to lobby to wait for husband.   [name], RN  [facility]

## 2015-03-15 NOTE — Patient Instructions (Signed)

## 2015-03-16 ENCOUNTER — Telehealth: Payer: Self-pay | Admitting: *Deleted

## 2015-03-16 ENCOUNTER — Other Ambulatory Visit: Payer: Self-pay | Admitting: *Deleted

## 2015-03-16 DIAGNOSIS — C9200 Acute myeloblastic leukemia, not having achieved remission: Secondary | ICD-10-CM

## 2015-03-16 NOTE — Telephone Encounter (Signed)
Pt is scheduled at  0930 am at [facility] for platelet and possible blood transfusion on  Mon  03/19/15.

## 2015-03-19 ENCOUNTER — Ambulatory Visit (HOSPITAL_COMMUNITY)
Admission: RE | Admit: 2015-03-19 | Discharge: 2015-03-19 | Disposition: A | Discharge status: Home / Self Care | Payer: Medicare Other | Source: Ambulatory Visit | Attending: Hematology | Admitting: Hematology

## 2015-03-19 ENCOUNTER — Other Ambulatory Visit (HOSPITAL_BASED_OUTPATIENT_CLINIC_OR_DEPARTMENT_OTHER): Payer: Medicare Other

## 2015-03-19 ENCOUNTER — Telehealth: Payer: Self-pay | Admitting: *Deleted

## 2015-03-19 ENCOUNTER — Ambulatory Visit (HOSPITAL_BASED_OUTPATIENT_CLINIC_OR_DEPARTMENT_OTHER): Payer: Medicare Other | Admitting: Hematology

## 2015-03-19 ENCOUNTER — Ambulatory Visit (HOSPITAL_BASED_OUTPATIENT_CLINIC_OR_DEPARTMENT_OTHER): Payer: Medicare Other

## 2015-03-19 ENCOUNTER — Other Ambulatory Visit: Payer: Self-pay | Admitting: *Deleted

## 2015-03-19 ENCOUNTER — Encounter: Payer: Self-pay | Admitting: Hematology

## 2015-03-19 VITALS — BP 119/65 | HR 88 | Temp 97.8°F | Resp 18

## 2015-03-19 VITALS — BP 141/49 | HR 83 | Temp 98.3°F | Resp 18

## 2015-03-19 DIAGNOSIS — N179 Acute kidney failure, unspecified: Secondary | ICD-10-CM

## 2015-03-19 DIAGNOSIS — C9201 Acute myeloblastic leukemia, in remission: Secondary | ICD-10-CM

## 2015-03-19 DIAGNOSIS — Z452 Encounter for adjustment and management of vascular access device: Secondary | ICD-10-CM | POA: Diagnosis present

## 2015-03-19 DIAGNOSIS — C9200 Acute myeloblastic leukemia, not having achieved remission: Secondary | ICD-10-CM

## 2015-03-19 DIAGNOSIS — Z95828 Presence of other vascular implants and grafts: Secondary | ICD-10-CM

## 2015-03-19 LAB — CBC WITH DIFFERENTIAL/PLATELET
BASO%: 0 % (ref 0.0–2.0)
BASOS ABS: 0 10*3/uL (ref 0.0–0.1)
EOS ABS: 0 10*3/uL (ref 0.0–0.5)
EOS%: 0 % (ref 0.0–7.0)
HEMATOCRIT: 27.6 % — AB (ref 34.8–46.6)
HGB: 9.7 g/dL — ABNORMAL LOW (ref 11.6–15.9)
LYMPH%: 88.5 % — AB (ref 14.0–49.7)
MCH: 29.9 pg (ref 25.1–34.0)
MCHC: 35.1 g/dL (ref 31.5–36.0)
MCV: 85.2 fL (ref 79.5–101.0)
MONO#: 0 10*3/uL — AB (ref 0.1–0.9)
MONO%: 3.8 % (ref 0.0–14.0)
NEUT%: 7.7 % — ABNORMAL LOW (ref 38.4–76.8)
NEUTROS ABS: 0 10*3/uL — AB (ref 1.5–6.5)
PLATELETS: 6 10*3/uL — AB (ref 145–400)
RBC: 3.24 10*6/uL — AB (ref 3.70–5.45)
RDW: 13.5 % (ref 11.2–14.5)
WBC: 0.3 10*3/uL — AB (ref 3.9–10.3)
lymph#: 0.2 10*3/uL — ABNORMAL LOW (ref 0.9–3.3)
nRBC: 0 % (ref 0–0)

## 2015-03-19 LAB — COMPREHENSIVE METABOLIC PANEL (CC13)
ALT: 19 U/L (ref 0–55)
ANION GAP: 9 meq/L (ref 3–11)
AST: 11 U/L (ref 5–34)
Albumin: 3.2 g/dL — ABNORMAL LOW (ref 3.5–5.0)
Alkaline Phosphatase: 98 U/L (ref 40–150)
BILIRUBIN TOTAL: 0.8 mg/dL (ref 0.20–1.20)
BUN: 26.3 mg/dL — ABNORMAL HIGH (ref 7.0–26.0)
CALCIUM: 9.4 mg/dL (ref 8.4–10.4)
CHLORIDE: 109 meq/L (ref 98–109)
CO2: 24 mEq/L (ref 22–29)
CREATININE: 1.2 mg/dL — AB (ref 0.6–1.1)
EGFR: 47 mL/min/{1.73_m2} — AB (ref >90)
Glucose: 135 mg/dl (ref 70–140)
Potassium: 4.1 mEq/L (ref 3.5–5.1)
Sodium: 142 mEq/L (ref 136–145)
TOTAL PROTEIN: 6.6 g/dL (ref 6.4–8.3)

## 2015-03-19 LAB — HOLD TUBE, BLOOD BANK

## 2015-03-19 LAB — MAGNESIUM (CC13): MAGNESIUM: 2.1 mg/dL (ref 1.5–2.5)

## 2015-03-19 MED ORDER — HEPARIN SOD (PORK) LOCK FLUSH 100 UNIT/ML IV SOLN
500.0000 [IU] | Freq: Every day | INTRAVENOUS | Status: AC | PRN
  Administered 2015-03-19: 500 [IU]
  Filled 2015-03-19: qty 5

## 2015-03-19 MED ORDER — SODIUM CHLORIDE 0.9 % IJ SOLN
10.0000 mL | INTRAMUSCULAR | Status: DC | PRN
  Administered 2015-03-19: 10 mL via INTRAVENOUS
  Filled 2015-03-19: qty 10

## 2015-03-19 MED ORDER — SODIUM CHLORIDE 0.9 % IJ SOLN
10.0000 mL | INTRAMUSCULAR | Status: AC | PRN
  Administered 2015-03-19: 10 mL

## 2015-03-19 MED ORDER — SODIUM CHLORIDE 0.9 % IV SOLN: 250.0000 mL | Freq: Once | INTRAVENOUS | Status: DC

## 2015-03-19 NOTE — Patient Instructions (Signed)

## 2015-03-19 NOTE — Procedures (Signed)
[facility]  Procedure Note  Jerrianne Hartin FHS:[id number] DOB: May 20, 1946 DOA: 03/19/2015   KOR:JGYL, Krista Blue, MD  Associated Diagnosis: AML (acute myeloid leukemia)   Procedure Note: Transfusion of 1 unit of platelets   Condition During Procedure:  Pt tolerated well; no complications noted   Condition at Discharge:   Nigel Sloop, [facility]

## 2015-03-19 NOTE — Telephone Encounter (Signed)
Pt is scheduled for platelet and possible blood transfusion at [place] on Thursday  03/22/15.

## 2015-03-19 NOTE — Progress Notes (Signed)
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 Myeloid panel PENDING.  11/15/2014 - Chemotherapy Induction with cytarabine and daunorubicin (7+3), nadir bmbx 11/28/14 showing residual disease  11/29/2014 - Chemotherapy Re-induction with cytarabine and daunorubicin (5+2); nadir bmbx 12/11/14 negative  12/28/2014 Remission Recovery bmbx c/w remission with incomplete platelet recovery (CRi)  01/17/2015 - Chemotherapy C1 Hidac 1gm/m2 + Neulasta  03/08/2015 -Chemotherapy C2 Hidac 1gm/m2 + Neulasta    INTERVAL HISTORY: Miranda Mcguire returns for follow-up. She received second cycle chemotherapy Hidac at [facility] about 10 days ago, tolerated well, and was discharged home one week ago. She received a Neulasta injection the day after her hospital discharge. She received 1 unit of platelets last week, and here for the repeat lab and blood transfusion can.  She denied any bleeding episodes including hematochezia, melana, hemoptysis, hematuria or epitaxis. No mucosal bleeding, She has 2 small bruises on her arms. No fever or chills, she is eating well.    REVIEW OF SYSTEMS:   Constitutional: Denies fevers, chills or abnormal weight loss, (+) fatigue  Eyes: Denies blurriness of vision Ears, nose, mouth, throat, and face: Denies mucositis or sore throat Respiratory: Denies cough, dyspnea or wheezes Cardiovascular: Denies palpitation, chest discomfort or lower extremity swelling Gastrointestinal:  Denies nausea, heartburn or change in bowel habits Skin: Denies  abnormal skin rashes Lymphatics: Denies new lymphadenopathy or easy bruising Neurological:Denies numbness, tingling or new weaknesses Behavioral/Psych: Mood is stable, no new changes  All other systems were reviewed with the patient and are negative.  MEDICAL HISTORY:  Past Medical History  Diagnosis Date  . Allergy   . Anemia     nos  . Hyperlipidemia   . Hypertension   . OA (osteoarthritis)   . OP (osteoporosis)     last dexa 08-15-08  . Endometrial polyp     post-menopausal bleeding    SURGICAL HISTORY: Past Surgical History  Procedure Laterality Date  . Tonsilectomy, adenoidectomy, bilateral myringotomy and tubes    . Herniated disc      L4-5 per Dr Saintclair Halsted  . Endometrial polypectomy      with D and C 03-08-09 pr dr Gardenia Phlegm  . Colonoscopy  02-14-08    per Dr. Ardis Hughs, repeat in 5 yrs  . Dilation and curettage of uterus      I have reviewed the social history and family history with the patient and they are unchanged from previous note.  ALLERGIES:  is allergic to penicillins and aspirin.  MEDICATIONS:  Current Outpatient Prescriptions  Medication Sig Dispense Refill  . amLODipine (NORVASC) 10 MG tablet Take 10 mg by mouth daily.    Marland Kitchen atorvastatin (LIPITOR) 20 MG tablet TAKE 1 TABLET (20 MG TOTAL) BY MOUTH DAILY. 90 tablet 2  . B Complex Vitamins (VITAMIN B COMPLEX PO) Take 1 tablet by mouth daily.     Marland Kitchen CALCIUM-VITAMIN D PO Take 1 tablet by mouth daily.     . fish oil-omega-3 fatty acids 1000 MG capsule Take 1 g by mouth daily.      . fluconazole (DIFLUCAN) 200 MG tablet Take 200 mg by mouth daily.    Marland Kitchen  furosemide (LASIX) 40 MG tablet Take 40 mg by mouth as needed.    . Garlic TABS Take 1 tablet by mouth daily.     Marland Kitchen levofloxacin (LEVAQUIN) 500 MG tablet Take 500 mg by mouth daily.    Marland Kitchen levothyroxine (SYNTHROID, LEVOTHROID) 50 MCG tablet TAKE 1 TABLET (50 MCG TOTAL) BY MOUTH DAILY. 90 tablet 2  . lidocaine-prilocaine (EMLA) cream Apply a small amount to skin over PAC  30-45 minutes prior to access.    . loratadine (CLARITIN) 10 MG tablet Take 10 mg by mouth daily.    Marland Kitchen MAGNESIUM PO Take 1 tablet by mouth daily.    . Metoprolol Tartrate 37.5 MG TABS Take 37.5 mg by mouth 2 (two) times daily.    . Multiple Vitamins-Minerals (ICAPS PO) Take 1 tablet by mouth daily. macavue    . nabumetone (RELAFEN) 750 MG tablet TAKE 1 TABLET (750 MG TOTAL) BY MOUTH 2 (TWO) TIMES DAILY. 180 tablet 2  . ondansetron (ZOFRAN) 8 MG tablet Take 8 mg by mouth every 8 (eight) hours.    . potassium chloride (K-DUR) 10 MEQ tablet Take 1 tablet (10 mEq total) by mouth daily. 7 tablet 0   No current facility-administered medications for this visit.    PHYSICAL EXAMINATION: ECOG PERFORMANCE STATUS: 2 - Symptomatic, <50% confined to bed  Filed Vitals:   03/19/15 0932  BP: 141/49  Pulse: 83  Temp: 98.3 F (36.8 C)  Resp: 18   There were no vitals filed for this visit.  GENERAL:alert, no distress and comfortable SKIN: skin color, texture, turgor are normal, no rashes or significant lesions, except a small ecchymosis and subcutaneous hematoma at the lateral of her left thigh EYES: normal, Conjunctiva are pink and non-injected, sclera clear OROPHARYNX:no exudate, no erythema and lips, buccal mucosa, and tongue normal  NECK: supple, thyroid normal size, non-tender, without nodularity LYMPH:  no palpable lymphadenopathy in the cervical, axillary or inguinal LUNGS: clear to auscultation and percussion with normal breathing effort HEART: regular rate & rhythm and no murmurs and no lower extremity edema ABDOMEN:abdomen soft, non-tender and normal bowel sounds Musculoskeletal:no cyanosis of digits and no clubbing  NEURO: alert & oriented x 3 with fluent speech, no focal motor/sensory deficits  LABORATORY DATA:  I have reviewed the data as listed CBC Latest Ref Rng 03/19/2015 03/15/2015 03/13/2015  WBC 3.9 - 10.3 10e3/uL 0.3(LL) 4.7 3.4(L)  Hemoglobin 11.6 - 15.9 g/dL 9.7(L) 11.3(L)  11.4(L)  Hematocrit 34.8 - 46.6 % 27.6(L) 32.2(L) 33.1(L)  Platelets 145 - 400 10e3/uL 6(LL) 17(L) 42(L)     CMP Latest Ref Rng 03/19/2015 03/13/2015 03/05/2015  Glucose 70 - 140 mg/dl 135 116 134  BUN 7.0 - 26.0 mg/dL 26.3(H) 24.6 20.5  Creatinine 0.6 - 1.1 mg/dL 1.2(H) 1.4(H) 1.0  Sodium 136 - 145 mEq/L 142 143 143  Potassium 3.5 - 5.1 mEq/L 4.1 4.3 3.9  Chloride 96 - 112 mEq/L - - -  CO2 22 - 29 mEq/L _0 Calcium 8.4 - 10.4 mg/dL 9.4 9.2 9.0  Total Protein 6.4 - 8.3 g/dL 6.6 6.7 6.5  Total Bilirubin 0.20 - 1.20 mg/dL 0.80 0.66 0.48  Alkaline Phos 40 - 150 U/L 98 92 96  AST 5 - 34 U/L _1 ALT 0 - 55 U/L _2 ANC 1.6K today    RADIOGRAPHIC STUDIES: I have personally reviewed the radiological images as listed and agreed with the findings in the report. No results found.  ASSESSMENT & PLAN:  69 year old Caucasian female  1. AML, in remission, normal cytogenetics -She is currently status post her second cycle of Hidac Lab reviewed with her. Her neutropenia his profound, and her platelets is 6K, no significant bleeding. -She does not need a blood transfusion today. She has received 4 units of RBC, 4 units of pheresed platelet so far.  -She will return in 3 days and next Monday to repeat lab. If her plt<10 in 3 days, I would consider plt transfusion on Saturday 9/17 also  -I probably will see her back as needed   2. Mild AKI  -Her Cr was 1.4 last week, and a 1.2 today. I encouraged her to drink more fluids   Plan -plt transfusion today -lab every Monday and Thursday    ll questions were answered. The patient knows to call the clinic with any problems, questions or concerns. No barriers to learning was detected.  I spent 10 minutes counseling the patient face to face. The total time spent in the appointment was 15 minutes and more than 50% was on counseling and review of test results     Truitt Merle, MD 03/19/2015 9:58 AM

## 2015-03-20 ENCOUNTER — Other Ambulatory Visit: Payer: Self-pay | Admitting: *Deleted

## 2015-03-20 DIAGNOSIS — C9200 Acute myeloblastic leukemia, not having achieved remission: Secondary | ICD-10-CM

## 2015-03-20 LAB — PREPARE PLATELET PHERESIS
UNIT DIVISION: 0
Unit division: 0

## 2015-03-22 ENCOUNTER — Other Ambulatory Visit: Payer: Self-pay | Admitting: *Deleted

## 2015-03-22 ENCOUNTER — Ambulatory Visit (HOSPITAL_BASED_OUTPATIENT_CLINIC_OR_DEPARTMENT_OTHER): Payer: Medicare Other

## 2015-03-22 ENCOUNTER — Ambulatory Visit: Payer: Medicare Other

## 2015-03-22 ENCOUNTER — Ambulatory Visit (HOSPITAL_COMMUNITY)
Admission: RE | Admit: 2015-03-22 | Discharge: 2015-03-22 | Disposition: A | Discharge status: Home / Self Care | Payer: Medicare Other | Source: Ambulatory Visit | Attending: Hematology | Admitting: Hematology

## 2015-03-22 ENCOUNTER — Telehealth: Payer: Self-pay | Admitting: *Deleted

## 2015-03-22 ENCOUNTER — Other Ambulatory Visit (HOSPITAL_BASED_OUTPATIENT_CLINIC_OR_DEPARTMENT_OTHER): Payer: Medicare Other

## 2015-03-22 VITALS — BP 143/53 | HR 100 | Temp 98.6°F | Resp 24

## 2015-03-22 DIAGNOSIS — Z452 Encounter for adjustment and management of vascular access device: Secondary | ICD-10-CM

## 2015-03-22 DIAGNOSIS — C9201 Acute myeloblastic leukemia, in remission: Secondary | ICD-10-CM

## 2015-03-22 DIAGNOSIS — D649 Anemia, unspecified: Secondary | ICD-10-CM | POA: Diagnosis not present

## 2015-03-22 DIAGNOSIS — C9200 Acute myeloblastic leukemia, not having achieved remission: Secondary | ICD-10-CM | POA: Diagnosis not present

## 2015-03-22 DIAGNOSIS — Z95828 Presence of other vascular implants and grafts: Secondary | ICD-10-CM

## 2015-03-22 LAB — CBC WITH DIFFERENTIAL/PLATELET
BASO%: 0 % (ref 0.0–2.0)
Basophils Absolute: 0 10*3/uL (ref 0.0–0.1)
EOS%: 2.2 % (ref 0.0–7.0)
Eosinophils Absolute: 0 10*3/uL (ref 0.0–0.5)
HEMATOCRIT: 26.1 % — AB (ref 34.8–46.6)
HGB: 9 g/dL — ABNORMAL LOW (ref 11.6–15.9)
LYMPH#: 0.3 10*3/uL — AB (ref 0.9–3.3)
LYMPH%: 95.1 % — AB (ref 14.0–49.7)
MCH: 29.7 pg (ref 25.1–34.0)
MCHC: 34.5 g/dL (ref 31.5–36.0)
MCV: 86.1 fL (ref 79.5–101.0)
MONO#: 0 10*3/uL — AB (ref 0.1–0.9)
MONO%: 1.7 % (ref 0.0–14.0)
NEUT%: 1 % — ABNORMAL LOW (ref 38.4–76.8)
NEUTROS ABS: 0 10*3/uL — AB (ref 1.5–6.5)
PLATELETS: 12 10*3/uL — AB (ref 145–400)
RBC: 3.03 10*6/uL — AB (ref 3.70–5.45)
RDW: 13.5 % (ref 11.2–14.5)
WBC: 0.3 10*3/uL — AB (ref 3.9–10.3)

## 2015-03-22 LAB — HOLD TUBE, BLOOD BANK

## 2015-03-22 LAB — PREPARE RBC (CROSSMATCH)

## 2015-03-22 MED ORDER — SODIUM CHLORIDE 0.9 % IJ SOLN
10.0000 mL | INTRAMUSCULAR | Status: DC | PRN
  Administered 2015-03-22: 10 mL via INTRAVENOUS
  Filled 2015-03-22: qty 10

## 2015-03-22 MED ORDER — HEPARIN SOD (PORK) LOCK FLUSH 100 UNIT/ML IV SOLN
500.0000 [IU] | Freq: Every day | INTRAVENOUS | Status: DC | PRN
  Filled 2015-03-22: qty 5

## 2015-03-22 MED ORDER — SODIUM CHLORIDE 0.9 % IJ SOLN
10.0000 mL | INTRAMUSCULAR | Status: AC | PRN
  Administered 2015-03-22: 10 mL

## 2015-03-22 MED ORDER — SODIUM CHLORIDE 0.9 % IV SOLN: 250.0000 mL | Freq: Once | INTRAVENOUS | Status: DC

## 2015-03-22 MED ORDER — SODIUM CHLORIDE 0.9 % IV SOLN
250.0000 mL | Freq: Once | INTRAVENOUS | Status: AC
  Administered 2015-03-22: 250 mL via INTRAVENOUS

## 2015-03-22 MED ORDER — HEPARIN SOD (PORK) LOCK FLUSH 100 UNIT/ML IV SOLN
500.0000 [IU] | Freq: Every day | INTRAVENOUS | Status: AC | PRN
  Administered 2015-03-22: 500 [IU]

## 2015-03-22 MED ORDER — SODIUM CHLORIDE 0.9 % IJ SOLN: 10.0000 mL | INTRAMUSCULAR | Status: DC | PRN

## 2015-03-22 NOTE — Telephone Encounter (Signed)
Dr. Burr Medico reviewed lab results today.  Spoke with pt and informed pt re:  Per Dr. Burr Medico, pt to receive 1 unit of blood and 1 unit of platetlet pheresis - both irradiated today.  Pt does not need to come in on Saturday.   Pt already has lab appts on Mon and Thursday.   Pt voiced understanding.

## 2015-03-22 NOTE — Progress Notes (Signed)
Miranda Mcguire RFF:[id number] DOB: 10/01/1945 DOA: 03/22/2015   JTT:SVXB, Krista Blue, MD  Associated Diagnosis: AML (acute myeloid leukemia)   Procedure Note: Transfusion of 1 unit of platelets and 1 unit of blood via porta cath  Condition During Procedure: Pt tolerated well; no complications noted   Condition at Discharge: Pt alert, oriented and ambulatory

## 2015-03-22 NOTE — Patient Instructions (Signed)

## 2015-03-22 NOTE — Telephone Encounter (Signed)
Pt is scheduled for possible blood and platelet transfusion at [facility] on Mon 03/26/15 at 10 am.

## 2015-03-22 NOTE — Progress Notes (Signed)
Received call from Windy Carina in radiation/oncology and she indicates patient will also need 1 unit of PRBC's in addition to the platelets.  I instructed her to place orders sign/held.  Caller verbalized understanding.

## 2015-03-23 LAB — PREPARE PLATELET PHERESIS: UNIT DIVISION: 0

## 2015-03-23 LAB — TYPE AND SCREEN
ABO/RH(D): A POS
Antibody Screen: NEGATIVE
UNIT DIVISION: 0

## 2015-03-24 ENCOUNTER — Ambulatory Visit: Payer: Medicare Other

## 2015-03-26 ENCOUNTER — Other Ambulatory Visit: Payer: Self-pay | Admitting: *Deleted

## 2015-03-26 ENCOUNTER — Ambulatory Visit (HOSPITAL_COMMUNITY)
Admission: RE | Admit: 2015-03-26 | Discharge: 2015-03-26 | Disposition: A | Discharge status: Home / Self Care | Payer: Medicare Other | Source: Ambulatory Visit | Attending: Hematology | Admitting: Hematology

## 2015-03-26 ENCOUNTER — Other Ambulatory Visit (HOSPITAL_BASED_OUTPATIENT_CLINIC_OR_DEPARTMENT_OTHER): Payer: Medicare Other

## 2015-03-26 ENCOUNTER — Telehealth: Payer: Self-pay | Admitting: *Deleted

## 2015-03-26 ENCOUNTER — Ambulatory Visit (HOSPITAL_BASED_OUTPATIENT_CLINIC_OR_DEPARTMENT_OTHER): Payer: Medicare Other

## 2015-03-26 VITALS — BP 126/59 | HR 84 | Temp 97.8°F | Resp 20

## 2015-03-26 DIAGNOSIS — C9200 Acute myeloblastic leukemia, not having achieved remission: Secondary | ICD-10-CM | POA: Diagnosis not present

## 2015-03-26 DIAGNOSIS — Z95828 Presence of other vascular implants and grafts: Secondary | ICD-10-CM

## 2015-03-26 DIAGNOSIS — C9201 Acute myeloblastic leukemia, in remission: Secondary | ICD-10-CM

## 2015-03-26 DIAGNOSIS — Z452 Encounter for adjustment and management of vascular access device: Secondary | ICD-10-CM

## 2015-03-26 LAB — CBC WITH DIFFERENTIAL/PLATELET
BASO%: 0.8 % (ref 0.0–2.0)
BASOS ABS: 0 10*3/uL (ref 0.0–0.1)
EOS ABS: 0 10*3/uL (ref 0.0–0.5)
EOS%: 1.5 % (ref 0.0–7.0)
HCT: 28.6 % — ABNORMAL LOW (ref 34.8–46.6)
HGB: 9.9 g/dL — ABNORMAL LOW (ref 11.6–15.9)
LYMPH%: 45.8 % (ref 14.0–49.7)
MCH: 29.6 pg (ref 25.1–34.0)
MCHC: 34.8 g/dL (ref 31.5–36.0)
MCV: 85.2 fL (ref 79.5–101.0)
MONO#: 0.1 10*3/uL (ref 0.1–0.9)
MONO%: 10.2 % (ref 0.0–14.0)
NEUT%: 41.7 % (ref 38.4–76.8)
NEUTROS ABS: 0.4 10*3/uL — AB (ref 1.5–6.5)
PLATELETS: 15 10*3/uL — AB (ref 145–400)
RBC: 3.35 10*6/uL — AB (ref 3.70–5.45)
RDW: 13.5 % (ref 11.2–14.5)
WBC: 1.1 10*3/uL — AB (ref 3.9–10.3)
lymph#: 0.5 10*3/uL — ABNORMAL LOW (ref 0.9–3.3)

## 2015-03-26 LAB — COMPREHENSIVE METABOLIC PANEL (CC13)
ALT: 21 U/L (ref 0–55)
ANION GAP: 9 meq/L (ref 3–11)
AST: 12 U/L (ref 5–34)
Albumin: 3.2 g/dL — ABNORMAL LOW (ref 3.5–5.0)
Alkaline Phosphatase: 95 U/L (ref 40–150)
BUN: 29.4 mg/dL — ABNORMAL HIGH (ref 7.0–26.0)
CALCIUM: 9.7 mg/dL (ref 8.4–10.4)
CHLORIDE: 108 meq/L (ref 98–109)
CO2: 26 meq/L (ref 22–29)
CREATININE: 1.4 mg/dL — AB (ref 0.6–1.1)
EGFR: 39 mL/min/{1.73_m2} — AB (ref >90)
Glucose: 117 mg/dl (ref 70–140)
Potassium: 4.4 mEq/L (ref 3.5–5.1)
Sodium: 143 mEq/L (ref 136–145)
Total Bilirubin: 0.38 mg/dL (ref 0.20–1.20)
Total Protein: 6.8 g/dL (ref 6.4–8.3)

## 2015-03-26 LAB — HOLD TUBE, BLOOD BANK

## 2015-03-26 LAB — MAGNESIUM (CC13): MAGNESIUM: 1.8 mg/dL (ref 1.5–2.5)

## 2015-03-26 MED ORDER — SODIUM CHLORIDE 0.9 % IJ SOLN
10.0000 mL | INTRAMUSCULAR | Status: DC | PRN
  Administered 2015-03-26: 10 mL via INTRAVENOUS
  Filled 2015-03-26: qty 10

## 2015-03-26 MED ORDER — SODIUM CHLORIDE 0.9 % IJ SOLN
10.0000 mL | INTRAMUSCULAR | Status: AC | PRN
  Administered 2015-03-26: 10 mL

## 2015-03-26 MED ORDER — HEPARIN SOD (PORK) LOCK FLUSH 100 UNIT/ML IV SOLN
500.0000 [IU] | Freq: Every day | INTRAVENOUS | Status: AC | PRN
  Administered 2015-03-26: 500 [IU]
  Filled 2015-03-26: qty 5

## 2015-03-26 MED ORDER — SODIUM CHLORIDE 0.9 % IV SOLN: 250.0000 mL | Freq: Once | INTRAVENOUS | Status: DC

## 2015-03-26 NOTE — Progress Notes (Signed)
Miranda Mcguire KWI:[id number] DOB: 1946/05/08 DOA: 03/26/2015   MEQ:ASTM, Krista Blue, MD  Associated Diagnosis: AML (acute myeloid leukemia)   Procedure Note: Transfusion of 1 unit of platelets  via porta cath  Condition During Procedure: Pt tolerated well; no complications noted   Condition at Discharge: Pt alert, oriented and ambulatory

## 2015-03-26 NOTE — Patient Instructions (Signed)

## 2015-03-26 NOTE — Telephone Encounter (Signed)
Pt is scheduled for possible blood and platelet transfusion at [facility] on Thursday 03/29/15  At  10 am.

## 2015-03-27 LAB — PREPARE PLATELET PHERESIS: Unit division: 0

## 2015-03-29 ENCOUNTER — Telehealth: Payer: Self-pay | Admitting: *Deleted

## 2015-03-29 ENCOUNTER — Ambulatory Visit (HOSPITAL_BASED_OUTPATIENT_CLINIC_OR_DEPARTMENT_OTHER): Payer: Medicare Other

## 2015-03-29 ENCOUNTER — Other Ambulatory Visit (HOSPITAL_BASED_OUTPATIENT_CLINIC_OR_DEPARTMENT_OTHER): Payer: Medicare Other

## 2015-03-29 ENCOUNTER — Inpatient Hospital Stay (HOSPITAL_COMMUNITY): Admission: RE | Admit: 2015-03-29 | Payer: Medicare Other | Source: Ambulatory Visit

## 2015-03-29 DIAGNOSIS — C9200 Acute myeloblastic leukemia, not having achieved remission: Secondary | ICD-10-CM | POA: Diagnosis present

## 2015-03-29 DIAGNOSIS — Z95828 Presence of other vascular implants and grafts: Secondary | ICD-10-CM

## 2015-03-29 LAB — CBC WITH DIFFERENTIAL/PLATELET
BASO%: 0.3 % (ref 0.0–2.0)
BASOS ABS: 0 10*3/uL (ref 0.0–0.1)
EOS ABS: 0 10*3/uL (ref 0.0–0.5)
EOS%: 0.4 % (ref 0.0–7.0)
HCT: 28.2 % — ABNORMAL LOW (ref 34.8–46.6)
HEMOGLOBIN: 9.8 g/dL — AB (ref 11.6–15.9)
LYMPH%: 24.3 % (ref 14.0–49.7)
MCH: 29.5 pg (ref 25.1–34.0)
MCHC: 34.7 g/dL (ref 31.5–36.0)
MCV: 84.9 fL (ref 79.5–101.0)
MONO#: 0.4 10*3/uL (ref 0.1–0.9)
MONO%: 14.7 % — AB (ref 0.0–14.0)
NEUT#: 1.6 10*3/uL (ref 1.5–6.5)
NEUT%: 60.3 % (ref 38.4–76.8)
Platelets: 23 10*3/uL — ABNORMAL LOW (ref 145–400)
RBC: 3.32 10*6/uL — ABNORMAL LOW (ref 3.70–5.45)
RDW: 13.5 % (ref 11.2–14.5)
WBC: 2.7 10*3/uL — ABNORMAL LOW (ref 3.9–10.3)
lymph#: 0.6 10*3/uL — ABNORMAL LOW (ref 0.9–3.3)

## 2015-03-29 LAB — HOLD TUBE, BLOOD BANK

## 2015-03-29 MED ORDER — HEPARIN SOD (PORK) LOCK FLUSH 100 UNIT/ML IV SOLN
500.0000 [IU] | Freq: Once | INTRAVENOUS | Status: AC
  Administered 2015-03-29: 500 [IU] via INTRAVENOUS
  Filled 2015-03-29: qty 5

## 2015-03-29 MED ORDER — SODIUM CHLORIDE 0.9 % IJ SOLN
10.0000 mL | INTRAMUSCULAR | Status: DC | PRN
  Administered 2015-03-29: 10 mL via INTRAVENOUS
  Filled 2015-03-29: qty 10

## 2015-03-29 NOTE — Progress Notes (Signed)
Per Dr. Burr Medico- pt needs to be de-accessed no blood today.

## 2015-03-29 NOTE — Telephone Encounter (Signed)
Patient here for labs .  WBC improved, Hgb stable - np need for transfusion.  Appt. With sickle cell cancelled for today.  Platelets at 23K .  Patient denies any bleeding.  Thrombocytopenia precautions discussed with patient and husband.  Patient back to Flush Nurse to be de-accessed.

## 2015-03-29 NOTE — Patient Instructions (Signed)

## 2015-03-29 NOTE — Addendum Note (Signed)
Addended by: Jethro Bolus A on: 03/29/2015 09:45 AM   Modules accepted: Orders, SmartSet

## 2015-04-02 ENCOUNTER — Other Ambulatory Visit: Payer: Self-pay | Admitting: Hematology

## 2015-04-02 ENCOUNTER — Other Ambulatory Visit (HOSPITAL_BASED_OUTPATIENT_CLINIC_OR_DEPARTMENT_OTHER): Payer: Medicare Other

## 2015-04-02 ENCOUNTER — Ambulatory Visit (HOSPITAL_COMMUNITY)
Admission: RE | Admit: 2015-04-02 | Discharge: 2015-04-02 | Disposition: A | Discharge status: Home / Self Care | Payer: Medicare Other | Source: Ambulatory Visit | Attending: Hematology | Admitting: Hematology

## 2015-04-02 ENCOUNTER — Ambulatory Visit (HOSPITAL_BASED_OUTPATIENT_CLINIC_OR_DEPARTMENT_OTHER): Payer: Medicare Other

## 2015-04-02 VITALS — BP 118/52 | HR 80 | Temp 98.4°F | Resp 16

## 2015-04-02 DIAGNOSIS — Z95828 Presence of other vascular implants and grafts: Secondary | ICD-10-CM

## 2015-04-02 DIAGNOSIS — C9200 Acute myeloblastic leukemia, not having achieved remission: Secondary | ICD-10-CM

## 2015-04-02 LAB — CBC WITH DIFFERENTIAL/PLATELET
BASO%: 0.5 % (ref 0.0–2.0)
Basophils Absolute: 0 10*3/uL (ref 0.0–0.1)
EOS ABS: 0 10*3/uL (ref 0.0–0.5)
EOS%: 0.1 % (ref 0.0–7.0)
HCT: 26.7 % — ABNORMAL LOW (ref 34.8–46.6)
HGB: 9.4 g/dL — ABNORMAL LOW (ref 11.6–15.9)
LYMPH%: 18.9 % (ref 14.0–49.7)
MCH: 29.3 pg (ref 25.1–34.0)
MCHC: 35 g/dL (ref 31.5–36.0)
MCV: 83.7 fL (ref 79.5–101.0)
MONO#: 0.7 10*3/uL (ref 0.1–0.9)
MONO%: 18.5 % — AB (ref 0.0–14.0)
NEUT%: 62 % (ref 38.4–76.8)
NEUTROS ABS: 2.5 10*3/uL (ref 1.5–6.5)
Platelets: 13 10*3/uL — ABNORMAL LOW (ref 145–400)
RBC: 3.19 10*6/uL — AB (ref 3.70–5.45)
RDW: 13.3 % (ref 11.2–14.5)
WBC: 4 10*3/uL (ref 3.9–10.3)
lymph#: 0.8 10*3/uL — ABNORMAL LOW (ref 0.9–3.3)

## 2015-04-02 LAB — HOLD TUBE, BLOOD BANK

## 2015-04-02 LAB — MAGNESIUM (CC13): Magnesium: 1.8 mg/dl (ref 1.5–2.5)

## 2015-04-02 LAB — COMPREHENSIVE METABOLIC PANEL (CC13)
ALT: 21 U/L (ref 0–55)
AST: 14 U/L (ref 5–34)
Albumin: 3.2 g/dL — ABNORMAL LOW (ref 3.5–5.0)
Alkaline Phosphatase: 96 U/L (ref 40–150)
Anion Gap: 8 mEq/L (ref 3–11)
BILIRUBIN TOTAL: 0.31 mg/dL (ref 0.20–1.20)
BUN: 22.9 mg/dL (ref 7.0–26.0)
CO2: 24 meq/L (ref 22–29)
Calcium: 9.4 mg/dL (ref 8.4–10.4)
Chloride: 111 mEq/L — ABNORMAL HIGH (ref 98–109)
Creatinine: 1.1 mg/dL (ref 0.6–1.1)
EGFR: 48 mL/min/{1.73_m2} — AB (ref >90)
GLUCOSE: 113 mg/dL (ref 70–140)
POTASSIUM: 4.1 meq/L (ref 3.5–5.1)
SODIUM: 143 meq/L (ref 136–145)
TOTAL PROTEIN: 6.6 g/dL (ref 6.4–8.3)

## 2015-04-02 MED ORDER — HEPARIN SOD (PORK) LOCK FLUSH 100 UNIT/ML IV SOLN: 250.0000 [IU] | INTRAVENOUS | Status: DC | PRN

## 2015-04-02 MED ORDER — HEPARIN SOD (PORK) LOCK FLUSH 100 UNIT/ML IV SOLN
500.0000 [IU] | Freq: Every day | INTRAVENOUS | Status: AC | PRN
  Administered 2015-04-02: 500 [IU]
  Filled 2015-04-02: qty 5

## 2015-04-02 MED ORDER — SODIUM CHLORIDE 0.9 % IJ SOLN: 3.0000 mL | INTRAMUSCULAR | Status: DC | PRN

## 2015-04-02 MED ORDER — SODIUM CHLORIDE 0.9 % IJ SOLN
10.0000 mL | INTRAMUSCULAR | Status: DC | PRN
  Administered 2015-04-02: 10 mL via INTRAVENOUS
  Filled 2015-04-02: qty 10

## 2015-04-02 MED ORDER — HEPARIN SOD (PORK) LOCK FLUSH 100 UNIT/ML IV SOLN
500.0000 [IU] | Freq: Once | INTRAVENOUS | Status: DC
  Filled 2015-04-02: qty 5

## 2015-04-02 MED ORDER — SODIUM CHLORIDE 0.9 % IV SOLN: 250.0000 mL | Freq: Once | INTRAVENOUS | Status: DC

## 2015-04-02 MED ORDER — SODIUM CHLORIDE 0.9 % IJ SOLN
10.0000 mL | INTRAMUSCULAR | Status: AC | PRN
  Administered 2015-04-02: 10 mL

## 2015-04-02 NOTE — Progress Notes (Signed)
Per Dr Burr Medico pt needs platelets . Pt notified and waiting in lobby for further instructions.

## 2015-04-02 NOTE — Procedures (Signed)
Associated diagnosis: Acute Myeloid leukemia MD: Burr Medico  Procedure Note: Infusion 1 unit of platelets; port de accessed per protocol Condition during procedure: Patient tolerated well Condition after procedure: Alert, oriented, and ambulatory, no complications noted

## 2015-04-03 LAB — PREPARE PLATELET PHERESIS: UNIT DIVISION: 0

## 2015-04-05 ENCOUNTER — Other Ambulatory Visit (HOSPITAL_BASED_OUTPATIENT_CLINIC_OR_DEPARTMENT_OTHER): Payer: Medicare Other

## 2015-04-05 ENCOUNTER — Ambulatory Visit: Payer: Medicare Other

## 2015-04-05 ENCOUNTER — Other Ambulatory Visit: Payer: Self-pay | Admitting: Neurology

## 2015-04-05 ENCOUNTER — Other Ambulatory Visit: Payer: Self-pay

## 2015-04-05 ENCOUNTER — Other Ambulatory Visit: Payer: Self-pay | Admitting: *Deleted

## 2015-04-05 ENCOUNTER — Ambulatory Visit (HOSPITAL_BASED_OUTPATIENT_CLINIC_OR_DEPARTMENT_OTHER): Payer: Medicare Other

## 2015-04-05 VITALS — BP 144/61 | HR 84 | Temp 97.0°F | Resp 16

## 2015-04-05 DIAGNOSIS — C9200 Acute myeloblastic leukemia, not having achieved remission: Secondary | ICD-10-CM

## 2015-04-05 DIAGNOSIS — Z95828 Presence of other vascular implants and grafts: Secondary | ICD-10-CM

## 2015-04-05 LAB — CBC WITH DIFFERENTIAL/PLATELET
BASO%: 0.6 % (ref 0.0–2.0)
BASOS ABS: 0 10*3/uL (ref 0.0–0.1)
EOS ABS: 0 10*3/uL (ref 0.0–0.5)
EOS%: 0 % (ref 0.0–7.0)
HEMATOCRIT: 23.7 % — AB (ref 34.8–46.6)
HEMOGLOBIN: 8.2 g/dL — AB (ref 11.6–15.9)
LYMPH#: 0.6 10*3/uL — AB (ref 0.9–3.3)
LYMPH%: 15.9 % (ref 14.0–49.7)
MCH: 29.1 pg (ref 25.1–34.0)
MCHC: 34.7 g/dL (ref 31.5–36.0)
MCV: 83.9 fL (ref 79.5–101.0)
MONO#: 0.7 10*3/uL (ref 0.1–0.9)
MONO%: 16.6 % — ABNORMAL HIGH (ref 0.0–14.0)
NEUT#: 2.7 10*3/uL (ref 1.5–6.5)
NEUT%: 66.9 % (ref 38.4–76.8)
Platelets: 45 10*3/uL — ABNORMAL LOW (ref 145–400)
RBC: 2.82 10*6/uL — ABNORMAL LOW (ref 3.70–5.45)
RDW: 13.3 % (ref 11.2–14.5)
WBC: 4 10*3/uL (ref 3.9–10.3)

## 2015-04-05 LAB — PREPARE RBC (CROSSMATCH)

## 2015-04-05 MED ORDER — HEPARIN SOD (PORK) LOCK FLUSH 100 UNIT/ML IV SOLN
500.0000 [IU] | Freq: Once | INTRAVENOUS | Status: DC
  Filled 2015-04-05: qty 5

## 2015-04-05 MED ORDER — SODIUM CHLORIDE 0.9 % IV SOLN
250.0000 mL | Freq: Once | INTRAVENOUS | Status: AC
  Administered 2015-04-05: 250 mL via INTRAVENOUS

## 2015-04-05 MED ORDER — SODIUM CHLORIDE 0.9 % IJ SOLN
10.0000 mL | INTRAMUSCULAR | Status: DC | PRN
  Administered 2015-04-05: 10 mL via INTRAVENOUS
  Filled 2015-04-05: qty 10

## 2015-04-05 MED ORDER — HEPARIN SOD (PORK) LOCK FLUSH 100 UNIT/ML IV SOLN
250.0000 [IU] | INTRAVENOUS | Status: AC | PRN
  Administered 2015-04-05: 250 [IU]
  Filled 2015-04-05: qty 5

## 2015-04-05 MED ORDER — SODIUM CHLORIDE 0.9 % IJ SOLN
3.0000 mL | INTRAMUSCULAR | Status: AC | PRN
  Administered 2015-04-05: 3 mL
  Filled 2015-04-05: qty 10

## 2015-04-05 NOTE — Patient Instructions (Signed)

## 2015-04-05 NOTE — Patient Instructions (Signed)

## 2015-04-06 LAB — TYPE AND SCREEN
ABO/RH(D): A POS
ANTIBODY SCREEN: NEGATIVE
UNIT DIVISION: 0
Unit division: 0

## 2015-04-09 ENCOUNTER — Other Ambulatory Visit (HOSPITAL_BASED_OUTPATIENT_CLINIC_OR_DEPARTMENT_OTHER): Payer: Medicare Other

## 2015-04-09 ENCOUNTER — Ambulatory Visit (HOSPITAL_BASED_OUTPATIENT_CLINIC_OR_DEPARTMENT_OTHER): Payer: Medicare Other

## 2015-04-09 DIAGNOSIS — C9200 Acute myeloblastic leukemia, not having achieved remission: Secondary | ICD-10-CM

## 2015-04-09 DIAGNOSIS — C9210 Chronic myeloid leukemia, BCR/ABL-positive, not having achieved remission: Secondary | ICD-10-CM

## 2015-04-09 DIAGNOSIS — Z452 Encounter for adjustment and management of vascular access device: Secondary | ICD-10-CM | POA: Diagnosis present

## 2015-04-09 LAB — CBC WITH DIFFERENTIAL/PLATELET
BASO%: 1 % (ref 0.0–2.0)
BASOS ABS: 0 10*3/uL (ref 0.0–0.1)
EOS%: 0 % (ref 0.0–7.0)
Eosinophils Absolute: 0 10*3/uL (ref 0.0–0.5)
HCT: 32.3 % — ABNORMAL LOW (ref 34.8–46.6)
HGB: 11.2 g/dL — ABNORMAL LOW (ref 11.6–15.9)
LYMPH%: 16.2 % (ref 14.0–49.7)
MCH: 29.4 pg (ref 25.1–34.0)
MCHC: 34.5 g/dL (ref 31.5–36.0)
MCV: 85.3 fL (ref 79.5–101.0)
MONO#: 0.7 10*3/uL (ref 0.1–0.9)
MONO%: 16.3 % — AB (ref 0.0–14.0)
NEUT#: 2.7 10*3/uL (ref 1.5–6.5)
NEUT%: 66.5 % (ref 38.4–76.8)
Platelets: 34 10*3/uL — ABNORMAL LOW (ref 145–400)
RBC: 3.79 10*6/uL (ref 3.70–5.45)
RDW: 13.2 % (ref 11.2–14.5)
WBC: 4.1 10*3/uL (ref 3.9–10.3)
lymph#: 0.7 10*3/uL — ABNORMAL LOW (ref 0.9–3.3)

## 2015-04-09 LAB — COMPREHENSIVE METABOLIC PANEL (CC13)
ALT: 27 U/L (ref 0–55)
AST: 16 U/L (ref 5–34)
Albumin: 3.1 g/dL — ABNORMAL LOW (ref 3.5–5.0)
Alkaline Phosphatase: 83 U/L (ref 40–150)
Anion Gap: 8 mEq/L (ref 3–11)
BUN: 22.5 mg/dL (ref 7.0–26.0)
CHLORIDE: 111 meq/L — AB (ref 98–109)
CO2: 24 meq/L (ref 22–29)
CREATININE: 1.1 mg/dL (ref 0.6–1.1)
Calcium: 9.2 mg/dL (ref 8.4–10.4)
EGFR: 50 mL/min/{1.73_m2} — ABNORMAL LOW (ref >90)
GLUCOSE: 105 mg/dL (ref 70–140)
POTASSIUM: 4.3 meq/L (ref 3.5–5.1)
SODIUM: 142 meq/L (ref 136–145)
Total Bilirubin: 0.32 mg/dL (ref 0.20–1.20)
Total Protein: 6.4 g/dL (ref 6.4–8.3)

## 2015-04-09 LAB — MAGNESIUM (CC13): Magnesium: 1.9 mg/dl (ref 1.5–2.5)

## 2015-04-09 LAB — HOLD TUBE, BLOOD BANK

## 2015-04-09 MED ORDER — SODIUM CHLORIDE 0.9 % IJ SOLN
10.0000 mL | INTRAMUSCULAR | Status: DC | PRN
  Filled 2015-04-09: qty 10

## 2015-04-09 MED ORDER — HEPARIN SOD (PORK) LOCK FLUSH 100 UNIT/ML IV SOLN
500.0000 [IU] | Freq: Once | INTRAVENOUS | Status: AC
  Administered 2015-04-09: 500 [IU] via INTRAVENOUS
  Filled 2015-04-09: qty 5

## 2015-04-12 ENCOUNTER — Ambulatory Visit (HOSPITAL_BASED_OUTPATIENT_CLINIC_OR_DEPARTMENT_OTHER): Payer: Medicare Other

## 2015-04-12 ENCOUNTER — Other Ambulatory Visit (HOSPITAL_BASED_OUTPATIENT_CLINIC_OR_DEPARTMENT_OTHER): Payer: Medicare Other

## 2015-04-12 ENCOUNTER — Other Ambulatory Visit: Payer: Medicare Other

## 2015-04-12 DIAGNOSIS — C9200 Acute myeloblastic leukemia, not having achieved remission: Secondary | ICD-10-CM

## 2015-04-12 DIAGNOSIS — Z452 Encounter for adjustment and management of vascular access device: Secondary | ICD-10-CM | POA: Diagnosis present

## 2015-04-12 DIAGNOSIS — D696 Thrombocytopenia, unspecified: Secondary | ICD-10-CM

## 2015-04-12 LAB — CBC WITH DIFFERENTIAL/PLATELET
BASO%: 0.3 % (ref 0.0–2.0)
BASOS ABS: 0 10*3/uL (ref 0.0–0.1)
EOS ABS: 0 10*3/uL (ref 0.0–0.5)
EOS%: 0 % (ref 0.0–7.0)
HCT: 30.4 % — ABNORMAL LOW (ref 34.8–46.6)
HGB: 10.8 g/dL — ABNORMAL LOW (ref 11.6–15.9)
LYMPH%: 16.4 % (ref 14.0–49.7)
MCH: 30 pg (ref 25.1–34.0)
MCHC: 35.5 g/dL (ref 31.5–36.0)
MCV: 84.4 fL (ref 79.5–101.0)
MONO#: 0.6 10*3/uL (ref 0.1–0.9)
MONO%: 15.9 % — AB (ref 0.0–14.0)
NEUT%: 67.4 % (ref 38.4–76.8)
NEUTROS ABS: 2.7 10*3/uL (ref 1.5–6.5)
Platelets: 36 10*3/uL — ABNORMAL LOW (ref 145–400)
RBC: 3.6 10*6/uL — AB (ref 3.70–5.45)
RDW: 12.9 % (ref 11.2–14.5)
WBC: 4 10*3/uL (ref 3.9–10.3)
lymph#: 0.7 10*3/uL — ABNORMAL LOW (ref 0.9–3.3)

## 2015-04-12 LAB — HOLD TUBE, BLOOD BANK

## 2015-04-12 MED ORDER — HEPARIN SOD (PORK) LOCK FLUSH 100 UNIT/ML IV SOLN
500.0000 [IU] | Freq: Once | INTRAVENOUS | Status: AC
  Administered 2015-04-12: 500 [IU] via INTRAVENOUS
  Filled 2015-04-12: qty 5

## 2015-04-12 MED ORDER — SODIUM CHLORIDE 0.9 % IJ SOLN
10.0000 mL | INTRAMUSCULAR | Status: DC | PRN
  Filled 2015-04-12: qty 10

## 2015-04-16 DIAGNOSIS — Z7189 Other specified counseling: Secondary | ICD-10-CM | POA: Diagnosis not present

## 2015-04-16 DIAGNOSIS — C9201 Acute myeloblastic leukemia, in remission: Secondary | ICD-10-CM | POA: Diagnosis not present

## 2015-04-16 DIAGNOSIS — Z01818 Encounter for other preprocedural examination: Secondary | ICD-10-CM | POA: Diagnosis not present

## 2015-04-25 DIAGNOSIS — Z5111 Encounter for antineoplastic chemotherapy: Secondary | ICD-10-CM | POA: Diagnosis not present

## 2015-04-25 DIAGNOSIS — I10 Essential (primary) hypertension: Secondary | ICD-10-CM | POA: Diagnosis present

## 2015-04-25 DIAGNOSIS — M793 Panniculitis, unspecified: Secondary | ICD-10-CM | POA: Diagnosis present

## 2015-04-25 DIAGNOSIS — M1990 Unspecified osteoarthritis, unspecified site: Secondary | ICD-10-CM | POA: Diagnosis present

## 2015-04-25 DIAGNOSIS — E039 Hypothyroidism, unspecified: Secondary | ICD-10-CM | POA: Diagnosis present

## 2015-04-25 DIAGNOSIS — Z8249 Family history of ischemic heart disease and other diseases of the circulatory system: Secondary | ICD-10-CM | POA: Diagnosis not present

## 2015-04-25 DIAGNOSIS — E8770 Fluid overload, unspecified: Secondary | ICD-10-CM | POA: Diagnosis present

## 2015-04-25 DIAGNOSIS — I493 Ventricular premature depolarization: Secondary | ICD-10-CM | POA: Diagnosis not present

## 2015-04-25 DIAGNOSIS — D696 Thrombocytopenia, unspecified: Secondary | ICD-10-CM | POA: Diagnosis present

## 2015-04-25 DIAGNOSIS — J9601 Acute respiratory failure with hypoxia: Secondary | ICD-10-CM | POA: Diagnosis present

## 2015-04-25 DIAGNOSIS — J811 Chronic pulmonary edema: Secondary | ICD-10-CM | POA: Diagnosis present

## 2015-04-25 DIAGNOSIS — Z87891 Personal history of nicotine dependence: Secondary | ICD-10-CM | POA: Diagnosis not present

## 2015-04-25 DIAGNOSIS — D709 Neutropenia, unspecified: Secondary | ICD-10-CM | POA: Diagnosis present

## 2015-04-25 DIAGNOSIS — J90 Pleural effusion, not elsewhere classified: Secondary | ICD-10-CM | POA: Diagnosis present

## 2015-04-25 DIAGNOSIS — R21 Rash and other nonspecific skin eruption: Secondary | ICD-10-CM | POA: Diagnosis not present

## 2015-04-25 DIAGNOSIS — C9201 Acute myeloblastic leukemia, in remission: Secondary | ICD-10-CM | POA: Diagnosis not present

## 2015-04-25 DIAGNOSIS — E785 Hyperlipidemia, unspecified: Secondary | ICD-10-CM | POA: Diagnosis present

## 2015-04-26 ENCOUNTER — Other Ambulatory Visit: Payer: Self-pay | Admitting: *Deleted

## 2015-04-26 ENCOUNTER — Telehealth: Payer: Self-pay | Admitting: *Deleted

## 2015-04-26 ENCOUNTER — Telehealth: Payer: Self-pay | Admitting: Hematology

## 2015-04-26 DIAGNOSIS — C9200 Acute myeloblastic leukemia, not having achieved remission: Secondary | ICD-10-CM

## 2015-04-26 NOTE — Telephone Encounter (Signed)
Called patient and she is aware of her appointments °

## 2015-04-26 NOTE — Telephone Encounter (Signed)
Faxed appt calendar to Darliss Cheney, RN @ [facility].  Alyse Low called and stated she had received faxed copies and will forward calendar appt to pt.

## 2015-04-26 NOTE — Telephone Encounter (Signed)
Received faxed orders for lab, and Neulasta to be done on 05/03/15, and labs twice weekly from Darliss Cheney, RN @ [facility].  Dr. Burr Medico notified.  OK to schedule pt for procedures as per md. Spoke with Alyse Low, and was informed that pt is currently in hospital for her last cycle of chemo.  Pt will be there until either  "Sunday or Monday next week.   Christy would like for labs to be done until end of November.  Pt has f/u appt with [facility] on  06/13/15.  Informed Christy that pt's appt calendar will be faxed to her to give to pt.  Christy voiced understanding. Christy, RN   Phone     [phone]   ;     Fax       [phone]" [phone].

## 2015-05-02 ENCOUNTER — Other Ambulatory Visit: Payer: Self-pay | Admitting: *Deleted

## 2015-05-02 DIAGNOSIS — C9200 Acute myeloblastic leukemia, not having achieved remission: Secondary | ICD-10-CM

## 2015-05-02 MED ORDER — HEPARIN SOD (PORK) LOCK FLUSH 100 UNIT/ML IV SOLN
500.0000 [IU] | Freq: Once | INTRAVENOUS | Status: DC
  Filled 2015-05-02: qty 5

## 2015-05-02 MED ORDER — SODIUM CHLORIDE 0.9 % IJ SOLN
10.0000 mL | Freq: Once | INTRAMUSCULAR | Status: DC
  Filled 2015-05-02: qty 10

## 2015-05-03 ENCOUNTER — Ambulatory Visit (HOSPITAL_COMMUNITY)
Admission: RE | Admit: 2015-05-03 | Discharge: 2015-05-03 | Disposition: A | Discharge status: Home / Self Care | Payer: Medicare Other | Source: Ambulatory Visit | Attending: Hematology | Admitting: Hematology

## 2015-05-03 ENCOUNTER — Other Ambulatory Visit: Payer: Medicare Other

## 2015-05-03 ENCOUNTER — Encounter: Payer: Self-pay | Admitting: Hematology

## 2015-05-03 ENCOUNTER — Other Ambulatory Visit (HOSPITAL_BASED_OUTPATIENT_CLINIC_OR_DEPARTMENT_OTHER): Payer: Medicare Other

## 2015-05-03 ENCOUNTER — Ambulatory Visit (HOSPITAL_BASED_OUTPATIENT_CLINIC_OR_DEPARTMENT_OTHER): Payer: Medicare Other

## 2015-05-03 ENCOUNTER — Telehealth: Payer: Self-pay | Admitting: *Deleted

## 2015-05-03 ENCOUNTER — Telehealth: Payer: Self-pay | Admitting: Hematology

## 2015-05-03 ENCOUNTER — Ambulatory Visit (HOSPITAL_BASED_OUTPATIENT_CLINIC_OR_DEPARTMENT_OTHER): Payer: Medicare Other | Admitting: Hematology

## 2015-05-03 VITALS — BP 133/56 | HR 85 | Temp 97.4°F

## 2015-05-03 VITALS — BP 137/89 | HR 86 | Temp 98.2°F | Resp 18 | Ht 60.0 in | Wt 166.8 lb

## 2015-05-03 VITALS — BP 128/56 | HR 83 | Temp 97.9°F | Resp 16

## 2015-05-03 DIAGNOSIS — C9200 Acute myeloblastic leukemia, not having achieved remission: Secondary | ICD-10-CM

## 2015-05-03 DIAGNOSIS — C9201 Acute myeloblastic leukemia, in remission: Secondary | ICD-10-CM | POA: Diagnosis not present

## 2015-05-03 DIAGNOSIS — Z5189 Encounter for other specified aftercare: Secondary | ICD-10-CM | POA: Diagnosis not present

## 2015-05-03 DIAGNOSIS — Z452 Encounter for adjustment and management of vascular access device: Secondary | ICD-10-CM

## 2015-05-03 LAB — CBC WITH DIFFERENTIAL/PLATELET
BASO%: 0.6 % (ref 0.0–2.0)
Basophils Absolute: 0 10*3/uL (ref 0.0–0.1)
EOS ABS: 0 10*3/uL (ref 0.0–0.5)
EOS%: 0 % (ref 0.0–7.0)
HCT: 29 % — ABNORMAL LOW (ref 34.8–46.6)
HGB: 10.1 g/dL — ABNORMAL LOW (ref 11.6–15.9)
LYMPH%: 16.5 % (ref 14.0–49.7)
MCH: 30.1 pg (ref 25.1–34.0)
MCHC: 34.8 g/dL (ref 31.5–36.0)
MCV: 86.3 fL (ref 79.5–101.0)
MONO#: 0.1 10*3/uL (ref 0.1–0.9)
MONO%: 4 % (ref 0.0–14.0)
NEUT%: 78.9 % — ABNORMAL HIGH (ref 38.4–76.8)
NEUTROS ABS: 1.4 10*3/uL — AB (ref 1.5–6.5)
NRBC: 0 % (ref 0–0)
PLATELETS: 17 10*3/uL — AB (ref 145–400)
RBC: 3.36 10*6/uL — AB (ref 3.70–5.45)
RDW: 13.9 % (ref 11.2–14.5)
WBC: 1.8 10*3/uL — AB (ref 3.9–10.3)
lymph#: 0.3 10*3/uL — ABNORMAL LOW (ref 0.9–3.3)

## 2015-05-03 LAB — COMPREHENSIVE METABOLIC PANEL (CC13)
ALT: 14 U/L (ref 0–55)
ANION GAP: 8 meq/L (ref 3–11)
AST: 10 U/L (ref 5–34)
Albumin: 2.9 g/dL — ABNORMAL LOW (ref 3.5–5.0)
Alkaline Phosphatase: 86 U/L (ref 40–150)
BUN: 23.7 mg/dL (ref 7.0–26.0)
CHLORIDE: 109 meq/L (ref 98–109)
CO2: 26 meq/L (ref 22–29)
Calcium: 9.2 mg/dL (ref 8.4–10.4)
Creatinine: 1 mg/dL (ref 0.6–1.1)
EGFR: 54 mL/min/{1.73_m2} — AB (ref >90)
Glucose: 139 mg/dl (ref 70–140)
POTASSIUM: 4 meq/L (ref 3.5–5.1)
SODIUM: 143 meq/L (ref 136–145)
Total Bilirubin: 0.48 mg/dL (ref 0.20–1.20)
Total Protein: 6.4 g/dL (ref 6.4–8.3)

## 2015-05-03 LAB — HOLD TUBE, BLOOD BANK

## 2015-05-03 LAB — MAGNESIUM (CC13): Magnesium: 1.9 mg/dl (ref 1.5–2.5)

## 2015-05-03 MED ORDER — HEPARIN SOD (PORK) LOCK FLUSH 100 UNIT/ML IV SOLN: 500.0000 [IU] | Freq: Every day | INTRAVENOUS | Status: DC | PRN

## 2015-05-03 MED ORDER — SODIUM CHLORIDE 0.9 % IJ SOLN
10.0000 mL | Freq: Once | INTRAMUSCULAR | Status: AC
  Administered 2015-05-03: 10 mL
  Filled 2015-05-03: qty 10

## 2015-05-03 MED ORDER — HEPARIN SOD (PORK) LOCK FLUSH 100 UNIT/ML IV SOLN
500.0000 [IU] | Freq: Once | INTRAVENOUS | Status: AC
  Administered 2015-05-03: 500 [IU]
  Filled 2015-05-03: qty 5

## 2015-05-03 MED ORDER — PEGFILGRASTIM INJECTION 6 MG/0.6ML
6.0000 mg | Freq: Once | SUBCUTANEOUS | Status: AC
  Administered 2015-05-03: 6 mg via SUBCUTANEOUS
  Filled 2015-05-03: qty 0.6

## 2015-05-03 MED ORDER — SODIUM CHLORIDE 0.9 % IV SOLN: 250.0000 mL | Freq: Once | INTRAVENOUS | Status: DC

## 2015-05-03 NOTE — Progress Notes (Signed)
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 Myeloid panel PENDING.  11/15/2014 - Chemotherapy Induction with cytarabine and daunorubicin (7+3), nadir bmbx 11/28/14 showing residual disease  11/29/2014 - Chemotherapy Re-induction with cytarabine and daunorubicin (5+2); nadir bmbx 12/11/14 negative  12/28/2014 Remission Recovery bmbx c/w remission with incomplete platelet recovery (CRi)  01/17/2015 - Chemotherapy C1 Hidac 1gm/m2 + Neulasta  03/08/2015 -Chemotherapy C2 Hidac 1gm/m2 + Neulasta   04/26/2015-Chemotherapy C2 Hidac 1gm/m2 + Neulasta    INTERVAL HISTORY: Mrs. Guevarra returns for follow-up. She received second cycle chemotherapy Hidac at [facility] about one week ago, she tolerated the chemotherapy very well, with discharged home today days ago. She is here for Neulasta injection, lab work and blood transfusion if needed. She feels well overall, denies any pain, nausea, fever, or any sign of bleeding. Her appetite and energy level is fair. She is accompanied to the clinic by her husband.    REVIEW OF SYSTEMS:   Constitutional: Denies fevers, chills or abnormal weight loss, (+) fatigue  Eyes: Denies blurriness of vision Ears, nose, mouth, throat, and face: Denies mucositis or sore throat Respiratory: Denies cough, dyspnea or wheezes Cardiovascular: Denies palpitation, chest discomfort or lower extremity swelling Gastrointestinal:  Denies nausea, heartburn or change in bowel habits Skin: Denies abnormal skin rashes Lymphatics: Denies new  lymphadenopathy or easy bruising Neurological:Denies numbness, tingling or new weaknesses Behavioral/Psych: Mood is stable, no new changes  All other systems were reviewed with the patient and are negative.  MEDICAL HISTORY:  Past Medical History  Diagnosis Date  . Allergy   . Anemia     nos  . Hyperlipidemia   . Hypertension   . OA (osteoarthritis)   . OP (osteoporosis)     last dexa 08-15-08  . Endometrial polyp     post-menopausal bleeding    SURGICAL HISTORY: Past Surgical History  Procedure Laterality Date  . Tonsilectomy, adenoidectomy, bilateral myringotomy and tubes    . Herniated disc      L4-5 per Dr Saintclair Halsted  . Endometrial polypectomy      with D and C 03-08-09 pr dr Gardenia Phlegm  . Colonoscopy  02-14-08    per Dr. Ardis Hughs, repeat in 5 yrs  . Dilation and curettage of uterus      I have reviewed the social history and family history with the patient and they are unchanged from previous note.  ALLERGIES:  is allergic to penicillins and aspirin.  MEDICATIONS:  Current Outpatient Prescriptions  Medication Sig Dispense Refill  . amLODipine (NORVASC) 10 MG tablet Take 10 mg by mouth daily.    Marland Kitchen atorvastatin (LIPITOR) 20 MG tablet TAKE 1 TABLET (20 MG TOTAL) BY MOUTH DAILY. 90 tablet 2  . CALCIUM-VITAMIN D PO Take 1 tablet by mouth daily.     . fluconazole (DIFLUCAN) 200 MG tablet Take 200 mg by mouth daily.    Marland Kitchen levofloxacin (LEVAQUIN) 500 MG tablet Take 500 mg by mouth daily.    Marland Kitchen levothyroxine (SYNTHROID, LEVOTHROID) 50 MCG tablet TAKE 1 TABLET (50 MCG TOTAL) BY MOUTH DAILY. 90 tablet 2  . lidocaine-prilocaine (EMLA) cream Apply a  small amount to skin over PAC 30-45 minutes prior to access.    . loratadine (CLARITIN) 10 MG tablet Take 10 mg by mouth daily.    . Metoprolol Tartrate 37.5 MG TABS Take 37.5 mg by mouth 2 (two) times daily.    . Multiple Vitamins-Minerals (ICAPS PO) Take 1 tablet by mouth daily. macavue    . nabumetone (RELAFEN) 750 MG tablet TAKE 1 TABLET (750  MG TOTAL) BY MOUTH 2 (TWO) TIMES DAILY. (Patient not taking: Reported on 05/03/2015) 180 tablet 2  . ondansetron (ZOFRAN) 8 MG tablet Take 8 mg by mouth every 8 (eight) hours.     No current facility-administered medications for this visit.    PHYSICAL EXAMINATION: ECOG PERFORMANCE STATUS: 2 - Symptomatic, <50% confined to bed  Filed Vitals:   05/03/15 0844  BP: 137/89  Pulse: 86  Temp: 98.2 F (36.8 C)  Resp: 18   Filed Weights   05/03/15 0844  Weight: 166 lb 12.8 oz (75.66 kg)    GENERAL:alert, no distress and comfortable SKIN: skin color, texture, turgor are normal, no rashes or significant lesions, except a small ecchymosis and subcutaneous hematoma at the lateral of her left thigh EYES: normal, Conjunctiva are pink and non-injected, sclera clear OROPHARYNX:no exudate, no erythema and lips, buccal mucosa, and tongue normal  NECK: supple, thyroid normal size, non-tender, without nodularity LYMPH:  no palpable lymphadenopathy in the cervical, axillary or inguinal LUNGS: clear to auscultation and percussion with normal breathing effort HEART: regular rate & rhythm and no murmurs and no lower extremity edema ABDOMEN:abdomen soft, non-tender and normal bowel sounds Musculoskeletal:no cyanosis of digits and no clubbing  NEURO: alert & oriented x 3 with fluent speech, no focal motor/sensory deficits  LABORATORY DATA:  I have reviewed the data as listed CBC Latest Ref Rng 05/03/2015 04/12/2015 04/09/2015  WBC 3.9 - 10.3 10e3/uL 1.8(L) 4.0 4.1  Hemoglobin 11.6 - 15.9 g/dL 10.1(L) 10.8(L) 11.2(L)  Hematocrit 34.8 - 46.6 % 29.0(L) 30.4(L) 32.3(L)  Platelets 145 - 400 10e3/uL 17(L) 36(L) 34(L)     CMP Latest Ref Rng 04/09/2015 04/02/2015 03/26/2015  Glucose 70 - 140 mg/dl 105 113 117  BUN 7.0 - 26.0 mg/dL 22.5 22.9 29.4(H)  Creatinine 0.6 - 1.1 mg/dL 1.1 1.1 1.4(H)  Sodium 136 - 145 mEq/L 142 143 143  Potassium 3.5 - 5.1 mEq/L 4.3 4.1 4.4  Chloride 96 - 112 mEq/L - - -  CO2 22 -  29 mEq/L _0 Calcium 8.4 - 10.4 mg/dL 9.2 9.4 9.7  Total Protein 6.4 - 8.3 g/dL 6.4 6.6 6.8  Total Bilirubin 0.20 - 1.20 mg/dL 0.32 0.31 0.38  Alkaline Phos 40 - 150 U/L 83 96 95  AST 5 - 34 U/L _1 ALT 0 - 55 U/L _2 ANC 1.6K today    RADIOGRAPHIC STUDIES: I have personally reviewed the radiological images as listed and agreed with the findings in the report. No results found.   ASSESSMENT & PLAN:  69 year old Caucasian female  1. AML, in remission, normal cytogenetics -She is currently status post her third cycle of Hidac -She has met Dr. Lavone Neri at [facility] for stem cell transplant, she has not made her mind yet. Lab reviewed with her. Her ANC 1.4 today, she will receive Neulasta today Platelet 17 K today, no bleeding, she will receive 1 unit of pheresed platelet, with irradiation -She will return every Monday and Thursday for the next few weeks for lab and transfusion,  for plt<20K, and Hg<9.0 -I will see her back as needed  -she is scheduled to have a bone marrow biopsy and see Dr. Lissa Merlin on November 7   Plan -plt transfusion today -lab every Monday and Thursday    ll questions were answered. The patient knows to call the clinic with any problems, questions or concerns. No barriers to learning was detected.  I spent 10 minutes counseling the patient face to face. The total time spent in the appointment was 15 minutes and more than 50% was on counseling and review of test results     Truitt Merle, MD 05/03/2015 9:05 AM

## 2015-05-03 NOTE — Procedures (Signed)
Associated diagnosis: Acute Myeloid leukemia MD: Burr Medico  Procedure Note: Infusion 1 unit of platelets; port de accessed per protocol Condition during procedure: Patient tolerated well Condition after procedure: Alert, oriented, and ambulatory, no complications noted

## 2015-05-03 NOTE — Telephone Encounter (Signed)
Per staff message and POF I have scheduled appts. Advised scheduler of appts. Also no available on 11/3 and 11/7 JMW

## 2015-05-03 NOTE — Addendum Note (Signed)
Addended by: Truitt Merle on: 05/03/2015 09:46 AM   Modules accepted: Orders

## 2015-05-03 NOTE — Telephone Encounter (Signed)
Copy of lab results from today including cbc, cmet, & mg+ faxed to [facility]/Christy RN to [phone].

## 2015-05-03 NOTE — Telephone Encounter (Signed)
perpof to sch pt appt-sent MW email to sch transfusion-call pt after reply

## 2015-05-04 LAB — PREPARE PLATELET PHERESIS: Unit division: 0

## 2015-05-07 ENCOUNTER — Telehealth: Payer: Self-pay | Admitting: Hematology

## 2015-05-07 ENCOUNTER — Ambulatory Visit: Payer: Medicare Other

## 2015-05-07 ENCOUNTER — Other Ambulatory Visit (HOSPITAL_BASED_OUTPATIENT_CLINIC_OR_DEPARTMENT_OTHER): Payer: Medicare Other

## 2015-05-07 ENCOUNTER — Telehealth: Payer: Self-pay | Admitting: *Deleted

## 2015-05-07 ENCOUNTER — Ambulatory Visit (HOSPITAL_BASED_OUTPATIENT_CLINIC_OR_DEPARTMENT_OTHER): Payer: Medicare Other

## 2015-05-07 ENCOUNTER — Other Ambulatory Visit: Payer: Self-pay | Admitting: *Deleted

## 2015-05-07 ENCOUNTER — Other Ambulatory Visit: Payer: Medicare Other

## 2015-05-07 DIAGNOSIS — C9200 Acute myeloblastic leukemia, not having achieved remission: Secondary | ICD-10-CM | POA: Diagnosis not present

## 2015-05-07 DIAGNOSIS — Z95828 Presence of other vascular implants and grafts: Secondary | ICD-10-CM

## 2015-05-07 LAB — CBC WITH DIFFERENTIAL/PLATELET
BASO%: 0.8 % (ref 0.0–2.0)
Basophils Absolute: 0 10*3/uL (ref 0.0–0.1)
EOS ABS: 0 10*3/uL (ref 0.0–0.5)
EOS%: 0.8 % (ref 0.0–7.0)
HEMATOCRIT: 25.4 % — AB (ref 34.8–46.6)
HEMOGLOBIN: 8.6 g/dL — AB (ref 11.6–15.9)
LYMPH#: 0.3 10*3/uL — AB (ref 0.9–3.3)
LYMPH%: 70.3 % — ABNORMAL HIGH (ref 14.0–49.7)
MCH: 29.4 pg (ref 25.1–34.0)
MCHC: 33.7 g/dL (ref 31.5–36.0)
MCV: 87 fL (ref 79.5–101.0)
MONO#: 0 10*3/uL — AB (ref 0.1–0.9)
MONO%: 2.8 % (ref 0.0–14.0)
NEUT%: 25.3 % — ABNORMAL LOW (ref 38.4–76.8)
NEUTROS ABS: 0.1 10*3/uL — AB (ref 1.5–6.5)
PLATELETS: 5 10*3/uL — AB (ref 145–400)
RBC: 2.91 10*6/uL — ABNORMAL LOW (ref 3.70–5.45)
RDW: 13.9 % (ref 11.2–14.5)
WBC: 0.4 10*3/uL — AB (ref 3.9–10.3)

## 2015-05-07 LAB — COMPREHENSIVE METABOLIC PANEL (CC13)
ALT: 17 U/L (ref 0–55)
ANION GAP: 5 meq/L (ref 3–11)
AST: 10 U/L (ref 5–34)
Albumin: 3.2 g/dL — ABNORMAL LOW (ref 3.5–5.0)
Alkaline Phosphatase: 94 U/L (ref 40–150)
BILIRUBIN TOTAL: 0.78 mg/dL (ref 0.20–1.20)
BUN: 32.9 mg/dL — AB (ref 7.0–26.0)
CALCIUM: 9.3 mg/dL (ref 8.4–10.4)
CO2: 27 mEq/L (ref 22–29)
CREATININE: 1.1 mg/dL (ref 0.6–1.1)
Chloride: 110 mEq/L — ABNORMAL HIGH (ref 98–109)
EGFR: 49 mL/min/{1.73_m2} — ABNORMAL LOW (ref >90)
Glucose: 111 mg/dl (ref 70–140)
Potassium: 4.5 mEq/L (ref 3.5–5.1)
Sodium: 142 mEq/L (ref 136–145)
TOTAL PROTEIN: 6.4 g/dL (ref 6.4–8.3)

## 2015-05-07 LAB — TYPE AND SCREEN
ABO/RH(D): A POS
ANTIBODY SCREEN: NEGATIVE

## 2015-05-07 LAB — PREPARE RBC (CROSSMATCH)

## 2015-05-07 LAB — MAGNESIUM (CC13): MAGNESIUM: 2 mg/dL (ref 1.5–2.5)

## 2015-05-07 LAB — HOLD TUBE, BLOOD BANK

## 2015-05-07 MED ORDER — SODIUM CHLORIDE 0.9 % IV SOLN
250.0000 mL | Freq: Once | INTRAVENOUS | Status: AC
  Administered 2015-05-07: 250 mL via INTRAVENOUS

## 2015-05-07 MED ORDER — SODIUM CHLORIDE 0.9 % IJ SOLN
10.0000 mL | INTRAMUSCULAR | Status: DC | PRN
  Administered 2015-05-07: 10 mL via INTRAVENOUS
  Filled 2015-05-07: qty 10

## 2015-05-07 MED ORDER — SODIUM CHLORIDE 0.9 % IJ SOLN
3.0000 mL | INTRAMUSCULAR | Status: DC | PRN
  Filled 2015-05-07: qty 10

## 2015-05-07 MED ORDER — HEPARIN SOD (PORK) LOCK FLUSH 100 UNIT/ML IV SOLN
500.0000 [IU] | Freq: Every day | INTRAVENOUS | Status: AC | PRN
  Administered 2015-05-07: 500 [IU]
  Filled 2015-05-07: qty 5

## 2015-05-07 MED ORDER — SODIUM CHLORIDE 0.9 % IJ SOLN
10.0000 mL | INTRAMUSCULAR | Status: AC | PRN
  Administered 2015-05-07: 10 mL
  Filled 2015-05-07: qty 10

## 2015-05-07 NOTE — Patient Instructions (Signed)
Blood Transfusion   A blood transfusion is a procedure that gives you donated blood through an IV tube. You may need blood because of illness, surgery, or injury. The blood may come from a donor. The blood may also be your own blood that you donated earlier.  The blood you get is made up of different types of cells. You may get:    Red blood cells. These carry oxygen and replace lost blood.    Platelets. These control bleeding.    Plasma. This helps blood to clot.  If you have a clotting disorder, you may also get other types of blood products.   BEFORE THE PROCEDURE   You may have a blood test. This finds out what type of blood you have. It also finds out what kind of blood your body will accept.    If you are going to have a planned surgery, you may donate your own blood. This is done in case you need to have a transfusion.    If you have had an allergic transfusion reaction before, you may be given medicine to help prevent a reaction. Take this medicine only as told by your doctor.   You will have your temperature, blood pressure, and pulse checked.  PROCEDURE    An IV will be started in your hand or arm.    The bag of donated blood will be attached to your IV and run into your vein.    A doctor will regularly check your temperature, blood pressure, and pulse during the procedure. This is done to find any early signs of a transfusion reaction.   If you have any signs or symptoms of a reaction, the procedure may be stopped and you may be given medicine.    When the transfusion is over, your IV will be removed.    Pressure may be applied to the IV site for a few minutes.    A bandage (dressing) will be applied.   The procedure may vary among doctors and hospitals.   AFTER THE PROCEDURE   Your blood pressure, temperature, and pulse will be checked regularly.     This information is not intended to replace advice given to you by your health care provider. Make sure you discuss any questions  you have with your health care provider.     Document Released: 09/19/2008 Document Revised: 07/14/2014 Document Reviewed: 05/03/2014  Elsevier Interactive Patient Education 2016 Elsevier Inc.

## 2015-05-07 NOTE — Telephone Encounter (Signed)
I have moved lab/flush from 11/3 to 11/2 due to a meeting. Patient aware

## 2015-05-07 NOTE — Telephone Encounter (Signed)
Appointments made and patient will get a new schedule

## 2015-05-08 ENCOUNTER — Ambulatory Visit (HOSPITAL_COMMUNITY)
Admission: RE | Admit: 2015-05-08 | Discharge: 2015-05-08 | Disposition: A | Discharge status: Home / Self Care | Payer: Medicare Other | Source: Ambulatory Visit | Attending: Hematology | Admitting: Hematology

## 2015-05-08 DIAGNOSIS — C9200 Acute myeloblastic leukemia, not having achieved remission: Secondary | ICD-10-CM | POA: Insufficient documentation

## 2015-05-08 LAB — PREPARE PLATELET PHERESIS: Unit division: 0

## 2015-05-08 LAB — TYPE AND SCREEN
ABO/RH(D): A POS
ANTIBODY SCREEN: NEGATIVE
UNIT DIVISION: 0
Unit division: 0

## 2015-05-09 ENCOUNTER — Ambulatory Visit (HOSPITAL_BASED_OUTPATIENT_CLINIC_OR_DEPARTMENT_OTHER): Payer: Medicare Other

## 2015-05-09 ENCOUNTER — Telehealth: Payer: Self-pay | Admitting: *Deleted

## 2015-05-09 ENCOUNTER — Other Ambulatory Visit: Payer: Self-pay | Admitting: *Deleted

## 2015-05-09 ENCOUNTER — Other Ambulatory Visit: Payer: Self-pay | Admitting: Hematology

## 2015-05-09 ENCOUNTER — Other Ambulatory Visit (HOSPITAL_BASED_OUTPATIENT_CLINIC_OR_DEPARTMENT_OTHER): Payer: Medicare Other

## 2015-05-09 DIAGNOSIS — C9200 Acute myeloblastic leukemia, not having achieved remission: Secondary | ICD-10-CM | POA: Diagnosis present

## 2015-05-09 DIAGNOSIS — Z452 Encounter for adjustment and management of vascular access device: Secondary | ICD-10-CM

## 2015-05-09 DIAGNOSIS — C9201 Acute myeloblastic leukemia, in remission: Secondary | ICD-10-CM

## 2015-05-09 DIAGNOSIS — Z95828 Presence of other vascular implants and grafts: Secondary | ICD-10-CM

## 2015-05-09 LAB — CBC WITH DIFFERENTIAL/PLATELET
BASO%: 0 % (ref 0.0–2.0)
BASOS ABS: 0 10*3/uL (ref 0.0–0.1)
EOS ABS: 0 10*3/uL (ref 0.0–0.5)
EOS%: 0 % (ref 0.0–7.0)
HEMATOCRIT: 32.5 % — AB (ref 34.8–46.6)
HGB: 11.3 g/dL — ABNORMAL LOW (ref 11.6–15.9)
LYMPH%: 97.2 % — AB (ref 14.0–49.7)
MCH: 29.8 pg (ref 25.1–34.0)
MCHC: 34.8 g/dL (ref 31.5–36.0)
MCV: 85.8 fL (ref 79.5–101.0)
MONO#: 0 10*3/uL — AB (ref 0.1–0.9)
MONO%: 0 % (ref 0.0–14.0)
NEUT%: 2.8 % — AB (ref 38.4–76.8)
NEUTROS ABS: 0 10*3/uL — AB (ref 1.5–6.5)
PLATELETS: 12 10*3/uL — AB (ref 145–400)
RBC: 3.79 10*6/uL (ref 3.70–5.45)
RDW: 13.4 % (ref 11.2–14.5)
WBC: 0.4 10*3/uL — CL (ref 3.9–10.3)
lymph#: 0.4 10*3/uL — ABNORMAL LOW (ref 0.9–3.3)
nRBC: 0 % (ref 0–0)

## 2015-05-09 LAB — COMPREHENSIVE METABOLIC PANEL (CC13)
ALT: 15 U/L (ref 0–55)
ANION GAP: 8 meq/L (ref 3–11)
AST: 9 U/L (ref 5–34)
Albumin: 3.2 g/dL — ABNORMAL LOW (ref 3.5–5.0)
Alkaline Phosphatase: 105 U/L (ref 40–150)
BILIRUBIN TOTAL: 0.99 mg/dL (ref 0.20–1.20)
BUN: 27.8 mg/dL — ABNORMAL HIGH (ref 7.0–26.0)
CO2: 28 meq/L (ref 22–29)
Calcium: 9.5 mg/dL (ref 8.4–10.4)
Chloride: 106 mEq/L (ref 98–109)
Creatinine: 1.3 mg/dL — ABNORMAL HIGH (ref 0.6–1.1)
EGFR: 42 mL/min/{1.73_m2} — AB (ref >90)
Glucose: 142 mg/dl — ABNORMAL HIGH (ref 70–140)
POTASSIUM: 4 meq/L (ref 3.5–5.1)
Sodium: 142 mEq/L (ref 136–145)
TOTAL PROTEIN: 6.8 g/dL (ref 6.4–8.3)

## 2015-05-09 LAB — MAGNESIUM (CC13): Magnesium: 2 mg/dl (ref 1.5–2.5)

## 2015-05-09 LAB — HOLD TUBE, BLOOD BANK

## 2015-05-09 MED ORDER — HEPARIN SOD (PORK) LOCK FLUSH 100 UNIT/ML IV SOLN
500.0000 [IU] | Freq: Once | INTRAVENOUS | Status: AC
  Administered 2015-05-09: 500 [IU] via INTRAVENOUS
  Filled 2015-05-09: qty 5

## 2015-05-09 MED ORDER — SODIUM CHLORIDE 0.9 % IJ SOLN
10.0000 mL | INTRAMUSCULAR | Status: DC | PRN
  Administered 2015-05-09: 10 mL via INTRAVENOUS
  Filled 2015-05-09: qty 10

## 2015-05-09 NOTE — Patient Instructions (Signed)

## 2015-05-09 NOTE — Telephone Encounter (Signed)
Labs from today faxed to [facility] RN/[facility] @ [phone].  Pt notified of results & neutropenic precautions discussed.  Pt to receive Pheresis platelet transfusion tomorrow @ 9am at [facility].

## 2015-05-09 NOTE — Telephone Encounter (Signed)
Scheduled appt in [facility] for Monday @ 9am after lab & flush here for platelet transfusion.

## 2015-05-10 ENCOUNTER — Telehealth: Payer: Self-pay | Admitting: *Deleted

## 2015-05-10 ENCOUNTER — Ambulatory Visit (HOSPITAL_COMMUNITY)
Admission: RE | Admit: 2015-05-10 | Discharge: 2015-05-10 | Disposition: A | Discharge status: Home / Self Care | Payer: Medicare Other | Source: Ambulatory Visit | Attending: Hematology | Admitting: Hematology

## 2015-05-10 ENCOUNTER — Other Ambulatory Visit: Payer: Medicare Other

## 2015-05-10 ENCOUNTER — Telehealth: Payer: Self-pay

## 2015-05-10 VITALS — BP 120/60 | HR 90 | Temp 98.3°F | Resp 18

## 2015-05-10 DIAGNOSIS — Z0189 Encounter for other specified special examinations: Secondary | ICD-10-CM | POA: Insufficient documentation

## 2015-05-10 DIAGNOSIS — C9200 Acute myeloblastic leukemia, not having achieved remission: Secondary | ICD-10-CM | POA: Diagnosis not present

## 2015-05-10 MED ORDER — SODIUM CHLORIDE 0.9 % IV SOLN: 250.0000 mL | Freq: Once | INTRAVENOUS | Status: DC

## 2015-05-10 MED ORDER — HEPARIN SOD (PORK) LOCK FLUSH 100 UNIT/ML IV SOLN: 250.0000 [IU] | INTRAVENOUS | Status: DC | PRN

## 2015-05-10 MED ORDER — SODIUM CHLORIDE 0.9 % IJ SOLN
10.0000 mL | INTRAMUSCULAR | Status: AC | PRN
  Administered 2015-05-10: 10 mL

## 2015-05-10 MED ORDER — SODIUM CHLORIDE 0.9 % IJ SOLN: 3.0000 mL | INTRAMUSCULAR | Status: DC | PRN

## 2015-05-10 MED ORDER — HEPARIN SOD (PORK) LOCK FLUSH 100 UNIT/ML IV SOLN
500.0000 [IU] | Freq: Every day | INTRAVENOUS | Status: AC | PRN
  Administered 2015-05-10: 500 [IU]
  Filled 2015-05-10: qty 5

## 2015-05-10 NOTE — Procedures (Signed)
[facility]  Procedure Note  Miranda Mcguire TTC:[id number] DOB: Feb 29, 1946 DOA: 05/10/2015   PCP: Laurey Morale, MD   Associated Diagnosis: Acute myeloid leukemia not having achieved remission ([place]) (205.00)  Procedure Note: Transfusion of 1 unit platelets   Condition During Procedure:  Pt tolerated well   Condition at Discharge:   No complications noted   Nigel Sloop, [facility]

## 2015-05-10 NOTE — Telephone Encounter (Signed)
Pt called at 1450 and LVM stating she had platelets at [facility] this am and she has chills. Called pt back at present and the chills are gone. She had a cup of hot tea. [facility] told her not to take tylenol. She takes an antihistimine every morning and did not feel she should take another. She has no fever, she has no respiratory distress, no tight chest nor SOB. Her stomach was a little upset. This was the first time she has had any problems after blood. She had no premeds for the platelets. The antihistamine she uses is [employer] brand Aller-tec 10mg . Upon research this is Cetirizine Hcl. She has another appt on 11/7 with possible blood transfusion. Pt stated she really did not want premedication for the blood.

## 2015-05-10 NOTE — Telephone Encounter (Signed)
Spoke with pt. Pt was quite upset that she had to wait  2 hours for platelets to arrive and to be transfused.  Pt accepted apology from nurse and just wanted to vent her frustration. Pt thanked nurse for the call back.

## 2015-05-10 NOTE — Telephone Encounter (Signed)
"  I have been waiting at [facility] since 0900 for platelets.   I want it on record that I am upset.  If you knew yesterday that I needed platelets today this should have been ordered and taken care of.  The platelets are having to come from [facility].  I need a call from one of Dr. Ernestina Penna nurses." Advised that platelets and blood are requested and sometimes come as far away as [facility] depending on what is needed on what day.

## 2015-05-11 LAB — PREPARE PLATELET PHERESIS: Unit division: 0

## 2015-05-14 ENCOUNTER — Other Ambulatory Visit (HOSPITAL_BASED_OUTPATIENT_CLINIC_OR_DEPARTMENT_OTHER): Payer: Medicare Other

## 2015-05-14 ENCOUNTER — Telehealth: Payer: Self-pay | Admitting: *Deleted

## 2015-05-14 ENCOUNTER — Ambulatory Visit (HOSPITAL_BASED_OUTPATIENT_CLINIC_OR_DEPARTMENT_OTHER): Payer: Medicare Other

## 2015-05-14 ENCOUNTER — Ambulatory Visit (HOSPITAL_COMMUNITY)
Admission: RE | Admit: 2015-05-14 | Discharge: 2015-05-14 | Disposition: A | Discharge status: Home / Self Care | Payer: Medicare Other | Source: Ambulatory Visit | Attending: Hematology | Admitting: Hematology

## 2015-05-14 ENCOUNTER — Other Ambulatory Visit: Payer: Self-pay | Admitting: *Deleted

## 2015-05-14 ENCOUNTER — Other Ambulatory Visit: Payer: Self-pay | Admitting: Hematology

## 2015-05-14 VITALS — BP 138/54 | HR 91 | Temp 98.1°F | Resp 20

## 2015-05-14 DIAGNOSIS — C9200 Acute myeloblastic leukemia, not having achieved remission: Secondary | ICD-10-CM

## 2015-05-14 DIAGNOSIS — Z0189 Encounter for other specified special examinations: Secondary | ICD-10-CM | POA: Diagnosis not present

## 2015-05-14 DIAGNOSIS — Z452 Encounter for adjustment and management of vascular access device: Secondary | ICD-10-CM

## 2015-05-14 LAB — COMPREHENSIVE METABOLIC PANEL (CC13)
ALT: 15 U/L (ref 0–55)
AST: 10 U/L (ref 5–34)
Albumin: 3.1 g/dL — ABNORMAL LOW (ref 3.5–5.0)
Alkaline Phosphatase: 89 U/L (ref 40–150)
Anion Gap: 8 mEq/L (ref 3–11)
BILIRUBIN TOTAL: 0.44 mg/dL (ref 0.20–1.20)
BUN: 25.3 mg/dL (ref 7.0–26.0)
CHLORIDE: 108 meq/L (ref 98–109)
CO2: 24 meq/L (ref 22–29)
Calcium: 9.5 mg/dL (ref 8.4–10.4)
Creatinine: 1.2 mg/dL — ABNORMAL HIGH (ref 0.6–1.1)
EGFR: 47 mL/min/{1.73_m2} — AB (ref >90)
GLUCOSE: 146 mg/dL — AB (ref 70–140)
POTASSIUM: 4 meq/L (ref 3.5–5.1)
SODIUM: 141 meq/L (ref 136–145)
TOTAL PROTEIN: 6.5 g/dL (ref 6.4–8.3)

## 2015-05-14 LAB — CBC WITH DIFFERENTIAL/PLATELET
BASO%: 0.5 % (ref 0.0–2.0)
Basophils Absolute: 0 10*3/uL (ref 0.0–0.1)
EOS%: 0.5 % (ref 0.0–7.0)
Eosinophils Absolute: 0 10*3/uL (ref 0.0–0.5)
HCT: 26.2 % — ABNORMAL LOW (ref 34.8–46.6)
HGB: 9.2 g/dL — ABNORMAL LOW (ref 11.6–15.9)
LYMPH#: 0.6 10*3/uL — AB (ref 0.9–3.3)
LYMPH%: 28.8 % (ref 14.0–49.7)
MCH: 29.7 pg (ref 25.1–34.0)
MCHC: 35.1 g/dL (ref 31.5–36.0)
MCV: 84.5 fL (ref 79.5–101.0)
MONO#: 0.3 10*3/uL (ref 0.1–0.9)
MONO%: 14.9 % — AB (ref 0.0–14.0)
NEUT#: 1.2 10*3/uL — ABNORMAL LOW (ref 1.5–6.5)
NEUT%: 55.3 % (ref 38.4–76.8)
Platelets: 7 10*3/uL — CL (ref 145–400)
RBC: 3.1 10*6/uL — AB (ref 3.70–5.45)
RDW: 13.3 % (ref 11.2–14.5)
WBC: 2.1 10*3/uL — ABNORMAL LOW (ref 3.9–10.3)
nRBC: 0 % (ref 0–0)

## 2015-05-14 LAB — HOLD TUBE, BLOOD BANK

## 2015-05-14 LAB — MAGNESIUM (CC13): Magnesium: 2.1 mg/dl (ref 1.5–2.5)

## 2015-05-14 MED ORDER — SODIUM CHLORIDE 0.9 % IV SOLN: 250.0000 mL | Freq: Once | INTRAVENOUS | Status: DC

## 2015-05-14 MED ORDER — SODIUM CHLORIDE 0.9 % IJ SOLN
10.0000 mL | INTRAMUSCULAR | Status: AC | PRN
  Administered 2015-05-14: 10 mL

## 2015-05-14 MED ORDER — HEPARIN SOD (PORK) LOCK FLUSH 100 UNIT/ML IV SOLN
500.0000 [IU] | Freq: Every day | INTRAVENOUS | Status: AC | PRN
  Administered 2015-05-14: 500 [IU]
  Filled 2015-05-14: qty 5

## 2015-05-14 MED ORDER — SODIUM CHLORIDE 0.9 % IJ SOLN
3.0000 mL | INTRAMUSCULAR | Status: DC | PRN
Start: 2015-05-14 — End: 2015-05-15

## 2015-05-14 MED ORDER — HEPARIN SOD (PORK) LOCK FLUSH 100 UNIT/ML IV SOLN: 250.0000 [IU] | INTRAVENOUS | Status: DC | PRN

## 2015-05-14 MED ORDER — SODIUM CHLORIDE 0.9 % IV SOLN
250.0000 mL | Freq: Once | INTRAVENOUS | Status: DC
Start: 2015-05-14 — End: 2015-05-15

## 2015-05-14 MED ORDER — SODIUM CHLORIDE 0.9 % IJ SOLN
10.0000 mL | Freq: Once | INTRAMUSCULAR | Status: AC
  Administered 2015-05-14: 10 mL
  Filled 2015-05-14: qty 10

## 2015-05-14 NOTE — Procedures (Signed)
[facility]  Procedure Note  Kyana Aicher WSB:[id number] DOB: 12/13/1945 DOA: 05/14/2015   PCP: Laurey Morale, MD   Associated Diagnosis: Acute myeloid leukemia not having achieved remission ([place]) (205.00)  Procedure Note: Transfusion of 1 unit platelets   Condition During Procedure:  Pt tolerated well; denies chills, sob   Condition at Discharge:  No complications noted   Nigel Sloop, [facility]

## 2015-05-14 NOTE — Telephone Encounter (Signed)
Faxed copy of lab from today to Dr Ellis/[facility] @ [phone]

## 2015-05-15 LAB — PREPARE PLATELET PHERESIS: UNIT DIVISION: 0

## 2015-05-15 NOTE — Progress Notes (Signed)
done

## 2015-05-16 ENCOUNTER — Telehealth: Payer: Self-pay | Admitting: Family Medicine

## 2015-05-16 NOTE — Telephone Encounter (Signed)
Pt states she and her husband came in last year for their physicals together. Pt has cpe on 07/04/14. There are not two available cpe appts either 12/29 or 12/30.  Is it ok to schedule them together either of these two days?

## 2015-05-16 NOTE — Telephone Encounter (Signed)
Please arrange for them to come together, thanks

## 2015-05-17 ENCOUNTER — Ambulatory Visit: Payer: Medicare Other

## 2015-05-17 ENCOUNTER — Other Ambulatory Visit: Payer: Self-pay | Admitting: Neurology

## 2015-05-17 ENCOUNTER — Other Ambulatory Visit: Payer: Medicare Other

## 2015-05-17 ENCOUNTER — Other Ambulatory Visit (HOSPITAL_BASED_OUTPATIENT_CLINIC_OR_DEPARTMENT_OTHER): Payer: Medicare Other

## 2015-05-17 ENCOUNTER — Telehealth: Payer: Self-pay | Admitting: *Deleted

## 2015-05-17 ENCOUNTER — Ambulatory Visit (HOSPITAL_BASED_OUTPATIENT_CLINIC_OR_DEPARTMENT_OTHER): Payer: Medicare Other

## 2015-05-17 ENCOUNTER — Other Ambulatory Visit: Payer: Medicare Other | Admitting: *Deleted

## 2015-05-17 VITALS — BP 129/54 | HR 99 | Temp 98.0°F | Resp 16

## 2015-05-17 DIAGNOSIS — D696 Thrombocytopenia, unspecified: Secondary | ICD-10-CM

## 2015-05-17 DIAGNOSIS — C9200 Acute myeloblastic leukemia, not having achieved remission: Secondary | ICD-10-CM

## 2015-05-17 DIAGNOSIS — Z0189 Encounter for other specified special examinations: Secondary | ICD-10-CM | POA: Diagnosis not present

## 2015-05-17 LAB — COMPREHENSIVE METABOLIC PANEL (CC13)
ALBUMIN: 3.2 g/dL — AB (ref 3.5–5.0)
ALK PHOS: 111 U/L (ref 40–150)
ALT: 14 U/L (ref 0–55)
ANION GAP: 9 meq/L (ref 3–11)
AST: 11 U/L (ref 5–34)
BUN: 20.8 mg/dL (ref 7.0–26.0)
CALCIUM: 9.5 mg/dL (ref 8.4–10.4)
CHLORIDE: 108 meq/L (ref 98–109)
CO2: 26 mEq/L (ref 22–29)
Creatinine: 1.2 mg/dL — ABNORMAL HIGH (ref 0.6–1.1)
EGFR: 47 mL/min/{1.73_m2} — AB (ref >90)
Glucose: 131 mg/dl (ref 70–140)
POTASSIUM: 4.3 meq/L (ref 3.5–5.1)
SODIUM: 142 meq/L (ref 136–145)
Total Bilirubin: 0.36 mg/dL (ref 0.20–1.20)
Total Protein: 6.4 g/dL (ref 6.4–8.3)

## 2015-05-17 LAB — CBC WITH DIFFERENTIAL/PLATELET
BASO%: 0.2 % (ref 0.0–2.0)
Basophils Absolute: 0 10*3/uL (ref 0.0–0.1)
EOS%: 0.2 % (ref 0.0–7.0)
Eosinophils Absolute: 0 10*3/uL (ref 0.0–0.5)
HEMATOCRIT: 26.2 % — AB (ref 34.8–46.6)
HEMOGLOBIN: 9.1 g/dL — AB (ref 11.6–15.9)
LYMPH#: 0.9 10*3/uL (ref 0.9–3.3)
LYMPH%: 18.9 % (ref 14.0–49.7)
MCH: 29.4 pg (ref 25.1–34.0)
MCHC: 34.7 g/dL (ref 31.5–36.0)
MCV: 84.5 fL (ref 79.5–101.0)
MONO#: 0.8 10*3/uL (ref 0.1–0.9)
MONO%: 18.1 % — ABNORMAL HIGH (ref 0.0–14.0)
NEUT%: 62.6 % (ref 38.4–76.8)
NEUTROS ABS: 2.9 10*3/uL (ref 1.5–6.5)
Platelets: 7 10*3/uL — CL (ref 145–400)
RBC: 3.1 10*6/uL — ABNORMAL LOW (ref 3.70–5.45)
RDW: 13.2 % (ref 11.2–14.5)
WBC: 4.7 10*3/uL (ref 3.9–10.3)
nRBC: 0 % (ref 0–0)

## 2015-05-17 LAB — MAGNESIUM (CC13): MAGNESIUM: 2 mg/dL (ref 1.5–2.5)

## 2015-05-17 LAB — HOLD TUBE, BLOOD BANK

## 2015-05-17 MED ORDER — SODIUM CHLORIDE 0.9 % IJ SOLN
10.0000 mL | INTRAMUSCULAR | Status: AC | PRN
  Administered 2015-05-17: 10 mL
  Filled 2015-05-17: qty 10

## 2015-05-17 MED ORDER — SODIUM CHLORIDE 0.9 % IV SOLN
250.0000 mL | Freq: Once | INTRAVENOUS | Status: AC
  Administered 2015-05-17: 250 mL via INTRAVENOUS

## 2015-05-17 MED ORDER — SODIUM CHLORIDE 0.9 % IJ SOLN
10.0000 mL | Freq: Once | INTRAMUSCULAR | Status: AC
  Administered 2015-05-17: 10 mL via INTRAVENOUS
  Filled 2015-05-17: qty 10

## 2015-05-17 MED ORDER — HEPARIN SOD (PORK) LOCK FLUSH 100 UNIT/ML IV SOLN
500.0000 [IU] | Freq: Every day | INTRAVENOUS | Status: AC | PRN
  Administered 2015-05-17: 500 [IU]
  Filled 2015-05-17: qty 5

## 2015-05-17 NOTE — Patient Instructions (Signed)

## 2015-05-17 NOTE — Patient Instructions (Signed)
Platelet Transfusion  A platelet transfusion is a procedure in which you receive donated platelets through an IV tube. Platelets are tiny pieces of blood cells. When a blood vessel is damaged, platelets collect in the damaged area to help form a blood clot. This begins the healing process. If your platelet count gets too low, your blood may have trouble clotting.  You may need a platelet transfusion if you have a condition that causes a low number of platelets (thrombocytopenia). A platelet transfusion may be used to stop or prevent bleeding.  LET YOUR HEALTH CARE PROVIDER KNOW ABOUT:   Any allergies you have.   All medicines you are taking, including vitamins, herbs, eye drops, creams, and over-the-counter medicines.   Previous problems you or members of your family have had with the use of anesthetics.   Any blood disorders you have.   Previous surgeries you have had.   Any medical conditions you may have.   Any reactions you have had during a previous transfusion. RISKS AND COMPLICATIONS Generally, this is a safe procedure. However, problems may occur, including:   Fever with or without chills. The fever usually occurs within the first 4 hours of the transfusion and returns to normal within 48 hours.  Allergic reaction. The reaction is most commonly caused by antibodies your body creates against substances in the transfusion. Signs of an allergic reaction may include itching, hives, difficulty breathing, shock, or low blood pressure.  Sudden (acute) or delayed hemolytic reaction. This rare reaction can occur during the transfusion and up to 28 days after the transfusion. The reaction usually occurs when your body's defense system (immune system) attacks the new platelets. Signs of a hemolytic reaction may include fever, headache, difficulty breathing, low blood pressure, a rapid heartbeat, or pain in your back, abdomen, chest, or IV site.  Transfusion-related acute lung injury  (TRALI). TRALI can occur within hours of a transfusion, or several days later. This is a rare reaction that causes lung damage. The cause is not known.  Infection. Signs of this rare complication may include fever, chills, vomiting, a rapid heartbeat, or low blood pressure. BEFORE THE PROCEDURE   You may have a blood test to determine your blood type. This is necessary to find out what kind ofplatelets best matches your platelets.  If you have had an allergic reaction to a transfusion in the past, you may be given medicine to help prevent a reaction. Take this medicine only as directed by your health care provider.  Your temperature, blood pressure, and pulse will be monitored before the transfusion. PROCEDURE  An IV will be started in your hand or arm.  The transfusion will be attached to your IV tubing. The bag of donated platelets will be attached to your IV tube andgiven into your vein.  Your temperature, blood pressure, and pulse will be monitored regularly during the transfusion. This monitoring is done to help detect early signs of a transfusion reaction.  If you have any signs or symptoms of a reaction, your transfusion will be stopped and you may be given medicine.  When your transfusion is complete, your IV will be removed.  Pressure may be applied to the IV site for a few minutes.  A bandage (dressing) will be applied. The procedure may vary among health care providers and hospitals. AFTER THE PROCEDURE  Your blood pressure, temperature, and pulse will be monitored regularly.   This information is not intended to replace advice given to you by your health   care provider. Make sure you discuss any questions you have with your health care provider.   Document Released: 04/20/2007 Document Revised: 07/14/2014 Document Reviewed: 05/03/2014 Elsevier Interactive Patient Education 2016 Elsevier Inc.  

## 2015-05-17 NOTE — Telephone Encounter (Signed)
Per staff message and POF I have scheduled appts. Advised scheduler of appts and to move labs. JMW  

## 2015-05-17 NOTE — Telephone Encounter (Signed)
Pt and spouse has been scheduled 

## 2015-05-18 ENCOUNTER — Other Ambulatory Visit: Payer: Self-pay | Admitting: *Deleted

## 2015-05-18 ENCOUNTER — Telehealth: Payer: Self-pay | Admitting: Hematology and Oncology

## 2015-05-18 ENCOUNTER — Telehealth: Payer: Self-pay | Admitting: Hematology

## 2015-05-18 LAB — PREPARE PLATELET PHERESIS: Unit division: 0

## 2015-05-18 NOTE — Telephone Encounter (Signed)
Appointments made and avs printed for patient °

## 2015-05-18 NOTE — Telephone Encounter (Signed)
Spoke with patient and she is aware of her 11/14 apointments

## 2015-05-21 ENCOUNTER — Ambulatory Visit (HOSPITAL_BASED_OUTPATIENT_CLINIC_OR_DEPARTMENT_OTHER): Payer: Medicare Other

## 2015-05-21 ENCOUNTER — Other Ambulatory Visit: Payer: Medicare Other

## 2015-05-21 ENCOUNTER — Other Ambulatory Visit: Payer: Self-pay | Admitting: *Deleted

## 2015-05-21 ENCOUNTER — Other Ambulatory Visit (HOSPITAL_BASED_OUTPATIENT_CLINIC_OR_DEPARTMENT_OTHER): Payer: Medicare Other

## 2015-05-21 ENCOUNTER — Ambulatory Visit: Payer: Medicare Other

## 2015-05-21 VITALS — BP 137/60 | HR 96 | Temp 98.2°F | Resp 16

## 2015-05-21 DIAGNOSIS — C9200 Acute myeloblastic leukemia, not having achieved remission: Secondary | ICD-10-CM | POA: Diagnosis not present

## 2015-05-21 DIAGNOSIS — Z95828 Presence of other vascular implants and grafts: Secondary | ICD-10-CM

## 2015-05-21 DIAGNOSIS — Z0189 Encounter for other specified special examinations: Secondary | ICD-10-CM | POA: Diagnosis not present

## 2015-05-21 LAB — COMPREHENSIVE METABOLIC PANEL (CC13)
ALBUMIN: 2.9 g/dL — AB (ref 3.5–5.0)
ALK PHOS: 90 U/L (ref 40–150)
ALT: 12 U/L (ref 0–55)
AST: 11 U/L (ref 5–34)
Anion Gap: 9 mEq/L (ref 3–11)
BILIRUBIN TOTAL: 0.3 mg/dL (ref 0.20–1.20)
BUN: 18.9 mg/dL (ref 7.0–26.0)
CO2: 25 mEq/L (ref 22–29)
Calcium: 9.3 mg/dL (ref 8.4–10.4)
Chloride: 108 mEq/L (ref 98–109)
Creatinine: 1 mg/dL (ref 0.6–1.1)
EGFR: 55 mL/min/{1.73_m2} — AB (ref >90)
GLUCOSE: 124 mg/dL (ref 70–140)
Potassium: 4 mEq/L (ref 3.5–5.1)
SODIUM: 143 meq/L (ref 136–145)
Total Protein: 6.4 g/dL (ref 6.4–8.3)

## 2015-05-21 LAB — CBC WITH DIFFERENTIAL/PLATELET
BASO%: 0 % (ref 0.0–2.0)
BASOS ABS: 0 10*3/uL (ref 0.0–0.1)
EOS%: 0.2 % (ref 0.0–7.0)
Eosinophils Absolute: 0 10*3/uL (ref 0.0–0.5)
HEMATOCRIT: 22.9 % — AB (ref 34.8–46.6)
HGB: 8.1 g/dL — ABNORMAL LOW (ref 11.6–15.9)
LYMPH%: 15.9 % (ref 14.0–49.7)
MCH: 29.8 pg (ref 25.1–34.0)
MCHC: 35.4 g/dL (ref 31.5–36.0)
MCV: 84.2 fL (ref 79.5–101.0)
MONO#: 0.9 10*3/uL (ref 0.1–0.9)
MONO%: 19.4 % — AB (ref 0.0–14.0)
NEUT#: 3 10*3/uL (ref 1.5–6.5)
NEUT%: 64.5 % (ref 38.4–76.8)
Platelets: 24 10*3/uL — ABNORMAL LOW (ref 145–400)
RBC: 2.72 10*6/uL — AB (ref 3.70–5.45)
RDW: 13.1 % (ref 11.2–14.5)
WBC: 4.6 10*3/uL (ref 3.9–10.3)
lymph#: 0.7 10*3/uL — ABNORMAL LOW (ref 0.9–3.3)

## 2015-05-21 LAB — MAGNESIUM (CC13): Magnesium: 2 mg/dl (ref 1.5–2.5)

## 2015-05-21 LAB — HOLD TUBE, BLOOD BANK

## 2015-05-21 LAB — PREPARE RBC (CROSSMATCH)

## 2015-05-21 MED ORDER — SODIUM CHLORIDE 0.9 % IV SOLN
250.0000 mL | Freq: Once | INTRAVENOUS | Status: AC
  Administered 2015-05-21: 250 mL via INTRAVENOUS

## 2015-05-21 MED ORDER — HEPARIN SOD (PORK) LOCK FLUSH 100 UNIT/ML IV SOLN
500.0000 [IU] | Freq: Every day | INTRAVENOUS | Status: AC | PRN
  Administered 2015-05-21: 500 [IU]
  Filled 2015-05-21: qty 5

## 2015-05-21 MED ORDER — SODIUM CHLORIDE 0.9 % IJ SOLN
10.0000 mL | INTRAMUSCULAR | Status: DC | PRN
  Administered 2015-05-21: 10 mL via INTRAVENOUS
  Filled 2015-05-21: qty 10

## 2015-05-21 MED ORDER — SODIUM CHLORIDE 0.9 % IJ SOLN
10.0000 mL | INTRAMUSCULAR | Status: AC | PRN
  Administered 2015-05-21: 10 mL
  Filled 2015-05-21: qty 10

## 2015-05-21 NOTE — Patient Instructions (Signed)

## 2015-05-22 LAB — TYPE AND SCREEN
ABO/RH(D): A POS
ANTIBODY SCREEN: NEGATIVE
UNIT DIVISION: 0
UNIT DIVISION: 0

## 2015-05-24 ENCOUNTER — Other Ambulatory Visit (HOSPITAL_BASED_OUTPATIENT_CLINIC_OR_DEPARTMENT_OTHER): Payer: Medicare Other

## 2015-05-24 ENCOUNTER — Ambulatory Visit (HOSPITAL_BASED_OUTPATIENT_CLINIC_OR_DEPARTMENT_OTHER): Payer: Medicare Other

## 2015-05-24 ENCOUNTER — Other Ambulatory Visit: Payer: Self-pay | Admitting: Hematology

## 2015-05-24 VITALS — BP 124/62 | HR 85 | Temp 97.6°F | Resp 16

## 2015-05-24 DIAGNOSIS — Z452 Encounter for adjustment and management of vascular access device: Secondary | ICD-10-CM

## 2015-05-24 DIAGNOSIS — C9200 Acute myeloblastic leukemia, not having achieved remission: Secondary | ICD-10-CM

## 2015-05-24 DIAGNOSIS — C9201 Acute myeloblastic leukemia, in remission: Secondary | ICD-10-CM

## 2015-05-24 DIAGNOSIS — Z95828 Presence of other vascular implants and grafts: Secondary | ICD-10-CM

## 2015-05-24 LAB — CBC WITH DIFFERENTIAL/PLATELET
BASO%: 0 % (ref 0.0–2.0)
Basophils Absolute: 0 10*3/uL (ref 0.0–0.1)
EOS%: 0 % (ref 0.0–7.0)
Eosinophils Absolute: 0 10*3/uL (ref 0.0–0.5)
HCT: 32.1 % — ABNORMAL LOW (ref 34.8–46.6)
HGB: 11.4 g/dL — ABNORMAL LOW (ref 11.6–15.9)
LYMPH%: 13.7 % — AB (ref 14.0–49.7)
MCH: 30 pg (ref 25.1–34.0)
MCHC: 35.5 g/dL (ref 31.5–36.0)
MCV: 84.5 fL (ref 79.5–101.0)
MONO#: 0.7 10*3/uL (ref 0.1–0.9)
MONO%: 15.2 % — ABNORMAL HIGH (ref 0.0–14.0)
NEUT%: 71.1 % (ref 38.4–76.8)
NEUTROS ABS: 3.4 10*3/uL (ref 1.5–6.5)
PLATELETS: 17 10*3/uL — AB (ref 145–400)
RBC: 3.8 10*6/uL (ref 3.70–5.45)
RDW: 13.1 % (ref 11.2–14.5)
WBC: 4.7 10*3/uL (ref 3.9–10.3)
lymph#: 0.7 10*3/uL — ABNORMAL LOW (ref 0.9–3.3)

## 2015-05-24 LAB — COMPREHENSIVE METABOLIC PANEL (CC13)
ALT: 12 U/L (ref 0–55)
ANION GAP: 10 meq/L (ref 3–11)
AST: 12 U/L (ref 5–34)
Albumin: 3.1 g/dL — ABNORMAL LOW (ref 3.5–5.0)
Alkaline Phosphatase: 90 U/L (ref 40–150)
BILIRUBIN TOTAL: 0.37 mg/dL (ref 0.20–1.20)
BUN: 21.1 mg/dL (ref 7.0–26.0)
CO2: 24 meq/L (ref 22–29)
CREATININE: 1.1 mg/dL (ref 0.6–1.1)
Calcium: 9.3 mg/dL (ref 8.4–10.4)
Chloride: 108 mEq/L (ref 98–109)
EGFR: 50 mL/min/{1.73_m2} — ABNORMAL LOW (ref >90)
GLUCOSE: 130 mg/dL (ref 70–140)
Potassium: 4 mEq/L (ref 3.5–5.1)
SODIUM: 142 meq/L (ref 136–145)
TOTAL PROTEIN: 6.5 g/dL (ref 6.4–8.3)

## 2015-05-24 LAB — MAGNESIUM (CC13): Magnesium: 1.9 mg/dl (ref 1.5–2.5)

## 2015-05-24 MED ORDER — SODIUM CHLORIDE 0.9 % IJ SOLN
10.0000 mL | INTRAMUSCULAR | Status: DC | PRN
  Administered 2015-05-24: 10 mL via INTRAVENOUS
  Filled 2015-05-24: qty 10

## 2015-05-24 MED ORDER — HEPARIN SOD (PORK) LOCK FLUSH 100 UNIT/ML IV SOLN
500.0000 [IU] | Freq: Once | INTRAVENOUS | Status: DC
  Administered 2015-05-24: 500 [IU] via INTRAVENOUS
  Filled 2015-05-24: qty 5

## 2015-05-24 NOTE — Patient Instructions (Signed)

## 2015-05-24 NOTE — Patient Instructions (Signed)
Thrombocytopenia °Thrombocytopenia is a condition in which there is an abnormally small number of platelets in your blood. Platelets are also called thrombocytes. Platelets are needed for blood clotting. °CAUSES °Thrombocytopenia is caused by:  °· Decreased production of platelets. This can be caused by: °¨ Aplastic anemia in which your bone marrow quits making blood cells. °¨ Cancer in the bone marrow. °¨ Use of certain medicines, including chemotherapy. °¨ Infection in the bone marrow. °¨ Heavy alcohol consumption. °· Increased destruction of platelets. This can be caused by: °¨ Certain immune diseases. °¨ Use of certain drugs. °¨ Certain blood clotting disorders. °¨ Certain inherited disorders. °¨ Certain bleeding disorders. °¨ Pregnancy. °· Having an enlarged spleen (hypersplenism). In hypersplenism, the spleen gathers up platelets from circulation. This means the platelets are not available to help with blood clotting. The spleen can enlarge due to cirrhosis or other conditions. °SYMPTOMS  °The symptoms of thrombocytopenia are side effects of poor blood clotting. Some of these are: °· Abnormal bleeding. °· Nosebleeds. °· Heavy menstrual periods. °· Blood in the urine or stools. °· Purpura. This is a purplish discoloration in the skin produced by small bleeding vessels near the surface of the skin. °· Bruising. °· A rash that may be petechial. This looks like pinpoint, purplish-red spots on the skin and mucous membranes. It is caused by bleeding from small blood vessels (capillaries). °DIAGNOSIS  °Your caregiver will make this diagnosis based on your exam and blood tests. Sometimes, a bone marrow study is done to look for the original cells (megakaryocytes) that make platelets. °TREATMENT  °Treatment depends on the cause of the condition. °· Medicines may be given to help protect your platelets from being destroyed. °· In some cases, a replacement (transfusion) of platelets may be required to stop or prevent  bleeding. °· Sometimes, the spleen must be surgically removed. °HOME CARE INSTRUCTIONS  °· Check the skin and linings inside your mouth for bruising or bleeding as directed by your caregiver. °· Check your sputum, urine, and stool for blood as directed by your caregiver. °· Do not return to any activities that could cause bumps or bruises until your caregiver says it is okay. °· Take extra care not to cut yourself when shaving or when using scissors, needles, knives, and other tools. °· Take extra care not to burn yourself when ironing or cooking. °· Ask your caregiver if it is okay for you to drink alcohol. °· Only take over-the-counter or prescription medicines as directed by your caregiver. °· Notify all your caregivers, including dentists and eye doctors, about your condition. °SEEK IMMEDIATE MEDICAL CARE IF:  °· You develop active bleeding from anywhere in your body. °· You develop unexplained bruising or bleeding. °· You have blood in your sputum, urine, or stool. °MAKE SURE YOU: °· Understand these instructions. °· Will watch your condition. °· Will get help right away if you are not doing well or get worse. °  °This information is not intended to replace advice given to you by your health care provider. Make sure you discuss any questions you have with your health care provider. °  °Document Released: 06/23/2005 Document Revised: 09/15/2011 Document Reviewed: 12/25/2014 °Elsevier Interactive Patient Education ©2016 Elsevier Inc. ° °

## 2015-05-25 LAB — PREPARE PLATELET PHERESIS: Unit division: 0

## 2015-05-28 ENCOUNTER — Ambulatory Visit (HOSPITAL_BASED_OUTPATIENT_CLINIC_OR_DEPARTMENT_OTHER): Payer: Medicare Other

## 2015-05-28 ENCOUNTER — Other Ambulatory Visit (HOSPITAL_BASED_OUTPATIENT_CLINIC_OR_DEPARTMENT_OTHER): Payer: Medicare Other

## 2015-05-28 DIAGNOSIS — C9201 Acute myeloblastic leukemia, in remission: Secondary | ICD-10-CM

## 2015-05-28 DIAGNOSIS — C9200 Acute myeloblastic leukemia, not having achieved remission: Secondary | ICD-10-CM | POA: Diagnosis present

## 2015-05-28 DIAGNOSIS — Z452 Encounter for adjustment and management of vascular access device: Secondary | ICD-10-CM | POA: Diagnosis not present

## 2015-05-28 DIAGNOSIS — Z95828 Presence of other vascular implants and grafts: Secondary | ICD-10-CM

## 2015-05-28 LAB — CBC WITH DIFFERENTIAL/PLATELET
BASO%: 0.3 % (ref 0.0–2.0)
BASOS ABS: 0 10*3/uL (ref 0.0–0.1)
EOS ABS: 0 10*3/uL (ref 0.0–0.5)
EOS%: 0 % (ref 0.0–7.0)
HCT: 30.4 % — ABNORMAL LOW (ref 34.8–46.6)
HEMOGLOBIN: 10.4 g/dL — AB (ref 11.6–15.9)
LYMPH%: 15.5 % (ref 14.0–49.7)
MCH: 29.2 pg (ref 25.1–34.0)
MCHC: 34.2 g/dL (ref 31.5–36.0)
MCV: 85.6 fL (ref 79.5–101.0)
MONO#: 0.7 10*3/uL (ref 0.1–0.9)
MONO%: 15.7 % — AB (ref 0.0–14.0)
NEUT#: 3 10*3/uL (ref 1.5–6.5)
NEUT%: 68.5 % (ref 38.4–76.8)
Platelets: 42 10*3/uL — ABNORMAL LOW (ref 145–400)
RBC: 3.56 10*6/uL — ABNORMAL LOW (ref 3.70–5.45)
RDW: 13.1 % (ref 11.2–14.5)
WBC: 4.3 10*3/uL (ref 3.9–10.3)
lymph#: 0.7 10*3/uL — ABNORMAL LOW (ref 0.9–3.3)

## 2015-05-28 LAB — COMPREHENSIVE METABOLIC PANEL (CC13)
ALT: 20 U/L (ref 0–55)
ANION GAP: 8 meq/L (ref 3–11)
AST: 13 U/L (ref 5–34)
Albumin: 3.1 g/dL — ABNORMAL LOW (ref 3.5–5.0)
Alkaline Phosphatase: 81 U/L (ref 40–150)
BILIRUBIN TOTAL: 0.37 mg/dL (ref 0.20–1.20)
BUN: 19.5 mg/dL (ref 7.0–26.0)
CALCIUM: 9.1 mg/dL (ref 8.4–10.4)
CO2: 24 meq/L (ref 22–29)
Chloride: 111 mEq/L — ABNORMAL HIGH (ref 98–109)
Creatinine: 1 mg/dL (ref 0.6–1.1)
EGFR: 60 mL/min/{1.73_m2} — AB (ref >90)
GLUCOSE: 109 mg/dL (ref 70–140)
Potassium: 4.1 mEq/L (ref 3.5–5.1)
Sodium: 143 mEq/L (ref 136–145)
Total Protein: 6.1 g/dL — ABNORMAL LOW (ref 6.4–8.3)

## 2015-05-28 LAB — MAGNESIUM (CC13): Magnesium: 1.8 mg/dl (ref 1.5–2.5)

## 2015-05-28 LAB — HOLD TUBE, BLOOD BANK

## 2015-05-28 MED ORDER — SODIUM CHLORIDE 0.9 % IJ SOLN
10.0000 mL | INTRAMUSCULAR | Status: DC | PRN
  Administered 2015-05-28: 10 mL via INTRAVENOUS
  Filled 2015-05-28: qty 10

## 2015-05-28 MED ORDER — HEPARIN SOD (PORK) LOCK FLUSH 100 UNIT/ML IV SOLN
500.0000 [IU] | Freq: Once | INTRAVENOUS | Status: AC
Start: 2015-05-28 — End: 2015-05-28
  Administered 2015-05-28: 500 [IU] via INTRAVENOUS
  Filled 2015-05-28: qty 5

## 2015-05-28 NOTE — Addendum Note (Signed)
Addended by: Tyler Aas A on: 05/28/2015 09:48 AM   Modules accepted: Orders, SmartSet

## 2015-06-01 ENCOUNTER — Ambulatory Visit (HOSPITAL_BASED_OUTPATIENT_CLINIC_OR_DEPARTMENT_OTHER): Payer: Medicare Other

## 2015-06-01 ENCOUNTER — Other Ambulatory Visit (HOSPITAL_BASED_OUTPATIENT_CLINIC_OR_DEPARTMENT_OTHER): Payer: Medicare Other

## 2015-06-01 VITALS — BP 129/54 | HR 78 | Temp 97.8°F | Resp 18

## 2015-06-01 DIAGNOSIS — C9200 Acute myeloblastic leukemia, not having achieved remission: Secondary | ICD-10-CM | POA: Diagnosis not present

## 2015-06-01 LAB — CBC WITH DIFFERENTIAL/PLATELET
BASO%: 0.4 % (ref 0.0–2.0)
Basophils Absolute: 0 10*3/uL (ref 0.0–0.1)
EOS%: 0.1 % (ref 0.0–7.0)
Eosinophils Absolute: 0 10*3/uL (ref 0.0–0.5)
HEMATOCRIT: 28.2 % — AB (ref 34.8–46.6)
HEMOGLOBIN: 9.8 g/dL — AB (ref 11.6–15.9)
LYMPH#: 0.7 10*3/uL — AB (ref 0.9–3.3)
LYMPH%: 13.6 % — ABNORMAL LOW (ref 14.0–49.7)
MCH: 29.7 pg (ref 25.1–34.0)
MCHC: 34.9 g/dL (ref 31.5–36.0)
MCV: 85.1 fL (ref 79.5–101.0)
MONO#: 0.8 10*3/uL (ref 0.1–0.9)
MONO%: 15 % — AB (ref 0.0–14.0)
NEUT#: 3.7 10*3/uL (ref 1.5–6.5)
NEUT%: 70.9 % (ref 38.4–76.8)
Platelets: 46 10*3/uL — ABNORMAL LOW (ref 145–400)
RBC: 3.31 10*6/uL — ABNORMAL LOW (ref 3.70–5.45)
RDW: 13.6 % (ref 11.2–14.5)
WBC: 5.2 10*3/uL (ref 3.9–10.3)

## 2015-06-01 LAB — COMPREHENSIVE METABOLIC PANEL (CC13)
ALT: 22 U/L (ref 0–55)
AST: 15 U/L (ref 5–34)
Albumin: 3.1 g/dL — ABNORMAL LOW (ref 3.5–5.0)
Alkaline Phosphatase: 85 U/L (ref 40–150)
Anion Gap: 9 mEq/L (ref 3–11)
BUN: 24.1 mg/dL (ref 7.0–26.0)
CHLORIDE: 109 meq/L (ref 98–109)
CO2: 25 meq/L (ref 22–29)
CREATININE: 1 mg/dL (ref 0.6–1.1)
Calcium: 9.1 mg/dL (ref 8.4–10.4)
EGFR: 57 mL/min/{1.73_m2} — ABNORMAL LOW (ref >90)
GLUCOSE: 116 mg/dL (ref 70–140)
POTASSIUM: 4 meq/L (ref 3.5–5.1)
SODIUM: 143 meq/L (ref 136–145)
Total Bilirubin: 0.45 mg/dL (ref 0.20–1.20)
Total Protein: 6.1 g/dL — ABNORMAL LOW (ref 6.4–8.3)

## 2015-06-01 LAB — HOLD TUBE, BLOOD BANK

## 2015-06-01 LAB — MAGNESIUM (CC13): Magnesium: 1.9 mg/dl (ref 1.5–2.5)

## 2015-06-01 MED ORDER — HEPARIN SOD (PORK) LOCK FLUSH 100 UNIT/ML IV SOLN
500.0000 [IU] | Freq: Once | INTRAVENOUS | Status: AC
  Administered 2015-06-01: 500 [IU] via INTRAVENOUS
  Filled 2015-06-01: qty 5

## 2015-06-01 MED ORDER — SODIUM CHLORIDE 0.9 % IJ SOLN
10.0000 mL | INTRAMUSCULAR | Status: DC | PRN
  Administered 2015-06-01: 10 mL via INTRAVENOUS
  Filled 2015-06-01: qty 10

## 2015-06-04 ENCOUNTER — Ambulatory Visit (HOSPITAL_BASED_OUTPATIENT_CLINIC_OR_DEPARTMENT_OTHER): Payer: Medicare Other

## 2015-06-04 ENCOUNTER — Other Ambulatory Visit (HOSPITAL_BASED_OUTPATIENT_CLINIC_OR_DEPARTMENT_OTHER): Payer: Medicare Other

## 2015-06-04 DIAGNOSIS — C9200 Acute myeloblastic leukemia, not having achieved remission: Secondary | ICD-10-CM

## 2015-06-04 DIAGNOSIS — Z95828 Presence of other vascular implants and grafts: Secondary | ICD-10-CM

## 2015-06-04 LAB — CBC WITH DIFFERENTIAL/PLATELET
BASO%: 0.5 % (ref 0.0–2.0)
Basophils Absolute: 0 10*3/uL (ref 0.0–0.1)
EOS%: 0.1 % (ref 0.0–7.0)
Eosinophils Absolute: 0 10*3/uL (ref 0.0–0.5)
HEMATOCRIT: 34 % — AB (ref 34.8–46.6)
HGB: 11.6 g/dL (ref 11.6–15.9)
LYMPH#: 0.5 10*3/uL — AB (ref 0.9–3.3)
LYMPH%: 12.5 % — ABNORMAL LOW (ref 14.0–49.7)
MCH: 29.6 pg (ref 25.1–34.0)
MCHC: 34.2 g/dL (ref 31.5–36.0)
MCV: 86.4 fL (ref 79.5–101.0)
MONO#: 0.6 10*3/uL (ref 0.1–0.9)
MONO%: 13.9 % (ref 0.0–14.0)
NEUT%: 73 % (ref 38.4–76.8)
NEUTROS ABS: 3.1 10*3/uL (ref 1.5–6.5)
PLATELETS: 48 10*3/uL — AB (ref 145–400)
RBC: 3.93 10*6/uL (ref 3.70–5.45)
RDW: 13.6 % (ref 11.2–14.5)
WBC: 4.3 10*3/uL (ref 3.9–10.3)

## 2015-06-04 LAB — COMPREHENSIVE METABOLIC PANEL (CC13)
ALBUMIN: 3.1 g/dL — AB (ref 3.5–5.0)
ALT: 23 U/L (ref 0–55)
AST: 14 U/L (ref 5–34)
Alkaline Phosphatase: 79 U/L (ref 40–150)
Anion Gap: 7 mEq/L (ref 3–11)
BILIRUBIN TOTAL: 0.41 mg/dL (ref 0.20–1.20)
BUN: 17.8 mg/dL (ref 7.0–26.0)
CALCIUM: 9 mg/dL (ref 8.4–10.4)
CHLORIDE: 111 meq/L — AB (ref 98–109)
CO2: 24 mEq/L (ref 22–29)
CREATININE: 1 mg/dL (ref 0.6–1.1)
EGFR: 59 mL/min/{1.73_m2} — ABNORMAL LOW (ref >90)
GLUCOSE: 114 mg/dL (ref 70–140)
POTASSIUM: 4 meq/L (ref 3.5–5.1)
SODIUM: 142 meq/L (ref 136–145)
Total Protein: 6.3 g/dL — ABNORMAL LOW (ref 6.4–8.3)

## 2015-06-04 LAB — HOLD TUBE, BLOOD BANK

## 2015-06-04 LAB — MAGNESIUM (CC13): Magnesium: 1.9 mg/dl (ref 1.5–2.5)

## 2015-06-04 MED ORDER — SODIUM CHLORIDE 0.9 % IJ SOLN
10.0000 mL | INTRAMUSCULAR | Status: DC | PRN
  Administered 2015-06-04: 10 mL via INTRAVENOUS
  Filled 2015-06-04: qty 10

## 2015-06-04 MED ORDER — HEPARIN SOD (PORK) LOCK FLUSH 100 UNIT/ML IV SOLN
500.0000 [IU] | Freq: Once | INTRAVENOUS | Status: DC
  Filled 2015-06-04: qty 5

## 2015-06-04 NOTE — Patient Instructions (Signed)

## 2015-06-04 NOTE — Addendum Note (Signed)
Addended by: Jethro Bolus A on: 06/04/2015 09:52 AM   Modules accepted: Orders, SmartSet

## 2015-06-06 DIAGNOSIS — H348312 Tributary (branch) retinal vein occlusion, right eye, stable: Secondary | ICD-10-CM | POA: Diagnosis not present

## 2015-06-06 DIAGNOSIS — H40013 Open angle with borderline findings, low risk, bilateral: Secondary | ICD-10-CM | POA: Diagnosis not present

## 2015-06-06 DIAGNOSIS — H35041 Retinal micro-aneurysms, unspecified, right eye: Secondary | ICD-10-CM | POA: Diagnosis not present

## 2015-06-06 DIAGNOSIS — H3561 Retinal hemorrhage, right eye: Secondary | ICD-10-CM | POA: Diagnosis not present

## 2015-06-07 ENCOUNTER — Encounter: Payer: Self-pay | Admitting: Gastroenterology

## 2015-06-13 DIAGNOSIS — E8770 Fluid overload, unspecified: Secondary | ICD-10-CM | POA: Diagnosis not present

## 2015-06-13 DIAGNOSIS — E039 Hypothyroidism, unspecified: Secondary | ICD-10-CM | POA: Diagnosis not present

## 2015-06-13 DIAGNOSIS — N179 Acute kidney failure, unspecified: Secondary | ICD-10-CM | POA: Diagnosis not present

## 2015-06-13 DIAGNOSIS — J811 Chronic pulmonary edema: Secondary | ICD-10-CM | POA: Diagnosis not present

## 2015-06-13 DIAGNOSIS — E785 Hyperlipidemia, unspecified: Secondary | ICD-10-CM | POA: Diagnosis not present

## 2015-06-13 DIAGNOSIS — J90 Pleural effusion, not elsewhere classified: Secondary | ICD-10-CM | POA: Diagnosis not present

## 2015-06-13 DIAGNOSIS — Z79899 Other long term (current) drug therapy: Secondary | ICD-10-CM | POA: Diagnosis not present

## 2015-06-13 DIAGNOSIS — Z23 Encounter for immunization: Secondary | ICD-10-CM | POA: Diagnosis not present

## 2015-06-13 DIAGNOSIS — I10 Essential (primary) hypertension: Secondary | ICD-10-CM | POA: Diagnosis not present

## 2015-06-13 DIAGNOSIS — M549 Dorsalgia, unspecified: Secondary | ICD-10-CM | POA: Diagnosis not present

## 2015-06-13 DIAGNOSIS — C9201 Acute myeloblastic leukemia, in remission: Secondary | ICD-10-CM | POA: Diagnosis not present

## 2015-06-14 ENCOUNTER — Other Ambulatory Visit: Payer: Self-pay | Admitting: *Deleted

## 2015-06-14 ENCOUNTER — Encounter (INDEPENDENT_AMBULATORY_CARE_PROVIDER_SITE_OTHER): Payer: Medicare Other | Admitting: Ophthalmology

## 2015-06-14 DIAGNOSIS — I10 Essential (primary) hypertension: Secondary | ICD-10-CM

## 2015-06-14 DIAGNOSIS — H353132 Nonexudative age-related macular degeneration, bilateral, intermediate dry stage: Secondary | ICD-10-CM

## 2015-06-14 DIAGNOSIS — C9200 Acute myeloblastic leukemia, not having achieved remission: Secondary | ICD-10-CM

## 2015-06-14 DIAGNOSIS — H2513 Age-related nuclear cataract, bilateral: Secondary | ICD-10-CM | POA: Diagnosis not present

## 2015-06-14 DIAGNOSIS — H35033 Hypertensive retinopathy, bilateral: Secondary | ICD-10-CM

## 2015-06-14 DIAGNOSIS — H43813 Vitreous degeneration, bilateral: Secondary | ICD-10-CM | POA: Diagnosis not present

## 2015-06-15 ENCOUNTER — Telehealth: Payer: Self-pay | Admitting: Hematology

## 2015-06-15 ENCOUNTER — Other Ambulatory Visit: Payer: Self-pay

## 2015-06-15 DIAGNOSIS — Z1231 Encounter for screening mammogram for malignant neoplasm of breast: Secondary | ICD-10-CM

## 2015-06-15 NOTE — Telephone Encounter (Signed)
s.w. pt and advised on DEC and Jan appt.....pt ok and aware °

## 2015-06-18 ENCOUNTER — Ambulatory Visit
Admission: RE | Admit: 2015-06-18 | Discharge: 2015-06-18 | Disposition: A | Discharge status: Home / Self Care | Payer: Medicare Other | Source: Ambulatory Visit

## 2015-06-18 DIAGNOSIS — Z1231 Encounter for screening mammogram for malignant neoplasm of breast: Secondary | ICD-10-CM | POA: Diagnosis not present

## 2015-06-20 ENCOUNTER — Other Ambulatory Visit: Payer: Self-pay | Admitting: *Deleted

## 2015-06-20 ENCOUNTER — Ambulatory Visit: Payer: Medicare Other

## 2015-06-20 ENCOUNTER — Telehealth: Payer: Self-pay | Admitting: *Deleted

## 2015-06-20 ENCOUNTER — Other Ambulatory Visit (HOSPITAL_BASED_OUTPATIENT_CLINIC_OR_DEPARTMENT_OTHER): Payer: Medicare Other

## 2015-06-20 ENCOUNTER — Ambulatory Visit (HOSPITAL_BASED_OUTPATIENT_CLINIC_OR_DEPARTMENT_OTHER): Payer: Medicare Other

## 2015-06-20 ENCOUNTER — Encounter (HOSPITAL_COMMUNITY)
Admission: RE | Admit: 2015-06-20 | Discharge: 2015-06-20 | Disposition: A | Discharge status: Home / Self Care | Payer: Medicare Other | Source: Ambulatory Visit | Attending: Hematology | Admitting: Hematology

## 2015-06-20 VITALS — BP 119/52 | HR 87 | Temp 97.0°F | Resp 20

## 2015-06-20 DIAGNOSIS — C9201 Acute myeloblastic leukemia, in remission: Secondary | ICD-10-CM

## 2015-06-20 DIAGNOSIS — C9200 Acute myeloblastic leukemia, not having achieved remission: Secondary | ICD-10-CM

## 2015-06-20 DIAGNOSIS — Z95828 Presence of other vascular implants and grafts: Secondary | ICD-10-CM

## 2015-06-20 DIAGNOSIS — C9210 Chronic myeloid leukemia, BCR/ABL-positive, not having achieved remission: Secondary | ICD-10-CM

## 2015-06-20 DIAGNOSIS — Z452 Encounter for adjustment and management of vascular access device: Secondary | ICD-10-CM | POA: Diagnosis not present

## 2015-06-20 LAB — CBC WITH DIFFERENTIAL/PLATELET
BASO%: 0.6 % (ref 0.0–2.0)
BASOS ABS: 0 10*3/uL (ref 0.0–0.1)
EOS%: 1.6 % (ref 0.0–7.0)
Eosinophils Absolute: 0.1 10*3/uL (ref 0.0–0.5)
HCT: 27 % — ABNORMAL LOW (ref 34.8–46.6)
HEMOGLOBIN: 8.8 g/dL — AB (ref 11.6–15.9)
LYMPH%: 11.7 % — ABNORMAL LOW (ref 14.0–49.7)
MCH: 30.2 pg (ref 25.1–34.0)
MCHC: 32.8 g/dL (ref 31.5–36.0)
MCV: 92.1 fL (ref 79.5–101.0)
MONO#: 0.5 10*3/uL (ref 0.1–0.9)
MONO%: 8.7 % (ref 0.0–14.0)
NEUT%: 77.4 % — ABNORMAL HIGH (ref 38.4–76.8)
NEUTROS ABS: 4.7 10*3/uL (ref 1.5–6.5)
Platelets: 72 10*3/uL — ABNORMAL LOW (ref 145–400)
RBC: 2.93 10*6/uL — ABNORMAL LOW (ref 3.70–5.45)
RDW: 19.9 % — AB (ref 11.2–14.5)
WBC: 6 10*3/uL (ref 3.9–10.3)
lymph#: 0.7 10*3/uL — ABNORMAL LOW (ref 0.9–3.3)

## 2015-06-20 LAB — COMPREHENSIVE METABOLIC PANEL
ALBUMIN: 3.3 g/dL — AB (ref 3.5–5.0)
ALK PHOS: 88 U/L (ref 40–150)
ALT: 24 U/L (ref 0–55)
ANION GAP: 7 meq/L (ref 3–11)
AST: 15 U/L (ref 5–34)
BUN: 19.8 mg/dL (ref 7.0–26.0)
CALCIUM: 9 mg/dL (ref 8.4–10.4)
CO2: 27 mEq/L (ref 22–29)
Chloride: 109 mEq/L (ref 98–109)
Creatinine: 1.1 mg/dL (ref 0.6–1.1)
EGFR: 52 mL/min/{1.73_m2} — AB (ref >90)
Glucose: 133 mg/dl (ref 70–140)
POTASSIUM: 3.9 meq/L (ref 3.5–5.1)
Sodium: 143 mEq/L (ref 136–145)
Total Bilirubin: 0.6 mg/dL (ref 0.20–1.20)
Total Protein: 6.4 g/dL (ref 6.4–8.3)

## 2015-06-20 LAB — PREPARE RBC (CROSSMATCH)

## 2015-06-20 LAB — MAGNESIUM: MAGNESIUM: 2 mg/dL (ref 1.5–2.5)

## 2015-06-20 LAB — HOLD TUBE, BLOOD BANK

## 2015-06-20 MED ORDER — HEPARIN SOD (PORK) LOCK FLUSH 100 UNIT/ML IV SOLN
500.0000 [IU] | Freq: Once | INTRAVENOUS | Status: DC
Start: 2015-06-20 — End: 2015-06-20
  Filled 2015-06-20: qty 5

## 2015-06-20 MED ORDER — SODIUM CHLORIDE 0.9 % IJ SOLN
10.0000 mL | Freq: Once | INTRAMUSCULAR | Status: AC
  Administered 2015-06-20: 10 mL via INTRAVENOUS
  Filled 2015-06-20: qty 10

## 2015-06-20 MED ORDER — HEPARIN SOD (PORK) LOCK FLUSH 100 UNIT/ML IV SOLN
500.0000 [IU] | Freq: Once | INTRAVENOUS | Status: AC
  Administered 2015-06-20: 500 [IU] via INTRAVENOUS
  Filled 2015-06-20: qty 5

## 2015-06-20 MED ORDER — SODIUM CHLORIDE 0.9 % IJ SOLN
10.0000 mL | INTRAMUSCULAR | Status: DC | PRN
  Administered 2015-06-20: 10 mL via INTRAVENOUS
  Filled 2015-06-20: qty 10

## 2015-06-20 NOTE — Patient Instructions (Signed)

## 2015-06-20 NOTE — Telephone Encounter (Signed)
Labs faxed to [facility] @ [phone] Lissa Merlin

## 2015-06-20 NOTE — Patient Instructions (Signed)

## 2015-06-21 ENCOUNTER — Telehealth: Payer: Self-pay | Admitting: Hematology

## 2015-06-21 ENCOUNTER — Telehealth: Payer: Self-pay | Admitting: *Deleted

## 2015-06-21 ENCOUNTER — Other Ambulatory Visit: Payer: Self-pay | Admitting: Family Medicine

## 2015-06-21 NOTE — Telephone Encounter (Signed)
per pf to sch pt trmt-sent email to MW to sch pt trmt-willc all pt after reply

## 2015-06-21 NOTE — Telephone Encounter (Signed)
cld pt and left message of next appt 1/18 time & date

## 2015-06-21 NOTE — Telephone Encounter (Signed)
Per staff message and POF I have scheduled appts. Advised scheduler of appts. JMW  

## 2015-06-22 ENCOUNTER — Other Ambulatory Visit: Payer: Self-pay | Admitting: *Deleted

## 2015-06-22 ENCOUNTER — Telehealth: Payer: Self-pay | Admitting: Hematology

## 2015-06-22 DIAGNOSIS — C9200 Acute myeloblastic leukemia, not having achieved remission: Secondary | ICD-10-CM

## 2015-06-22 NOTE — Telephone Encounter (Signed)
S.w pt and advised on DEC appts...the patient ok and aware

## 2015-06-23 ENCOUNTER — Ambulatory Visit (HOSPITAL_BASED_OUTPATIENT_CLINIC_OR_DEPARTMENT_OTHER): Payer: Medicare Other

## 2015-06-23 VITALS — BP 126/67 | HR 77 | Temp 97.9°F | Resp 18

## 2015-06-23 DIAGNOSIS — C9200 Acute myeloblastic leukemia, not having achieved remission: Secondary | ICD-10-CM

## 2015-06-23 DIAGNOSIS — C9210 Chronic myeloid leukemia, BCR/ABL-positive, not having achieved remission: Secondary | ICD-10-CM

## 2015-06-23 MED ORDER — SODIUM CHLORIDE 0.9 % IJ SOLN
3.0000 mL | INTRAMUSCULAR | Status: AC | PRN
  Administered 2015-06-23: 10 mL
  Filled 2015-06-23: qty 10

## 2015-06-23 MED ORDER — SODIUM CHLORIDE 0.9 % IV SOLN
250.0000 mL | Freq: Once | INTRAVENOUS | Status: AC
  Administered 2015-06-23: 250 mL via INTRAVENOUS

## 2015-06-23 MED ORDER — HEPARIN SOD (PORK) LOCK FLUSH 100 UNIT/ML IV SOLN
250.0000 [IU] | INTRAVENOUS | Status: AC | PRN
  Administered 2015-06-23: 500 [IU]
  Filled 2015-06-23: qty 5

## 2015-06-23 NOTE — Patient Instructions (Signed)

## 2015-06-24 LAB — TYPE AND SCREEN
ABO/RH(D): A POS
ANTIBODY SCREEN: NEGATIVE
UNIT DIVISION: 0
UNIT DIVISION: 0

## 2015-06-28 ENCOUNTER — Other Ambulatory Visit (INDEPENDENT_AMBULATORY_CARE_PROVIDER_SITE_OTHER): Payer: Medicare Other

## 2015-06-28 DIAGNOSIS — R946 Abnormal results of thyroid function studies: Secondary | ICD-10-CM

## 2015-06-28 DIAGNOSIS — D649 Anemia, unspecified: Secondary | ICD-10-CM | POA: Diagnosis not present

## 2015-06-28 DIAGNOSIS — E785 Hyperlipidemia, unspecified: Secondary | ICD-10-CM

## 2015-06-28 DIAGNOSIS — Z0000 Encounter for general adult medical examination without abnormal findings: Secondary | ICD-10-CM

## 2015-06-28 LAB — CBC WITH DIFFERENTIAL/PLATELET
BASOS ABS: 0 10*3/uL (ref 0.0–0.1)
Basophils Relative: 0.4 % (ref 0.0–3.0)
EOS PCT: 2 % (ref 0.0–5.0)
Eosinophils Absolute: 0.1 10*3/uL (ref 0.0–0.7)
HCT: 37.3 % (ref 36.0–46.0)
HEMOGLOBIN: 12.3 g/dL (ref 12.0–15.0)
LYMPHS ABS: 0.8 10*3/uL (ref 0.7–4.0)
Lymphocytes Relative: 11.3 % — ABNORMAL LOW (ref 12.0–46.0)
MCHC: 33 g/dL (ref 30.0–36.0)
MCV: 91.6 fl (ref 78.0–100.0)
MONO ABS: 0.6 10*3/uL (ref 0.1–1.0)
MONOS PCT: 8.4 % (ref 3.0–12.0)
NEUTROS PCT: 77.9 % — AB (ref 43.0–77.0)
Neutro Abs: 5.3 10*3/uL (ref 1.4–7.7)
Platelets: 68 10*3/uL — ABNORMAL LOW (ref 150.0–400.0)
RBC: 4.07 Mil/uL (ref 3.87–5.11)
RDW: 17.7 % — ABNORMAL HIGH (ref 11.5–15.5)
WBC: 6.8 10*3/uL (ref 4.0–10.5)

## 2015-06-28 LAB — HEPATIC FUNCTION PANEL
ALBUMIN: 3.9 g/dL (ref 3.5–5.2)
ALT: 27 U/L (ref 0–35)
AST: 16 U/L (ref 0–37)
Alkaline Phosphatase: 98 U/L (ref 39–117)
BILIRUBIN TOTAL: 0.5 mg/dL (ref 0.2–1.2)
Bilirubin, Direct: 0 mg/dL (ref 0.0–0.3)
Total Protein: 6.8 g/dL (ref 6.0–8.3)

## 2015-06-28 LAB — BASIC METABOLIC PANEL
BUN: 25 mg/dL — ABNORMAL HIGH (ref 6–23)
CALCIUM: 9.3 mg/dL (ref 8.4–10.5)
CO2: 27 meq/L (ref 19–32)
CREATININE: 1.05 mg/dL (ref 0.40–1.20)
Chloride: 109 mEq/L (ref 96–112)
GFR: 54.88 mL/min — AB (ref >60.00)
Glucose, Bld: 113 mg/dL — ABNORMAL HIGH (ref 70–99)
Potassium: 4.9 mEq/L (ref 3.5–5.1)
SODIUM: 144 meq/L (ref 135–145)

## 2015-06-28 LAB — POCT URINALYSIS DIPSTICK
Bilirubin, UA: NEGATIVE
Blood, UA: NEGATIVE
Glucose, UA: NEGATIVE
KETONES UA: NEGATIVE
Nitrite, UA: NEGATIVE
PH UA: 5
PROTEIN UA: NEGATIVE
SPEC GRAV UA: 1.02
UROBILINOGEN UA: 0.2

## 2015-06-28 LAB — TSH: TSH: 3.76 u[IU]/mL (ref 0.35–4.50)

## 2015-06-28 LAB — LIPID PANEL
CHOLESTEROL: 183 mg/dL (ref 0–200)
HDL: 52 mg/dL (ref >39.00)
LDL CALC: 102 mg/dL — AB (ref 0–99)
NonHDL: 130.75
Total CHOL/HDL Ratio: 4
Triglycerides: 142 mg/dL (ref 0.0–149.0)
VLDL: 28.4 mg/dL (ref 0.0–40.0)

## 2015-07-03 ENCOUNTER — Ambulatory Visit (HOSPITAL_BASED_OUTPATIENT_CLINIC_OR_DEPARTMENT_OTHER): Payer: Medicare Other

## 2015-07-03 ENCOUNTER — Other Ambulatory Visit (HOSPITAL_BASED_OUTPATIENT_CLINIC_OR_DEPARTMENT_OTHER): Payer: Medicare Other

## 2015-07-03 VITALS — BP 157/71 | HR 97 | Temp 97.8°F | Resp 16

## 2015-07-03 DIAGNOSIS — Z452 Encounter for adjustment and management of vascular access device: Secondary | ICD-10-CM

## 2015-07-03 DIAGNOSIS — C9200 Acute myeloblastic leukemia, not having achieved remission: Secondary | ICD-10-CM

## 2015-07-03 DIAGNOSIS — C9201 Acute myeloblastic leukemia, in remission: Secondary | ICD-10-CM | POA: Diagnosis not present

## 2015-07-03 DIAGNOSIS — Z95828 Presence of other vascular implants and grafts: Secondary | ICD-10-CM

## 2015-07-03 LAB — CBC WITH DIFFERENTIAL/PLATELET
BASO%: 0.4 % (ref 0.0–2.0)
Basophils Absolute: 0 10*3/uL (ref 0.0–0.1)
EOS%: 2.2 % (ref 0.0–7.0)
Eosinophils Absolute: 0.2 10*3/uL (ref 0.0–0.5)
HCT: 33.5 % — ABNORMAL LOW (ref 34.8–46.6)
HGB: 11.3 g/dL — ABNORMAL LOW (ref 11.6–15.9)
LYMPH%: 13.2 % — AB (ref 14.0–49.7)
MCH: 30.5 pg (ref 25.1–34.0)
MCHC: 33.7 g/dL (ref 31.5–36.0)
MCV: 90.3 fL (ref 79.5–101.0)
MONO#: 0.6 10*3/uL (ref 0.1–0.9)
MONO%: 7.6 % (ref 0.0–14.0)
NEUT#: 5.6 10*3/uL (ref 1.5–6.5)
NEUT%: 76.6 % (ref 38.4–76.8)
PLATELETS: 61 10*3/uL — AB (ref 145–400)
RBC: 3.71 10*6/uL (ref 3.70–5.45)
RDW: 16.5 % — ABNORMAL HIGH (ref 11.2–14.5)
WBC: 7.3 10*3/uL (ref 3.9–10.3)
lymph#: 1 10*3/uL (ref 0.9–3.3)
nRBC: 0 % (ref 0–0)

## 2015-07-03 LAB — COMPREHENSIVE METABOLIC PANEL
ALBUMIN: 3.3 g/dL — AB (ref 3.5–5.0)
ALK PHOS: 99 U/L (ref 40–150)
ALT: 31 U/L (ref 0–55)
AST: 16 U/L (ref 5–34)
Anion Gap: 10 mEq/L (ref 3–11)
BUN: 26.7 mg/dL — AB (ref 7.0–26.0)
CHLORIDE: 108 meq/L (ref 98–109)
CO2: 25 mEq/L (ref 22–29)
CREATININE: 1.3 mg/dL — AB (ref 0.6–1.1)
Calcium: 8.8 mg/dL (ref 8.4–10.4)
EGFR: 40 mL/min/{1.73_m2} — ABNORMAL LOW (ref >90)
GLUCOSE: 144 mg/dL — AB (ref 70–140)
POTASSIUM: 4 meq/L (ref 3.5–5.1)
SODIUM: 143 meq/L (ref 136–145)
Total Bilirubin: 0.61 mg/dL (ref 0.20–1.20)
Total Protein: 6.7 g/dL (ref 6.4–8.3)

## 2015-07-03 LAB — HOLD TUBE, BLOOD BANK

## 2015-07-03 LAB — MAGNESIUM: Magnesium: 1.9 mg/dl (ref 1.5–2.5)

## 2015-07-03 MED ORDER — SODIUM CHLORIDE 0.9 % IJ SOLN
10.0000 mL | INTRAMUSCULAR | Status: DC | PRN
  Administered 2015-07-03: 10 mL via INTRAVENOUS
  Filled 2015-07-03: qty 10

## 2015-07-03 MED ORDER — HEPARIN SOD (PORK) LOCK FLUSH 100 UNIT/ML IV SOLN
500.0000 [IU] | Freq: Once | INTRAVENOUS | Status: AC
  Administered 2015-07-03: 500 [IU] via INTRAVENOUS
  Filled 2015-07-03: qty 5

## 2015-07-03 NOTE — Patient Instructions (Signed)

## 2015-07-06 ENCOUNTER — Ambulatory Visit (INDEPENDENT_AMBULATORY_CARE_PROVIDER_SITE_OTHER): Payer: Medicare Other | Admitting: Family Medicine

## 2015-07-06 ENCOUNTER — Encounter: Payer: Self-pay | Admitting: Family Medicine

## 2015-07-06 VITALS — BP 130/73 | HR 89 | Temp 98.1°F | Ht 60.0 in | Wt 170.0 lb

## 2015-07-06 DIAGNOSIS — Z0000 Encounter for general adult medical examination without abnormal findings: Secondary | ICD-10-CM

## 2015-07-06 DIAGNOSIS — E785 Hyperlipidemia, unspecified: Secondary | ICD-10-CM

## 2015-07-06 DIAGNOSIS — R739 Hyperglycemia, unspecified: Secondary | ICD-10-CM | POA: Diagnosis not present

## 2015-07-06 DIAGNOSIS — E039 Hypothyroidism, unspecified: Secondary | ICD-10-CM

## 2015-07-06 DIAGNOSIS — I10 Essential (primary) hypertension: Secondary | ICD-10-CM | POA: Diagnosis not present

## 2015-07-06 LAB — HEMOGLOBIN A1C: Hgb A1c MFr Bld: 5.8 % (ref 4.6–6.5)

## 2015-07-06 MED ORDER — ATORVASTATIN CALCIUM 20 MG PO TABS: ORAL_TABLET | ORAL | Status: DC

## 2015-07-06 MED ORDER — LEVOTHYROXINE SODIUM 50 MCG PO TABS: ORAL_TABLET | ORAL | Status: DC

## 2015-07-06 NOTE — Progress Notes (Signed)
Pre visit review using our clinic review tool, if applicable. No additional management support is needed unless otherwise documented below in the visit note. 

## 2015-07-06 NOTE — Progress Notes (Signed)
   Subjective:    Patient ID: Miranda Mcguire, female    DOB: 1946-01-16, 69 y.o.   MRN: [id number]  HPI 69 yr old female for a well exam. She feels well and has no complaints today. 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 She has been seeing Dr. Lissa Merlin at [facility] and she had successful chemotherapy. Her WBC and RBC counts are now normal but her platelet counts remain low.    Review of Systems  Constitutional: Negative.   HENT: Negative.   Eyes: Negative.   Respiratory: Negative.   Cardiovascular: Negative.   Gastrointestinal: Negative.   Genitourinary: Negative for dysuria, urgency, frequency, hematuria, flank pain, decreased urine volume, enuresis, difficulty urinating, pelvic pain and dyspareunia.  Musculoskeletal: Negative.   Skin: Negative.   Neurological: Negative.   Psychiatric/Behavioral: Negative.        Objective:   Physical Exam  Constitutional: She is oriented to person, place, and time. She appears well-developed and well-nourished. No distress.  HENT:  Head: Normocephalic and atraumatic.  Right Ear: External ear normal.  Left Ear: External ear normal.  Nose: Nose normal.  Mouth/Throat: Oropharynx is clear and moist. No oropharyngeal exudate.  Eyes: Conjunctivae and EOM are normal. Pupils are equal, round, and reactive to light. No scleral icterus.  Neck: Normal range of motion. Neck supple. No JVD present. No thyromegaly present.  Cardiovascular: Normal rate, regular rhythm, normal heart sounds and intact distal pulses.  Exam reveals no gallop and no friction rub.   No murmur heard. EKG normal   Pulmonary/Chest: Effort normal and breath sounds normal. No respiratory distress. She has no wheezes. She has no rales. She exhibits no tenderness.  Abdominal: Soft. Bowel sounds are normal. She exhibits no distension and no mass. There is no tenderness. There is no rebound and no guarding.  Musculoskeletal: Normal range of motion.  She exhibits no edema or tenderness.  Lymphadenopathy:    She has no cervical adenopathy.  Neurological: She is alert and oriented to person, place, and time. She has normal reflexes. No cranial nerve deficit. She exhibits normal muscle tone. Coordination normal.  Skin: Skin is warm and dry. No rash noted. No erythema.  Psychiatric: She has a normal mood and affect. Her behavior is normal. Judgment and thought content normal.          Assessment & Plan:  Well exam. Her fasting glucose is elevated so we will get a baseline A1c today. She is past due for a colonoscopy but we agreed to put this on hold for the time being. As soon as her platelets get stabilized she needs to have cataract surgery, and this will take precedence. We discussed diet and exercise advice.

## 2015-07-10 ENCOUNTER — Ambulatory Visit (HOSPITAL_COMMUNITY)
Admission: RE | Admit: 2015-07-10 | Discharge: 2015-07-10 | Disposition: A | Discharge status: Home / Self Care | Payer: Medicare Other | Source: Ambulatory Visit | Attending: Hematology | Admitting: Hematology

## 2015-07-25 ENCOUNTER — Ambulatory Visit (HOSPITAL_BASED_OUTPATIENT_CLINIC_OR_DEPARTMENT_OTHER): Payer: Medicare Other

## 2015-07-25 ENCOUNTER — Other Ambulatory Visit (HOSPITAL_BASED_OUTPATIENT_CLINIC_OR_DEPARTMENT_OTHER): Payer: Medicare Other

## 2015-07-25 DIAGNOSIS — Z95828 Presence of other vascular implants and grafts: Secondary | ICD-10-CM

## 2015-07-25 DIAGNOSIS — Z452 Encounter for adjustment and management of vascular access device: Secondary | ICD-10-CM | POA: Diagnosis not present

## 2015-07-25 DIAGNOSIS — C9201 Acute myeloblastic leukemia, in remission: Secondary | ICD-10-CM

## 2015-07-25 DIAGNOSIS — C9200 Acute myeloblastic leukemia, not having achieved remission: Secondary | ICD-10-CM

## 2015-07-25 LAB — CBC WITH DIFFERENTIAL/PLATELET
BASO%: 0.2 % (ref 0.0–2.0)
Basophils Absolute: 0 10*3/uL (ref 0.0–0.1)
EOS ABS: 0.1 10*3/uL (ref 0.0–0.5)
EOS%: 0.9 % (ref 0.0–7.0)
HEMATOCRIT: 30.4 % — AB (ref 34.8–46.6)
HGB: 10.2 g/dL — ABNORMAL LOW (ref 11.6–15.9)
LYMPH#: 0.6 10*3/uL — AB (ref 0.9–3.3)
LYMPH%: 10.7 % — ABNORMAL LOW (ref 14.0–49.7)
MCH: 31.2 pg (ref 25.1–34.0)
MCHC: 33.6 g/dL (ref 31.5–36.0)
MCV: 93 fL (ref 79.5–101.0)
MONO#: 0.5 10*3/uL (ref 0.1–0.9)
MONO%: 8.5 % (ref 0.0–14.0)
NEUT#: 4.5 10*3/uL (ref 1.5–6.5)
NEUT%: 79.7 % — AB (ref 38.4–76.8)
PLATELETS: 48 10*3/uL — AB (ref 145–400)
RBC: 3.27 10*6/uL — ABNORMAL LOW (ref 3.70–5.45)
RDW: 15.8 % — ABNORMAL HIGH (ref 11.2–14.5)
WBC: 5.6 10*3/uL (ref 3.9–10.3)

## 2015-07-25 LAB — COMPREHENSIVE METABOLIC PANEL
ALT: 20 U/L (ref 0–55)
AST: 12 U/L (ref 5–34)
Albumin: 3.4 g/dL — ABNORMAL LOW (ref 3.5–5.0)
Alkaline Phosphatase: 106 U/L (ref 40–150)
Anion Gap: 8 mEq/L (ref 3–11)
BUN: 20.2 mg/dL (ref 7.0–26.0)
CALCIUM: 8.8 mg/dL (ref 8.4–10.4)
CHLORIDE: 108 meq/L (ref 98–109)
CO2: 26 meq/L (ref 22–29)
Creatinine: 1.1 mg/dL (ref 0.6–1.1)
EGFR: 53 mL/min/{1.73_m2} — ABNORMAL LOW (ref >90)
GLUCOSE: 158 mg/dL — AB (ref 70–140)
POTASSIUM: 3.8 meq/L (ref 3.5–5.1)
SODIUM: 142 meq/L (ref 136–145)
Total Bilirubin: 0.72 mg/dL (ref 0.20–1.20)
Total Protein: 6.6 g/dL (ref 6.4–8.3)

## 2015-07-25 LAB — MAGNESIUM: MAGNESIUM: 2.2 mg/dL (ref 1.5–2.5)

## 2015-07-25 MED ORDER — SODIUM CHLORIDE 0.9 % IJ SOLN
10.0000 mL | INTRAMUSCULAR | Status: DC | PRN
  Administered 2015-07-25: 10 mL via INTRAVENOUS
  Filled 2015-07-25: qty 10

## 2015-07-25 MED ORDER — HEPARIN SOD (PORK) LOCK FLUSH 100 UNIT/ML IV SOLN
500.0000 [IU] | Freq: Once | INTRAVENOUS | Status: AC
  Administered 2015-07-25: 500 [IU] via INTRAVENOUS
  Filled 2015-07-25: qty 5

## 2015-07-25 NOTE — Patient Instructions (Signed)

## 2015-07-26 ENCOUNTER — Other Ambulatory Visit: Payer: Self-pay | Admitting: *Deleted

## 2015-07-26 ENCOUNTER — Telehealth: Payer: Self-pay | Admitting: Hematology

## 2015-07-26 DIAGNOSIS — C9200 Acute myeloblastic leukemia, not having achieved remission: Secondary | ICD-10-CM

## 2015-07-26 NOTE — Telephone Encounter (Signed)
Spoke to patient about 2/1 appointment. Patient aware.

## 2015-07-30 ENCOUNTER — Telehealth: Payer: Self-pay | Admitting: *Deleted

## 2015-07-30 NOTE — Telephone Encounter (Signed)
Pt had labs done 07/25/15.  Results were faxed to [facility] by Dr [facility] lab dept.  Received request to refax lab results again to [facility].  Results refaxed as per request. [facility]    [phone]    ;    Phone      [phone].

## 2015-08-07 NOTE — Progress Notes (Addendum)
   Subjective:    Patient ID: Miranda Mcguire, female    DOB: 09/26/1945, 70 y.o.   MRN: [id number]  HPI She is here to follow up on issues including HTN, hypothyroidism, and hyperlipidemia. She is doing well in general. Her AML leukemia seems to be responding well to her chemotherapy. Her BP is stable. She watches her diet.    Review of Systems     Objective:   Physical Exam        Assessment & Plan:  Her HTN is stable. Her hypothyroidism is stable. Her hyperlipidemia is stable. She will continue with diet control and exercise. Follow up with Oncology for the AML.

## 2015-08-08 ENCOUNTER — Other Ambulatory Visit (HOSPITAL_BASED_OUTPATIENT_CLINIC_OR_DEPARTMENT_OTHER): Payer: Medicare Other

## 2015-08-08 ENCOUNTER — Ambulatory Visit (HOSPITAL_BASED_OUTPATIENT_CLINIC_OR_DEPARTMENT_OTHER): Payer: Medicare Other

## 2015-08-08 ENCOUNTER — Telehealth: Payer: Self-pay | Admitting: *Deleted

## 2015-08-08 VITALS — BP 138/60 | HR 90 | Temp 98.0°F

## 2015-08-08 DIAGNOSIS — C9201 Acute myeloblastic leukemia, in remission: Secondary | ICD-10-CM

## 2015-08-08 DIAGNOSIS — Z452 Encounter for adjustment and management of vascular access device: Secondary | ICD-10-CM

## 2015-08-08 DIAGNOSIS — Z95828 Presence of other vascular implants and grafts: Secondary | ICD-10-CM

## 2015-08-08 DIAGNOSIS — C9200 Acute myeloblastic leukemia, not having achieved remission: Secondary | ICD-10-CM

## 2015-08-08 LAB — CBC WITH DIFFERENTIAL/PLATELET
BASO%: 0.4 % (ref 0.0–2.0)
Basophils Absolute: 0 10*3/uL (ref 0.0–0.1)
EOS%: 0.5 % (ref 0.0–7.0)
Eosinophils Absolute: 0 10*3/uL (ref 0.0–0.5)
HEMATOCRIT: 31 % — AB (ref 34.8–46.6)
HEMOGLOBIN: 10.2 g/dL — AB (ref 11.6–15.9)
LYMPH#: 0.6 10*3/uL — AB (ref 0.9–3.3)
LYMPH%: 7.7 % — ABNORMAL LOW (ref 14.0–49.7)
MCH: 31.1 pg (ref 25.1–34.0)
MCHC: 33 g/dL (ref 31.5–36.0)
MCV: 94.2 fL (ref 79.5–101.0)
MONO#: 0.5 10*3/uL (ref 0.1–0.9)
MONO%: 7.4 % (ref 0.0–14.0)
NEUT%: 84 % — ABNORMAL HIGH (ref 38.4–76.8)
NEUTROS ABS: 6.2 10*3/uL (ref 1.5–6.5)
Platelets: 56 10*3/uL — ABNORMAL LOW (ref 145–400)
RBC: 3.29 10*6/uL — ABNORMAL LOW (ref 3.70–5.45)
RDW: 15.2 % — AB (ref 11.2–14.5)
WBC: 7.4 10*3/uL (ref 3.9–10.3)

## 2015-08-08 LAB — MAGNESIUM: MAGNESIUM: 2.4 mg/dL (ref 1.5–2.5)

## 2015-08-08 LAB — COMPREHENSIVE METABOLIC PANEL
ALBUMIN: 3.2 g/dL — AB (ref 3.5–5.0)
ALK PHOS: 96 U/L (ref 40–150)
ALT: 18 U/L (ref 0–55)
AST: 10 U/L (ref 5–34)
Anion Gap: 10 mEq/L (ref 3–11)
BUN: 19.1 mg/dL (ref 7.0–26.0)
CALCIUM: 9.3 mg/dL (ref 8.4–10.4)
CO2: 26 mEq/L (ref 22–29)
CREATININE: 1.1 mg/dL (ref 0.6–1.1)
Chloride: 105 mEq/L (ref 98–109)
EGFR: 48 mL/min/{1.73_m2} — ABNORMAL LOW (ref >90)
Glucose: 138 mg/dl (ref 70–140)
Potassium: 4.2 mEq/L (ref 3.5–5.1)
Sodium: 141 mEq/L (ref 136–145)
Total Bilirubin: 0.65 mg/dL (ref 0.20–1.20)
Total Protein: 7 g/dL (ref 6.4–8.3)

## 2015-08-08 MED ORDER — HEPARIN SOD (PORK) LOCK FLUSH 100 UNIT/ML IV SOLN
500.0000 [IU] | Freq: Once | INTRAVENOUS | Status: AC
  Administered 2015-08-08: 500 [IU] via INTRAVENOUS
  Filled 2015-08-08: qty 5

## 2015-08-08 MED ORDER — SODIUM CHLORIDE 0.9% FLUSH
10.0000 mL | INTRAVENOUS | Status: DC | PRN
  Administered 2015-08-08: 10 mL via INTRAVENOUS
  Filled 2015-08-08: qty 10

## 2015-08-08 NOTE — Telephone Encounter (Signed)
Pt informed of lab results & allowed to leave.  Verified that lab results will be sent to [facility] per [facility] in lab.

## 2015-08-08 NOTE — Progress Notes (Signed)
Called pt & she had already heard from [facility] with results & states that she will need an appt in @ 3 wks & Mina Marble will call to set this up.

## 2015-08-09 ENCOUNTER — Ambulatory Visit (INDEPENDENT_AMBULATORY_CARE_PROVIDER_SITE_OTHER): Payer: Medicare Other | Admitting: Family Medicine

## 2015-08-09 ENCOUNTER — Other Ambulatory Visit: Payer: Self-pay | Admitting: *Deleted

## 2015-08-09 ENCOUNTER — Encounter: Payer: Self-pay | Admitting: Family Medicine

## 2015-08-09 VITALS — BP 127/69 | HR 98 | Temp 99.4°F | Ht 60.0 in | Wt 169.0 lb

## 2015-08-09 DIAGNOSIS — C9201 Acute myeloblastic leukemia, in remission: Secondary | ICD-10-CM

## 2015-08-09 DIAGNOSIS — M5441 Lumbago with sciatica, right side: Secondary | ICD-10-CM

## 2015-08-09 DIAGNOSIS — M5442 Lumbago with sciatica, left side: Secondary | ICD-10-CM

## 2015-08-09 DIAGNOSIS — M436 Torticollis: Secondary | ICD-10-CM | POA: Diagnosis not present

## 2015-08-09 NOTE — Progress Notes (Signed)
Pre visit review using our clinic review tool, if applicable. No additional management support is needed unless otherwise documented below in the visit note. 

## 2015-08-09 NOTE — Progress Notes (Signed)
   Subjective:    Patient ID: Miranda Mcguire, female    DOB: May 05, 1946, 70 y.o.   MRN: [id number]  HPI Here for 3 days of stiffness and pain in the right neck and right ear. No HA or ST or cough. She had labs drawn through her port yesterday and they told her the port was okay. She woke up with this pain one morning.    Review of Systems  Constitutional: Negative.   HENT: Negative for congestion, ear discharge, hearing loss, postnasal drip, sinus pressure, sore throat and trouble swallowing.   Eyes: Negative.   Respiratory: Negative.   Musculoskeletal: Positive for neck pain and neck stiffness.       Objective:   Physical Exam  Constitutional: She appears well-developed and well-nourished.  HENT:  Right Ear: External ear normal.  Left Ear: External ear normal.  Nose: Nose normal.  Mouth/Throat: Oropharynx is clear and moist.  Eyes: Conjunctivae are normal.  Neck: No thyromegaly present.  She is tender along the right sternocleidomastoid muscle, full ROM. The port seems to be okay   Lymphadenopathy:    She has no cervical adenopathy.          Assessment & Plan:  Torticollis. Use moist heat and ES Tylenol.

## 2015-08-10 ENCOUNTER — Telehealth: Payer: Self-pay | Admitting: Hematology

## 2015-08-10 NOTE — Telephone Encounter (Signed)
Lft msg for pt confirming labs/flush per 02/02 POF, mailed out schedule to pt.... KJ

## 2015-08-29 ENCOUNTER — Other Ambulatory Visit (HOSPITAL_BASED_OUTPATIENT_CLINIC_OR_DEPARTMENT_OTHER): Payer: Medicare Other

## 2015-08-29 ENCOUNTER — Ambulatory Visit (HOSPITAL_BASED_OUTPATIENT_CLINIC_OR_DEPARTMENT_OTHER): Payer: Medicare Other

## 2015-08-29 DIAGNOSIS — Z452 Encounter for adjustment and management of vascular access device: Secondary | ICD-10-CM

## 2015-08-29 DIAGNOSIS — C9201 Acute myeloblastic leukemia, in remission: Secondary | ICD-10-CM | POA: Diagnosis not present

## 2015-08-29 DIAGNOSIS — Z95828 Presence of other vascular implants and grafts: Secondary | ICD-10-CM

## 2015-08-29 LAB — CBC WITH DIFFERENTIAL/PLATELET
BASO%: 0.4 % (ref 0.0–2.0)
BASOS ABS: 0 10*3/uL (ref 0.0–0.1)
EOS%: 1 % (ref 0.0–7.0)
Eosinophils Absolute: 0 10*3/uL (ref 0.0–0.5)
HEMATOCRIT: 31.9 % — AB (ref 34.8–46.6)
HGB: 10.3 g/dL — ABNORMAL LOW (ref 11.6–15.9)
LYMPH#: 0.5 10*3/uL — AB (ref 0.9–3.3)
LYMPH%: 9.7 % — ABNORMAL LOW (ref 14.0–49.7)
MCH: 31.1 pg (ref 25.1–34.0)
MCHC: 32.3 g/dL (ref 31.5–36.0)
MCV: 96.3 fL (ref 79.5–101.0)
MONO#: 0.5 10*3/uL (ref 0.1–0.9)
MONO%: 9.2 % (ref 0.0–14.0)
NEUT#: 4 10*3/uL (ref 1.5–6.5)
NEUT%: 79.7 % — AB (ref 38.4–76.8)
PLATELETS: 67 10*3/uL — AB (ref 145–400)
RBC: 3.31 10*6/uL — ABNORMAL LOW (ref 3.70–5.45)
RDW: 13.7 % (ref 11.2–14.5)
WBC: 5 10*3/uL (ref 3.9–10.3)

## 2015-08-29 MED ORDER — SODIUM CHLORIDE 0.9% FLUSH
10.0000 mL | INTRAVENOUS | Status: DC | PRN
  Administered 2015-08-29: 10 mL via INTRAVENOUS
  Filled 2015-08-29: qty 10

## 2015-08-29 MED ORDER — HEPARIN SOD (PORK) LOCK FLUSH 100 UNIT/ML IV SOLN
500.0000 [IU] | Freq: Once | INTRAVENOUS | Status: AC
  Administered 2015-08-29: 500 [IU] via INTRAVENOUS
  Filled 2015-08-29: qty 5

## 2015-08-29 NOTE — Patient Instructions (Signed)

## 2015-09-04 DIAGNOSIS — H40013 Open angle with borderline findings, low risk, bilateral: Secondary | ICD-10-CM | POA: Diagnosis not present

## 2015-09-19 ENCOUNTER — Other Ambulatory Visit: Payer: Self-pay | Admitting: *Deleted

## 2015-09-19 DIAGNOSIS — Z886 Allergy status to analgesic agent status: Secondary | ICD-10-CM | POA: Diagnosis not present

## 2015-09-19 DIAGNOSIS — E785 Hyperlipidemia, unspecified: Secondary | ICD-10-CM | POA: Diagnosis not present

## 2015-09-19 DIAGNOSIS — C9201 Acute myeloblastic leukemia, in remission: Secondary | ICD-10-CM | POA: Diagnosis not present

## 2015-09-19 DIAGNOSIS — Z79899 Other long term (current) drug therapy: Secondary | ICD-10-CM | POA: Diagnosis not present

## 2015-09-19 DIAGNOSIS — Z95828 Presence of other vascular implants and grafts: Secondary | ICD-10-CM | POA: Diagnosis not present

## 2015-09-19 DIAGNOSIS — D649 Anemia, unspecified: Secondary | ICD-10-CM | POA: Diagnosis not present

## 2015-09-19 DIAGNOSIS — E039 Hypothyroidism, unspecified: Secondary | ICD-10-CM | POA: Diagnosis not present

## 2015-09-19 DIAGNOSIS — M549 Dorsalgia, unspecified: Secondary | ICD-10-CM | POA: Diagnosis not present

## 2015-09-19 DIAGNOSIS — I10 Essential (primary) hypertension: Secondary | ICD-10-CM | POA: Diagnosis not present

## 2015-09-19 DIAGNOSIS — C92Z1 Other myeloid leukemia, in remission: Secondary | ICD-10-CM | POA: Diagnosis not present

## 2015-09-19 DIAGNOSIS — J90 Pleural effusion, not elsewhere classified: Secondary | ICD-10-CM | POA: Diagnosis not present

## 2015-09-19 DIAGNOSIS — Z880 Allergy status to penicillin: Secondary | ICD-10-CM | POA: Diagnosis not present

## 2015-09-19 DIAGNOSIS — D696 Thrombocytopenia, unspecified: Secondary | ICD-10-CM | POA: Diagnosis not present

## 2015-09-19 DIAGNOSIS — J811 Chronic pulmonary edema: Secondary | ICD-10-CM | POA: Diagnosis not present

## 2015-09-20 ENCOUNTER — Telehealth: Payer: Self-pay | Admitting: Hematology

## 2015-09-20 NOTE — Telephone Encounter (Signed)
s.w. pt and advised on April appt.....pt ok and aware °

## 2015-10-09 ENCOUNTER — Other Ambulatory Visit: Payer: Self-pay | Admitting: Family Medicine

## 2015-10-10 ENCOUNTER — Telehealth: Payer: Self-pay | Admitting: Family Medicine

## 2015-10-10 NOTE — Telephone Encounter (Signed)
Pt only has been schd 4 wks not sure if MD wanted to do labs after that.

## 2015-10-10 NOTE — Telephone Encounter (Signed)
Can you call pt to schedule a vitamin b 12 injection? According to recommendations pt will need injection once a week for 4 weeks and then monthly thereafter.

## 2015-10-11 ENCOUNTER — Ambulatory Visit (INDEPENDENT_AMBULATORY_CARE_PROVIDER_SITE_OTHER): Payer: Medicare Other | Admitting: Family Medicine

## 2015-10-11 DIAGNOSIS — E538 Deficiency of other specified B group vitamins: Secondary | ICD-10-CM

## 2015-10-11 MED ORDER — CYANOCOBALAMIN 1000 MCG/ML IJ SOLN
1000.0000 ug | Freq: Once | INTRAMUSCULAR | Status: AC
  Administered 2015-10-11: 1000 ug via INTRAMUSCULAR

## 2015-10-18 ENCOUNTER — Ambulatory Visit (INDEPENDENT_AMBULATORY_CARE_PROVIDER_SITE_OTHER): Payer: Medicare Other | Admitting: Family Medicine

## 2015-10-18 DIAGNOSIS — E538 Deficiency of other specified B group vitamins: Secondary | ICD-10-CM

## 2015-10-18 MED ORDER — CYANOCOBALAMIN 1000 MCG/ML IJ SOLN
1000.0000 ug | Freq: Once | INTRAMUSCULAR | Status: AC
  Administered 2015-10-18: 1000 ug via INTRAMUSCULAR

## 2015-10-23 ENCOUNTER — Ambulatory Visit (INDEPENDENT_AMBULATORY_CARE_PROVIDER_SITE_OTHER): Payer: Medicare Other | Admitting: Ophthalmology

## 2015-10-23 DIAGNOSIS — H35033 Hypertensive retinopathy, bilateral: Secondary | ICD-10-CM

## 2015-10-23 DIAGNOSIS — H43813 Vitreous degeneration, bilateral: Secondary | ICD-10-CM

## 2015-10-23 DIAGNOSIS — H348312 Tributary (branch) retinal vein occlusion, right eye, stable: Secondary | ICD-10-CM | POA: Diagnosis not present

## 2015-10-23 DIAGNOSIS — H2513 Age-related nuclear cataract, bilateral: Secondary | ICD-10-CM

## 2015-10-23 DIAGNOSIS — I10 Essential (primary) hypertension: Secondary | ICD-10-CM

## 2015-10-23 DIAGNOSIS — H353132 Nonexudative age-related macular degeneration, bilateral, intermediate dry stage: Secondary | ICD-10-CM

## 2015-10-25 ENCOUNTER — Ambulatory Visit (INDEPENDENT_AMBULATORY_CARE_PROVIDER_SITE_OTHER): Payer: Medicare Other | Admitting: Family Medicine

## 2015-10-25 DIAGNOSIS — E538 Deficiency of other specified B group vitamins: Secondary | ICD-10-CM | POA: Diagnosis not present

## 2015-10-25 MED ORDER — CYANOCOBALAMIN 1000 MCG/ML IJ SOLN
1000.0000 ug | Freq: Once | INTRAMUSCULAR | Status: AC
  Administered 2015-10-25: 1000 ug via INTRAMUSCULAR

## 2015-10-30 ENCOUNTER — Other Ambulatory Visit: Payer: Self-pay

## 2015-10-30 DIAGNOSIS — Z95828 Presence of other vascular implants and grafts: Secondary | ICD-10-CM | POA: Insufficient documentation

## 2015-10-31 ENCOUNTER — Ambulatory Visit: Payer: Medicare Other

## 2015-10-31 ENCOUNTER — Other Ambulatory Visit (HOSPITAL_BASED_OUTPATIENT_CLINIC_OR_DEPARTMENT_OTHER): Payer: Medicare Other

## 2015-10-31 DIAGNOSIS — Z95828 Presence of other vascular implants and grafts: Secondary | ICD-10-CM

## 2015-10-31 DIAGNOSIS — C9201 Acute myeloblastic leukemia, in remission: Secondary | ICD-10-CM | POA: Diagnosis present

## 2015-10-31 DIAGNOSIS — C9200 Acute myeloblastic leukemia, not having achieved remission: Secondary | ICD-10-CM

## 2015-10-31 LAB — COMPREHENSIVE METABOLIC PANEL WITH GFR
ALT: 26 U/L (ref 0–55)
AST: 14 U/L (ref 5–34)
Albumin: 3.4 g/dL — ABNORMAL LOW (ref 3.5–5.0)
Alkaline Phosphatase: 95 U/L (ref 40–150)
Anion Gap: 8 meq/L (ref 3–11)
BUN: 19.4 mg/dL (ref 7.0–26.0)
CO2: 25 meq/L (ref 22–29)
Calcium: 9 mg/dL (ref 8.4–10.4)
Chloride: 109 meq/L (ref 98–109)
Creatinine: 1 mg/dL (ref 0.6–1.1)
EGFR: 55 ml/min/1.73 m2 — ABNORMAL LOW
Glucose: 120 mg/dL (ref 70–140)
Potassium: 4.1 meq/L (ref 3.5–5.1)
Sodium: 143 meq/L (ref 136–145)
Total Bilirubin: 0.47 mg/dL (ref 0.20–1.20)
Total Protein: 6.6 g/dL (ref 6.4–8.3)

## 2015-10-31 LAB — CBC WITH DIFFERENTIAL/PLATELET
BASO%: 0.4 % (ref 0.0–2.0)
Basophils Absolute: 0 10*3/uL (ref 0.0–0.1)
EOS ABS: 0.1 10*3/uL (ref 0.0–0.5)
EOS%: 1 % (ref 0.0–7.0)
HCT: 35.2 % (ref 34.8–46.6)
HGB: 11.2 g/dL — ABNORMAL LOW (ref 11.6–15.9)
LYMPH%: 10.4 % — AB (ref 14.0–49.7)
MCH: 30.5 pg (ref 25.1–34.0)
MCHC: 31.7 g/dL (ref 31.5–36.0)
MCV: 96.2 fL (ref 79.5–101.0)
MONO#: 0.6 10*3/uL (ref 0.1–0.9)
MONO%: 10.3 % (ref 0.0–14.0)
NEUT%: 77.9 % — AB (ref 38.4–76.8)
NEUTROS ABS: 4.6 10*3/uL (ref 1.5–6.5)
Platelets: 72 10*3/uL — ABNORMAL LOW (ref 145–400)
RBC: 3.66 10*6/uL — AB (ref 3.70–5.45)
RDW: 13 % (ref 11.2–14.5)
WBC: 6 10*3/uL (ref 3.9–10.3)
lymph#: 0.6 10*3/uL — ABNORMAL LOW (ref 0.9–3.3)

## 2015-10-31 LAB — MAGNESIUM: MAGNESIUM: 1.9 mg/dL (ref 1.5–2.5)

## 2015-10-31 MED ORDER — HEPARIN SOD (PORK) LOCK FLUSH 100 UNIT/ML IV SOLN
500.0000 [IU] | Freq: Once | INTRAVENOUS | Status: AC | PRN
  Administered 2015-10-31: 500 [IU] via INTRAVENOUS
  Filled 2015-10-31: qty 5

## 2015-10-31 MED ORDER — SODIUM CHLORIDE 0.9 % IJ SOLN
10.0000 mL | INTRAMUSCULAR | Status: DC | PRN
  Administered 2015-10-31 (×2): 10 mL via INTRAVENOUS
  Filled 2015-10-31: qty 10

## 2015-10-31 MED ORDER — SODIUM CHLORIDE 0.9 % IJ SOLN
10.0000 mL | INTRAMUSCULAR | Status: DC | PRN
  Filled 2015-10-31: qty 10

## 2015-10-31 MED ORDER — HEPARIN SOD (PORK) LOCK FLUSH 100 UNIT/ML IV SOLN
500.0000 [IU] | Freq: Once | INTRAVENOUS | Status: DC | PRN
  Filled 2015-10-31: qty 5

## 2015-10-31 NOTE — Patient Instructions (Signed)

## 2015-10-31 NOTE — Addendum Note (Signed)
Addended by: Clifton James D on: 10/31/2015 09:44 AM   Modules accepted: Orders

## 2015-11-01 ENCOUNTER — Ambulatory Visit (INDEPENDENT_AMBULATORY_CARE_PROVIDER_SITE_OTHER): Payer: Medicare Other | Admitting: Family Medicine

## 2015-11-01 DIAGNOSIS — E538 Deficiency of other specified B group vitamins: Secondary | ICD-10-CM | POA: Diagnosis not present

## 2015-11-01 MED ORDER — CYANOCOBALAMIN 1000 MCG/ML IJ SOLN
1000.0000 ug | Freq: Once | INTRAMUSCULAR | Status: AC
  Administered 2015-11-01: 1000 ug via INTRAMUSCULAR

## 2015-11-12 DIAGNOSIS — H2512 Age-related nuclear cataract, left eye: Secondary | ICD-10-CM | POA: Diagnosis not present

## 2015-11-12 DIAGNOSIS — H35032 Hypertensive retinopathy, left eye: Secondary | ICD-10-CM | POA: Diagnosis not present

## 2015-11-12 DIAGNOSIS — H353130 Nonexudative age-related macular degeneration, bilateral, stage unspecified: Secondary | ICD-10-CM | POA: Diagnosis not present

## 2015-11-12 DIAGNOSIS — H35031 Hypertensive retinopathy, right eye: Secondary | ICD-10-CM | POA: Diagnosis not present

## 2015-11-12 DIAGNOSIS — H25012 Cortical age-related cataract, left eye: Secondary | ICD-10-CM | POA: Diagnosis not present

## 2015-11-12 DIAGNOSIS — H353120 Nonexudative age-related macular degeneration, left eye, stage unspecified: Secondary | ICD-10-CM | POA: Diagnosis not present

## 2015-11-12 DIAGNOSIS — H3561 Retinal hemorrhage, right eye: Secondary | ICD-10-CM | POA: Diagnosis not present

## 2015-11-12 DIAGNOSIS — H35041 Retinal micro-aneurysms, unspecified, right eye: Secondary | ICD-10-CM | POA: Diagnosis not present

## 2015-11-12 DIAGNOSIS — H25011 Cortical age-related cataract, right eye: Secondary | ICD-10-CM | POA: Diagnosis not present

## 2015-11-27 DIAGNOSIS — H2512 Age-related nuclear cataract, left eye: Secondary | ICD-10-CM | POA: Diagnosis not present

## 2015-11-30 ENCOUNTER — Ambulatory Visit (INDEPENDENT_AMBULATORY_CARE_PROVIDER_SITE_OTHER): Payer: Medicare Other | Admitting: Family Medicine

## 2015-11-30 DIAGNOSIS — E538 Deficiency of other specified B group vitamins: Secondary | ICD-10-CM

## 2015-11-30 MED ORDER — CYANOCOBALAMIN 1000 MCG/ML IJ SOLN
1000.0000 ug | Freq: Once | INTRAMUSCULAR | Status: AC
  Administered 2015-11-30: 1000 ug via INTRAMUSCULAR

## 2015-12-24 DIAGNOSIS — H25011 Cortical age-related cataract, right eye: Secondary | ICD-10-CM | POA: Diagnosis not present

## 2015-12-24 DIAGNOSIS — H2511 Age-related nuclear cataract, right eye: Secondary | ICD-10-CM | POA: Diagnosis not present

## 2015-12-26 ENCOUNTER — Telehealth: Payer: Self-pay | Admitting: *Deleted

## 2015-12-26 ENCOUNTER — Telehealth: Payer: Self-pay | Admitting: Hematology

## 2015-12-26 ENCOUNTER — Other Ambulatory Visit: Payer: Self-pay | Admitting: *Deleted

## 2015-12-26 DIAGNOSIS — D649 Anemia, unspecified: Secondary | ICD-10-CM | POA: Diagnosis not present

## 2015-12-26 DIAGNOSIS — D696 Thrombocytopenia, unspecified: Secondary | ICD-10-CM | POA: Diagnosis not present

## 2015-12-26 DIAGNOSIS — Z9842 Cataract extraction status, left eye: Secondary | ICD-10-CM | POA: Diagnosis not present

## 2015-12-26 DIAGNOSIS — Z79899 Other long term (current) drug therapy: Secondary | ICD-10-CM | POA: Diagnosis not present

## 2015-12-26 DIAGNOSIS — E039 Hypothyroidism, unspecified: Secondary | ICD-10-CM | POA: Diagnosis not present

## 2015-12-26 DIAGNOSIS — N179 Acute kidney failure, unspecified: Secondary | ICD-10-CM | POA: Diagnosis not present

## 2015-12-26 DIAGNOSIS — Z886 Allergy status to analgesic agent status: Secondary | ICD-10-CM | POA: Diagnosis not present

## 2015-12-26 DIAGNOSIS — E785 Hyperlipidemia, unspecified: Secondary | ICD-10-CM | POA: Diagnosis not present

## 2015-12-26 DIAGNOSIS — Z880 Allergy status to penicillin: Secondary | ICD-10-CM | POA: Diagnosis not present

## 2015-12-26 DIAGNOSIS — M549 Dorsalgia, unspecified: Secondary | ICD-10-CM | POA: Diagnosis not present

## 2015-12-26 DIAGNOSIS — G8929 Other chronic pain: Secondary | ICD-10-CM | POA: Diagnosis not present

## 2015-12-26 DIAGNOSIS — C9201 Acute myeloblastic leukemia, in remission: Secondary | ICD-10-CM | POA: Diagnosis not present

## 2015-12-26 DIAGNOSIS — I10 Essential (primary) hypertension: Secondary | ICD-10-CM | POA: Diagnosis not present

## 2015-12-26 NOTE — Telephone Encounter (Signed)
per pof to sch pt appt-cld pt and gave pt time & date for a flush only appt per pof-no labs ordered or sch

## 2015-12-26 NOTE — Telephone Encounter (Signed)
Received faxed order from [facility] requesting appt for pt to have port flush on 02/06/16.  POF sent to scheduler. Shelbie Hutching, NP  @  [facility]       [phone]       ;      Fax       [phone].

## 2016-01-01 ENCOUNTER — Other Ambulatory Visit (INDEPENDENT_AMBULATORY_CARE_PROVIDER_SITE_OTHER): Payer: Medicare Other

## 2016-01-01 DIAGNOSIS — Z0000 Encounter for general adult medical examination without abnormal findings: Secondary | ICD-10-CM | POA: Diagnosis not present

## 2016-01-01 LAB — BASIC METABOLIC PANEL
BUN: 15 mg/dL (ref 6–23)
CALCIUM: 8.9 mg/dL (ref 8.4–10.5)
CO2: 28 meq/L (ref 19–32)
Chloride: 108 mEq/L (ref 96–112)
Creatinine, Ser: 1.05 mg/dL (ref 0.40–1.20)
GFR: 54.8 mL/min — AB (ref >60.00)
Glucose, Bld: 109 mg/dL — ABNORMAL HIGH (ref 70–99)
POTASSIUM: 4.2 meq/L (ref 3.5–5.1)
SODIUM: 143 meq/L (ref 135–145)

## 2016-01-01 LAB — HEPATIC FUNCTION PANEL
ALBUMIN: 3.9 g/dL (ref 3.5–5.2)
ALK PHOS: 88 U/L (ref 39–117)
ALT: 20 U/L (ref 0–35)
AST: 11 U/L (ref 0–37)
Bilirubin, Direct: 0.1 mg/dL (ref 0.0–0.3)
TOTAL PROTEIN: 6.4 g/dL (ref 6.0–8.3)
Total Bilirubin: 0.6 mg/dL (ref 0.2–1.2)

## 2016-01-01 LAB — POC URINALSYSI DIPSTICK (AUTOMATED)
BILIRUBIN UA: NEGATIVE
Glucose, UA: NEGATIVE
Ketones, UA: NEGATIVE
NITRITE UA: NEGATIVE
PH UA: 5.5
PROTEIN UA: NEGATIVE
RBC UA: NEGATIVE
Spec Grav, UA: 1.02
UROBILINOGEN UA: 0.2

## 2016-01-01 LAB — CBC WITH DIFFERENTIAL/PLATELET
BASOS ABS: 0 10*3/uL (ref 0.0–0.1)
Basophils Relative: 0.4 % (ref 0.0–3.0)
EOS PCT: 0.9 % (ref 0.0–5.0)
Eosinophils Absolute: 0 10*3/uL (ref 0.0–0.7)
HEMATOCRIT: 32.9 % — AB (ref 36.0–46.0)
Hemoglobin: 11 g/dL — ABNORMAL LOW (ref 12.0–15.0)
LYMPHS PCT: 11.5 % — AB (ref 12.0–46.0)
Lymphs Abs: 0.6 10*3/uL — ABNORMAL LOW (ref 0.7–4.0)
MCHC: 33.5 g/dL (ref 30.0–36.0)
MCV: 95.2 fl (ref 78.0–100.0)
MONOS PCT: 8.8 % (ref 3.0–12.0)
Monocytes Absolute: 0.5 10*3/uL (ref 0.1–1.0)
Neutro Abs: 4.2 10*3/uL (ref 1.4–7.7)
Neutrophils Relative %: 78.4 % — ABNORMAL HIGH (ref 43.0–77.0)
Platelets: 73 10*3/uL — ABNORMAL LOW (ref 150.0–400.0)
RBC: 3.46 Mil/uL — AB (ref 3.87–5.11)
RDW: 13.1 % (ref 11.5–15.5)
WBC: 5.4 10*3/uL (ref 4.0–10.5)

## 2016-01-01 LAB — LIPID PANEL
Cholesterol: 169 mg/dL (ref 0–200)
HDL: 53.4 mg/dL (ref >39.00)
LDL Cholesterol: 85 mg/dL (ref 0–99)
NONHDL: 115.87
Total CHOL/HDL Ratio: 3
Triglycerides: 153 mg/dL — ABNORMAL HIGH (ref 0.0–149.0)
VLDL: 30.6 mg/dL (ref 0.0–40.0)

## 2016-01-01 LAB — TSH: TSH: 2.45 u[IU]/mL (ref 0.35–4.50)

## 2016-01-03 ENCOUNTER — Ambulatory Visit (INDEPENDENT_AMBULATORY_CARE_PROVIDER_SITE_OTHER): Payer: Medicare Other | Admitting: Family Medicine

## 2016-01-03 DIAGNOSIS — E538 Deficiency of other specified B group vitamins: Secondary | ICD-10-CM

## 2016-01-03 MED ORDER — CYANOCOBALAMIN 1000 MCG/ML IJ SOLN
1000.0000 ug | Freq: Once | INTRAMUSCULAR | Status: AC
  Administered 2016-01-03: 1000 ug via INTRAMUSCULAR

## 2016-01-14 ENCOUNTER — Telehealth: Payer: Self-pay | Admitting: Family Medicine

## 2016-01-14 DIAGNOSIS — M545 Low back pain, unspecified: Secondary | ICD-10-CM

## 2016-01-14 NOTE — Telephone Encounter (Signed)
Pt is still waiting on mri that [facility] had requesting for dr fry to order. Mri of back

## 2016-01-15 DIAGNOSIS — H2511 Age-related nuclear cataract, right eye: Secondary | ICD-10-CM | POA: Diagnosis not present

## 2016-01-15 DIAGNOSIS — H25811 Combined forms of age-related cataract, right eye: Secondary | ICD-10-CM | POA: Diagnosis not present

## 2016-01-21 ENCOUNTER — Other Ambulatory Visit: Payer: Self-pay | Admitting: Family Medicine

## 2016-01-21 NOTE — Addendum Note (Signed)
Addended by: Aggie Hacker A on: 01/21/2016 09:41 AM   Modules accepted: Orders

## 2016-01-21 NOTE — Telephone Encounter (Signed)
The MRI was ordered 

## 2016-01-21 NOTE — Addendum Note (Signed)
Addended by: Alysia Penna A on: 01/21/2016 08:44 AM   Modules accepted: Orders

## 2016-01-21 NOTE — Telephone Encounter (Signed)
Sent pt a my chart message with below information.

## 2016-01-21 NOTE — Telephone Encounter (Signed)
Had to change MRI to with and without contrast, per Dr. Sarajane Jews, new orders were put in computer.

## 2016-01-30 ENCOUNTER — Ambulatory Visit
Admission: RE | Admit: 2016-01-30 | Discharge: 2016-01-30 | Disposition: A | Discharge status: Home / Self Care | Payer: Medicare Other | Source: Ambulatory Visit | Attending: Family Medicine | Admitting: Family Medicine

## 2016-01-30 DIAGNOSIS — M545 Low back pain, unspecified: Secondary | ICD-10-CM

## 2016-01-30 DIAGNOSIS — M4806 Spinal stenosis, lumbar region: Secondary | ICD-10-CM | POA: Diagnosis not present

## 2016-01-30 MED ORDER — GADOBENATE DIMEGLUMINE 529 MG/ML IV SOLN
15.0000 mL | Freq: Once | INTRAVENOUS | Status: AC | PRN
  Administered 2016-01-30: 15 mL via INTRAVENOUS

## 2016-01-30 NOTE — Addendum Note (Signed)
Addended by: Alysia Penna A on: 01/30/2016 01:22 PM   Modules accepted: Orders

## 2016-01-31 ENCOUNTER — Ambulatory Visit (INDEPENDENT_AMBULATORY_CARE_PROVIDER_SITE_OTHER): Payer: Medicare Other | Admitting: Family Medicine

## 2016-01-31 DIAGNOSIS — E538 Deficiency of other specified B group vitamins: Secondary | ICD-10-CM | POA: Diagnosis not present

## 2016-02-01 ENCOUNTER — Telehealth: Payer: Self-pay | Admitting: Family Medicine

## 2016-02-01 DIAGNOSIS — E538 Deficiency of other specified B group vitamins: Secondary | ICD-10-CM | POA: Diagnosis not present

## 2016-02-01 MED ORDER — CYANOCOBALAMIN 1000 MCG/ML IJ SOLN
1000.0000 ug | Freq: Once | INTRAMUSCULAR | Status: AC
  Administered 2016-02-01: 1000 ug via INTRAMUSCULAR

## 2016-02-01 NOTE — Telephone Encounter (Signed)
Error/ltd ° °

## 2016-02-05 ENCOUNTER — Encounter: Payer: Self-pay | Admitting: Family Medicine

## 2016-02-06 ENCOUNTER — Ambulatory Visit (HOSPITAL_BASED_OUTPATIENT_CLINIC_OR_DEPARTMENT_OTHER): Payer: Medicare Other

## 2016-02-06 DIAGNOSIS — Z452 Encounter for adjustment and management of vascular access device: Secondary | ICD-10-CM | POA: Diagnosis not present

## 2016-02-06 DIAGNOSIS — C9201 Acute myeloblastic leukemia, in remission: Secondary | ICD-10-CM | POA: Diagnosis not present

## 2016-02-06 DIAGNOSIS — Z95828 Presence of other vascular implants and grafts: Secondary | ICD-10-CM

## 2016-02-06 MED ORDER — HEPARIN SOD (PORK) LOCK FLUSH 100 UNIT/ML IV SOLN
500.0000 [IU] | Freq: Once | INTRAVENOUS | Status: AC | PRN
  Administered 2016-02-06: 500 [IU] via INTRAVENOUS
  Filled 2016-02-06: qty 5

## 2016-02-06 MED ORDER — SODIUM CHLORIDE 0.9 % IJ SOLN
10.0000 mL | INTRAMUSCULAR | Status: DC | PRN
  Administered 2016-02-06: 10 mL via INTRAVENOUS
  Filled 2016-02-06: qty 10

## 2016-02-06 NOTE — Patient Instructions (Signed)

## 2016-02-14 DIAGNOSIS — Z6835 Body mass index (BMI) 35.0-35.9, adult: Secondary | ICD-10-CM | POA: Diagnosis not present

## 2016-02-14 DIAGNOSIS — M5126 Other intervertebral disc displacement, lumbar region: Secondary | ICD-10-CM | POA: Diagnosis not present

## 2016-02-14 DIAGNOSIS — M5137 Other intervertebral disc degeneration, lumbosacral region: Secondary | ICD-10-CM | POA: Diagnosis not present

## 2016-02-27 DIAGNOSIS — M5126 Other intervertebral disc displacement, lumbar region: Secondary | ICD-10-CM | POA: Diagnosis not present

## 2016-02-27 DIAGNOSIS — M5416 Radiculopathy, lumbar region: Secondary | ICD-10-CM | POA: Diagnosis not present

## 2016-03-03 ENCOUNTER — Ambulatory Visit (INDEPENDENT_AMBULATORY_CARE_PROVIDER_SITE_OTHER): Payer: Medicare Other | Admitting: Family Medicine

## 2016-03-03 DIAGNOSIS — E538 Deficiency of other specified B group vitamins: Secondary | ICD-10-CM

## 2016-03-03 MED ORDER — CYANOCOBALAMIN 1000 MCG/ML IJ SOLN
1000.0000 ug | Freq: Once | INTRAMUSCULAR | Status: AC
  Administered 2016-03-03: 1000 ug via INTRAMUSCULAR

## 2016-03-07 ENCOUNTER — Other Ambulatory Visit: Payer: Self-pay | Admitting: Family Medicine

## 2016-03-20 DIAGNOSIS — Z6835 Body mass index (BMI) 35.0-35.9, adult: Secondary | ICD-10-CM | POA: Diagnosis not present

## 2016-03-20 DIAGNOSIS — M4806 Spinal stenosis, lumbar region: Secondary | ICD-10-CM | POA: Diagnosis not present

## 2016-03-20 DIAGNOSIS — M5137 Other intervertebral disc degeneration, lumbosacral region: Secondary | ICD-10-CM | POA: Diagnosis not present

## 2016-03-24 DIAGNOSIS — Z9842 Cataract extraction status, left eye: Secondary | ICD-10-CM | POA: Diagnosis not present

## 2016-03-24 DIAGNOSIS — D61818 Other pancytopenia: Secondary | ICD-10-CM | POA: Diagnosis not present

## 2016-03-24 DIAGNOSIS — Z9221 Personal history of antineoplastic chemotherapy: Secondary | ICD-10-CM | POA: Diagnosis not present

## 2016-03-24 DIAGNOSIS — J90 Pleural effusion, not elsewhere classified: Secondary | ICD-10-CM | POA: Diagnosis not present

## 2016-03-24 DIAGNOSIS — D759 Disease of blood and blood-forming organs, unspecified: Secondary | ICD-10-CM | POA: Diagnosis not present

## 2016-03-24 DIAGNOSIS — M549 Dorsalgia, unspecified: Secondary | ICD-10-CM | POA: Diagnosis not present

## 2016-03-24 DIAGNOSIS — C9202 Acute myeloblastic leukemia, in relapse: Secondary | ICD-10-CM | POA: Diagnosis not present

## 2016-03-24 DIAGNOSIS — N179 Acute kidney failure, unspecified: Secondary | ICD-10-CM | POA: Diagnosis not present

## 2016-03-24 DIAGNOSIS — I10 Essential (primary) hypertension: Secondary | ICD-10-CM | POA: Diagnosis not present

## 2016-03-24 DIAGNOSIS — Z9841 Cataract extraction status, right eye: Secondary | ICD-10-CM | POA: Diagnosis not present

## 2016-03-24 DIAGNOSIS — J811 Chronic pulmonary edema: Secondary | ICD-10-CM | POA: Diagnosis not present

## 2016-03-24 DIAGNOSIS — E039 Hypothyroidism, unspecified: Secondary | ICD-10-CM | POA: Diagnosis not present

## 2016-03-24 DIAGNOSIS — Z79899 Other long term (current) drug therapy: Secondary | ICD-10-CM | POA: Diagnosis not present

## 2016-03-24 DIAGNOSIS — Z886 Allergy status to analgesic agent status: Secondary | ICD-10-CM | POA: Diagnosis not present

## 2016-03-24 DIAGNOSIS — C9201 Acute myeloblastic leukemia, in remission: Secondary | ICD-10-CM | POA: Diagnosis not present

## 2016-03-24 DIAGNOSIS — D696 Thrombocytopenia, unspecified: Secondary | ICD-10-CM | POA: Diagnosis not present

## 2016-03-24 DIAGNOSIS — Z880 Allergy status to penicillin: Secondary | ICD-10-CM | POA: Diagnosis not present

## 2016-03-24 DIAGNOSIS — E785 Hyperlipidemia, unspecified: Secondary | ICD-10-CM | POA: Diagnosis not present

## 2016-03-31 ENCOUNTER — Ambulatory Visit: Payer: Medicare Other | Admitting: Family Medicine

## 2016-03-31 DIAGNOSIS — H578 Other specified disorders of eye and adnexa: Secondary | ICD-10-CM | POA: Diagnosis not present

## 2016-03-31 DIAGNOSIS — E876 Hypokalemia: Secondary | ICD-10-CM | POA: Diagnosis not present

## 2016-03-31 DIAGNOSIS — H1133 Conjunctival hemorrhage, bilateral: Secondary | ICD-10-CM | POA: Diagnosis not present

## 2016-03-31 DIAGNOSIS — D703 Neutropenia due to infection: Secondary | ICD-10-CM | POA: Diagnosis not present

## 2016-03-31 DIAGNOSIS — H269 Unspecified cataract: Secondary | ICD-10-CM | POA: Diagnosis not present

## 2016-03-31 DIAGNOSIS — S20329A Blister (nonthermal) of unspecified front wall of thorax, initial encounter: Secondary | ICD-10-CM | POA: Diagnosis not present

## 2016-03-31 DIAGNOSIS — D61810 Antineoplastic chemotherapy induced pancytopenia: Secondary | ICD-10-CM | POA: Diagnosis not present

## 2016-03-31 DIAGNOSIS — J189 Pneumonia, unspecified organism: Secondary | ICD-10-CM | POA: Diagnosis not present

## 2016-03-31 DIAGNOSIS — J810 Acute pulmonary edema: Secondary | ICD-10-CM | POA: Diagnosis not present

## 2016-03-31 DIAGNOSIS — E8770 Fluid overload, unspecified: Secondary | ICD-10-CM | POA: Diagnosis not present

## 2016-03-31 DIAGNOSIS — R8299 Other abnormal findings in urine: Secondary | ICD-10-CM | POA: Diagnosis not present

## 2016-03-31 DIAGNOSIS — H05229 Edema of unspecified orbit: Secondary | ICD-10-CM | POA: Diagnosis not present

## 2016-03-31 DIAGNOSIS — Z880 Allergy status to penicillin: Secondary | ICD-10-CM | POA: Diagnosis not present

## 2016-03-31 DIAGNOSIS — R5081 Fever presenting with conditions classified elsewhere: Secondary | ICD-10-CM | POA: Diagnosis not present

## 2016-03-31 DIAGNOSIS — A0472 Enterocolitis due to Clostridium difficile, not specified as recurrent: Secondary | ICD-10-CM | POA: Diagnosis not present

## 2016-03-31 DIAGNOSIS — Z5111 Encounter for antineoplastic chemotherapy: Secondary | ICD-10-CM | POA: Diagnosis not present

## 2016-03-31 DIAGNOSIS — A040 Enteropathogenic Escherichia coli infection: Secondary | ICD-10-CM | POA: Diagnosis not present

## 2016-03-31 DIAGNOSIS — D709 Neutropenia, unspecified: Secondary | ICD-10-CM | POA: Diagnosis not present

## 2016-03-31 DIAGNOSIS — A498 Other bacterial infections of unspecified site: Secondary | ICD-10-CM | POA: Diagnosis not present

## 2016-03-31 DIAGNOSIS — J90 Pleural effusion, not elsewhere classified: Secondary | ICD-10-CM | POA: Diagnosis not present

## 2016-03-31 DIAGNOSIS — C9201 Acute myeloblastic leukemia, in remission: Secondary | ICD-10-CM | POA: Diagnosis not present

## 2016-03-31 DIAGNOSIS — R229 Localized swelling, mass and lump, unspecified: Secondary | ICD-10-CM | POA: Diagnosis not present

## 2016-03-31 DIAGNOSIS — Z006 Encounter for examination for normal comparison and control in clinical research program: Secondary | ICD-10-CM | POA: Diagnosis not present

## 2016-03-31 DIAGNOSIS — E039 Hypothyroidism, unspecified: Secondary | ICD-10-CM | POA: Diagnosis not present

## 2016-03-31 DIAGNOSIS — R0981 Nasal congestion: Secondary | ICD-10-CM | POA: Diagnosis not present

## 2016-03-31 DIAGNOSIS — N3090 Cystitis, unspecified without hematuria: Secondary | ICD-10-CM | POA: Diagnosis not present

## 2016-03-31 DIAGNOSIS — Z66 Do not resuscitate: Secondary | ICD-10-CM | POA: Diagnosis present

## 2016-03-31 DIAGNOSIS — H109 Unspecified conjunctivitis: Secondary | ICD-10-CM | POA: Diagnosis not present

## 2016-03-31 DIAGNOSIS — C9202 Acute myeloblastic leukemia, in relapse: Secondary | ICD-10-CM | POA: Diagnosis not present

## 2016-03-31 DIAGNOSIS — R5084 Febrile nonhemolytic transfusion reaction: Secondary | ICD-10-CM | POA: Diagnosis not present

## 2016-03-31 DIAGNOSIS — R000 Tachycardia, unspecified: Secondary | ICD-10-CM | POA: Diagnosis not present

## 2016-03-31 DIAGNOSIS — E785 Hyperlipidemia, unspecified: Secondary | ICD-10-CM | POA: Diagnosis not present

## 2016-03-31 DIAGNOSIS — R197 Diarrhea, unspecified: Secondary | ICD-10-CM | POA: Diagnosis not present

## 2016-03-31 DIAGNOSIS — K521 Toxic gastroenteritis and colitis: Secondary | ICD-10-CM | POA: Diagnosis not present

## 2016-03-31 DIAGNOSIS — R509 Fever, unspecified: Secondary | ICD-10-CM | POA: Diagnosis not present

## 2016-03-31 DIAGNOSIS — I10 Essential (primary) hypertension: Secondary | ICD-10-CM | POA: Diagnosis not present

## 2016-04-25 ENCOUNTER — Telehealth: Payer: Self-pay | Admitting: *Deleted

## 2016-04-25 ENCOUNTER — Other Ambulatory Visit: Payer: Self-pay | Admitting: *Deleted

## 2016-04-25 DIAGNOSIS — C9200 Acute myeloblastic leukemia, not having achieved remission: Secondary | ICD-10-CM

## 2016-04-25 NOTE — Telephone Encounter (Signed)
Pt called and left message requesting appts for labs on Mon and Thur 10/23 and 10/26.   Stated she had received chemo at [facility], and was discharged today.  Pt was instructed by nurse at [facility] to call Dr. Ernestina Penna office to request labs draw next week.   No orders received from [facility].  Attempted to call Dr. Lissa Merlin' office several times unsuccessfully. Spoke with pt and informed pt that our office has not received any orders nor any instructions about pt.  Gave pt direct fax number to nurses' desk.  Pt stated she would contact [facility] for orders to be faxed to our office. Pt understood that once orders received, and Dr. Burr Medico reviews orders; pt will be contacted with appts. Patricia's     Phone       [phone].

## 2016-04-28 ENCOUNTER — Other Ambulatory Visit: Payer: Self-pay | Admitting: Nurse Practitioner

## 2016-04-28 ENCOUNTER — Other Ambulatory Visit (HOSPITAL_BASED_OUTPATIENT_CLINIC_OR_DEPARTMENT_OTHER): Payer: Medicare Other

## 2016-04-28 ENCOUNTER — Ambulatory Visit (HOSPITAL_BASED_OUTPATIENT_CLINIC_OR_DEPARTMENT_OTHER): Payer: Medicare Other | Admitting: Nurse Practitioner

## 2016-04-28 ENCOUNTER — Telehealth: Payer: Self-pay | Admitting: Hematology

## 2016-04-28 ENCOUNTER — Telehealth: Payer: Self-pay | Admitting: Nurse Practitioner

## 2016-04-28 VITALS — BP 134/53 | HR 93 | Temp 98.0°F | Resp 18 | Ht 60.0 in | Wt 181.9 lb

## 2016-04-28 DIAGNOSIS — C9200 Acute myeloblastic leukemia, not having achieved remission: Secondary | ICD-10-CM

## 2016-04-28 DIAGNOSIS — A0472 Enterocolitis due to Clostridium difficile, not specified as recurrent: Secondary | ICD-10-CM

## 2016-04-28 DIAGNOSIS — C9202 Acute myeloblastic leukemia, in relapse: Secondary | ICD-10-CM

## 2016-04-28 LAB — CBC WITH DIFFERENTIAL/PLATELET
BASO%: 0 % (ref 0.0–2.0)
Basophils Absolute: 0 10*3/uL (ref 0.0–0.1)
EOS%: 0 % (ref 0.0–7.0)
Eosinophils Absolute: 0 10*3/uL (ref 0.0–0.5)
HEMATOCRIT: 29.3 % — AB (ref 34.8–46.6)
HGB: 10.2 g/dL — ABNORMAL LOW (ref 11.6–15.9)
LYMPH%: 22 % (ref 14.0–49.7)
MCH: 29.7 pg (ref 25.1–34.0)
MCHC: 34.8 g/dL (ref 31.5–36.0)
MCV: 85.2 fL (ref 79.5–101.0)
MONO#: 0.7 10*3/uL (ref 0.1–0.9)
MONO%: 32.7 % — AB (ref 0.0–14.0)
NEUT%: 45.3 % (ref 38.4–76.8)
NEUTROS ABS: 1 10*3/uL — AB (ref 1.5–6.5)
Platelets: 25 10*3/uL — ABNORMAL LOW (ref 145–400)
RBC: 3.44 10*6/uL — AB (ref 3.70–5.45)
RDW: 13.6 % (ref 11.2–14.5)
WBC: 2.2 10*3/uL — AB (ref 3.9–10.3)
lymph#: 0.5 10*3/uL — ABNORMAL LOW (ref 0.9–3.3)
nRBC: 0 % (ref 0–0)

## 2016-04-28 LAB — COMPREHENSIVE METABOLIC PANEL
ALT: 99 U/L — AB (ref 0–55)
AST: 35 U/L — AB (ref 5–34)
Albumin: 2.8 g/dL — ABNORMAL LOW (ref 3.5–5.0)
Alkaline Phosphatase: 166 U/L — ABNORMAL HIGH (ref 40–150)
Anion Gap: 12 mEq/L — ABNORMAL HIGH (ref 3–11)
BUN: 11.1 mg/dL (ref 7.0–26.0)
CHLORIDE: 104 meq/L (ref 98–109)
CO2: 29 meq/L (ref 22–29)
CREATININE: 1.2 mg/dL — AB (ref 0.6–1.1)
Calcium: 8.1 mg/dL — ABNORMAL LOW (ref 8.4–10.4)
EGFR: 44 mL/min/{1.73_m2} — ABNORMAL LOW (ref >90)
GLUCOSE: 121 mg/dL (ref 70–140)
Potassium: 2.9 mEq/L — CL (ref 3.5–5.1)
SODIUM: 145 meq/L (ref 136–145)
Total Bilirubin: 0.75 mg/dL (ref 0.20–1.20)
Total Protein: 6.5 g/dL (ref 6.4–8.3)

## 2016-04-28 LAB — MAGNESIUM: Magnesium: 1.4 mg/dl — CL (ref 1.5–2.5)

## 2016-04-28 LAB — HOLD TUBE, BLOOD BANK

## 2016-04-28 MED ORDER — POTASSIUM CHLORIDE CRYS ER 20 MEQ PO TBCR: 20.0000 meq | EXTENDED_RELEASE_TABLET | Freq: Two times a day (BID) | ORAL | 0 refills | Status: DC

## 2016-04-28 NOTE — Telephone Encounter (Signed)
I notified Ms. Miranda Mcguire potassium is low. Prescription sent to her pharmacy for Kdur 20 meq twice a day for 3 days then once daily. We will repeat a basic metabolic panel when she returns on 05/01/2016.

## 2016-04-28 NOTE — Progress Notes (Signed)
  [place] OFFICE PROGRESS NOTE   Diagnosis:  Relapsed AML  SUMMARY OF ONCOLOGIC HISTORY: 11/14/2014 Initial Diagnosis AML (acute myeloid leukemia) in remission ([place]) with monocytic differentiation with normal cytogenetics; FLT-3 ITD and TKD negative. Myeloid panel PENDING.  11/15/2014 - Chemotherapy Induction with cytarabine and daunorubicin (7+3), nadir bmbx 11/28/14 showing residual disease  11/29/2014 - Chemotherapy Re-induction with cytarabine and daunorubicin (5+2); nadir bmbx 12/11/14 negative  12/28/2014 Remission Recovery bmbx c/w remission with incomplete platelet recovery (CRi)  01/17/2015 - Chemotherapy C1 Hidac 1gm/m2 + Neulasta  03/08/2015 -Chemotherapy C2 Hidac 1gm/m2 + Neulasta   04/25/2015-Chemotherapy C3 Hidac 1gm/m2 + Neulasta   06/13/2015 End of treatment bone marrow biopsy consistent with remission with normal cytogenetics 03/24/2016 Bone marrow biopsy consistent with relapsed AML (5-30% cellularity and 20% blasts) with mild marrow fibrosis 03/31/2016 Reinduction therapy with CPI-613, HiDAC and Mitoxantrone   INTERVAL HISTORY:   Miranda Mcguire returns for follow-up. Bone marrow biopsy 03/24/2016 was consistent with relapsed AML. She was admitted for reinduction therapy with CPI-613, HiDAC and Mitoxantrone at [facility] on 03/31/2016. Hospital course complicated by neutropenic fever, hospital acquired pneumonia and C. difficile colitis. She was discharged home 04/25/2016. Restaging bone marrow biopsy planned 05/05/2016.  She feels very weak. No bleeding. No fever. No shortness of breath. She has a slight dry cough. Appetite is poor. No urinary symptoms. She continues vancomycin for recent C. difficile colitis. She denies diarrhea but has noted stools are less formed. She estimates 2 bowel movements today.  Objective:  Vital signs in last 24 hours:  Blood pressure (!) 134/53, pulse 93, temperature 98 F (36.7 C), temperature source Oral, resp. rate 18, height 5'  (1.524 m), weight 181 lb 14.4 oz (82.5 kg), SpO2 98 %.    HEENT: Resolving left subconjunctival hemorrhage. No thrush or ulcers. Resp: Lungs clear bilaterally. Cardio: Regular rate and rhythm. GI: Abdomen soft and nontender. No organomegaly. Vascular: Trace pitting edema at the lower legs bilaterally. Neuro: Alert and oriented.  Skin: No rash. Port-A-Cath without erythema. Resolving tape reaction.   Lab Results:  Lab Results  Component Value Date   WBC 2.2 (L) 04/28/2016   HGB 10.2 (L) 04/28/2016   HCT 29.3 (L) 04/28/2016   MCV 85.2 04/28/2016   PLT 25 (L) 04/28/2016   NEUTROABS 1.0 (L) 04/28/2016    Imaging:  No results found.  Medications: I have reviewed the patient's current medications.  Assessment/Plan: 1. Relapsed AML. Bone marrow biopsy 03/24/2016 consistent with relapsed AML (5-30% cellularity and 20% blasts) with mild marrow fibrosis; 03/31/2016 reinduction therapy with CPI-613, HiDAC and Mitoxantrone 2. C. difficile colitis. Currently completing a course of oral vancomycin.   Disposition:Miranda Mcguire was recently found to have relapsed AML. She completed reinduction therapy at [facility] beginning 03/31/2016. She was discharged from the hospital 04/25/2016. As compared to her discharge labs the hemoglobin is stable, neutrophil and platelet counts improved. Of note, she reports receiving a platelet transfusion just prior to discharge on 04/25/2016. She will return for a CBC on 05/01/2016, transfusion support as needed. She is scheduled to return to [facility] 05/05/2016 for a follow-up bone marrow biopsy. We scheduled a return visit here in 3 weeks. She will contact the office in the interim with any problems.  Plan reviewed with Dr. Burr Medico. 25 minutes were spent face-to-face at today's visit with the majority of that time involved in counseling/coordination of care.    Ned Card ANP/GNP-BC   04/28/2016  3:51 PM

## 2016-04-28 NOTE — Telephone Encounter (Signed)
Called patient regarding an appointment scheduled for 1:45 p.m. Today, per scheduled message, from Thu, Dr. Ernestina Penna desk nurse. Appointment confirmed. 04/28/16

## 2016-05-01 ENCOUNTER — Other Ambulatory Visit (HOSPITAL_BASED_OUTPATIENT_CLINIC_OR_DEPARTMENT_OTHER): Payer: Medicare Other

## 2016-05-01 ENCOUNTER — Ambulatory Visit (HOSPITAL_BASED_OUTPATIENT_CLINIC_OR_DEPARTMENT_OTHER): Payer: Medicare Other

## 2016-05-01 ENCOUNTER — Telehealth: Payer: Self-pay | Admitting: *Deleted

## 2016-05-01 DIAGNOSIS — C9200 Acute myeloblastic leukemia, not having achieved remission: Secondary | ICD-10-CM

## 2016-05-01 DIAGNOSIS — C9201 Acute myeloblastic leukemia, in remission: Secondary | ICD-10-CM

## 2016-05-01 DIAGNOSIS — C9202 Acute myeloblastic leukemia, in relapse: Secondary | ICD-10-CM

## 2016-05-01 DIAGNOSIS — Z95828 Presence of other vascular implants and grafts: Secondary | ICD-10-CM

## 2016-05-01 DIAGNOSIS — Z452 Encounter for adjustment and management of vascular access device: Secondary | ICD-10-CM

## 2016-05-01 LAB — CBC WITH DIFFERENTIAL/PLATELET
BASO%: 0.4 % (ref 0.0–2.0)
Basophils Absolute: 0 10*3/uL (ref 0.0–0.1)
EOS ABS: 0 10*3/uL (ref 0.0–0.5)
EOS%: 0 % (ref 0.0–7.0)
HCT: 29.3 % — ABNORMAL LOW (ref 34.8–46.6)
HGB: 10.1 g/dL — ABNORMAL LOW (ref 11.6–15.9)
LYMPH%: 22.2 % (ref 14.0–49.7)
MCH: 29.4 pg (ref 25.1–34.0)
MCHC: 34.5 g/dL (ref 31.5–36.0)
MCV: 85.4 fL (ref 79.5–101.0)
MONO#: 0.6 10*3/uL (ref 0.1–0.9)
MONO%: 23.8 % — AB (ref 0.0–14.0)
NEUT#: 1.4 10*3/uL — ABNORMAL LOW (ref 1.5–6.5)
NEUT%: 53.6 % (ref 38.4–76.8)
NRBC: 0 % (ref 0–0)
PLATELETS: 23 10*3/uL — AB (ref 145–400)
RBC: 3.43 10*6/uL — AB (ref 3.70–5.45)
RDW: 13.6 % (ref 11.2–14.5)
WBC: 2.6 10*3/uL — ABNORMAL LOW (ref 3.9–10.3)
lymph#: 0.6 10*3/uL — ABNORMAL LOW (ref 0.9–3.3)

## 2016-05-01 LAB — COMPREHENSIVE METABOLIC PANEL
ALK PHOS: 157 U/L — AB (ref 40–150)
ALT: 88 U/L — ABNORMAL HIGH (ref 0–55)
ANION GAP: 10 meq/L (ref 3–11)
AST: 28 U/L (ref 5–34)
Albumin: 2.9 g/dL — ABNORMAL LOW (ref 3.5–5.0)
BILIRUBIN TOTAL: 0.75 mg/dL (ref 0.20–1.20)
BUN: 13.3 mg/dL (ref 7.0–26.0)
CO2: 29 meq/L (ref 22–29)
Calcium: 8.1 mg/dL — ABNORMAL LOW (ref 8.4–10.4)
Chloride: 106 mEq/L (ref 98–109)
Creatinine: 1.1 mg/dL (ref 0.6–1.1)
EGFR: 50 mL/min/{1.73_m2} — AB (ref >90)
Glucose: 149 mg/dl — ABNORMAL HIGH (ref 70–140)
Potassium: 3.5 mEq/L (ref 3.5–5.1)
Sodium: 146 mEq/L — ABNORMAL HIGH (ref 136–145)
TOTAL PROTEIN: 6.4 g/dL (ref 6.4–8.3)

## 2016-05-01 LAB — MAGNESIUM: Magnesium: 1.4 mg/dl — CL (ref 1.5–2.5)

## 2016-05-01 MED ORDER — SODIUM CHLORIDE 0.9 % IJ SOLN
10.0000 mL | INTRAMUSCULAR | Status: DC | PRN
  Administered 2016-05-01: 10 mL via INTRAVENOUS
  Filled 2016-05-01: qty 10

## 2016-05-01 MED ORDER — HEPARIN SOD (PORK) LOCK FLUSH 100 UNIT/ML IV SOLN
500.0000 [IU] | Freq: Once | INTRAVENOUS | Status: AC | PRN
  Administered 2016-05-01: 500 [IU] via INTRAVENOUS
  Filled 2016-05-01: qty 5

## 2016-05-01 NOTE — Patient Instructions (Signed)

## 2016-05-01 NOTE — Telephone Encounter (Signed)
Faxed lab results to [facility].

## 2016-05-05 ENCOUNTER — Telehealth: Payer: Self-pay | Admitting: Hematology

## 2016-05-05 ENCOUNTER — Other Ambulatory Visit: Payer: Self-pay | Admitting: *Deleted

## 2016-05-05 ENCOUNTER — Ambulatory Visit (HOSPITAL_BASED_OUTPATIENT_CLINIC_OR_DEPARTMENT_OTHER): Payer: Medicare Other

## 2016-05-05 DIAGNOSIS — C9200 Acute myeloblastic leukemia, not having achieved remission: Secondary | ICD-10-CM

## 2016-05-05 DIAGNOSIS — Z452 Encounter for adjustment and management of vascular access device: Secondary | ICD-10-CM | POA: Diagnosis not present

## 2016-05-05 DIAGNOSIS — C9201 Acute myeloblastic leukemia, in remission: Secondary | ICD-10-CM

## 2016-05-05 DIAGNOSIS — Z95828 Presence of other vascular implants and grafts: Secondary | ICD-10-CM

## 2016-05-05 LAB — COMPREHENSIVE METABOLIC PANEL
ALT: 74 U/L — AB (ref 0–55)
ANION GAP: 10 meq/L (ref 3–11)
AST: 23 U/L (ref 5–34)
Albumin: 3.2 g/dL — ABNORMAL LOW (ref 3.5–5.0)
Alkaline Phosphatase: 149 U/L (ref 40–150)
BUN: 19.9 mg/dL (ref 7.0–26.0)
CALCIUM: 8.9 mg/dL (ref 8.4–10.4)
CHLORIDE: 107 meq/L (ref 98–109)
CO2: 25 meq/L (ref 22–29)
CREATININE: 1.4 mg/dL — AB (ref 0.6–1.1)
EGFR: 37 mL/min/{1.73_m2} — ABNORMAL LOW (ref >90)
Glucose: 126 mg/dl (ref 70–140)
POTASSIUM: 4.4 meq/L (ref 3.5–5.1)
Sodium: 143 mEq/L (ref 136–145)
Total Bilirubin: 0.7 mg/dL (ref 0.20–1.20)
Total Protein: 6.9 g/dL (ref 6.4–8.3)

## 2016-05-05 LAB — CBC WITH DIFFERENTIAL/PLATELET
BASO%: 0.3 % (ref 0.0–2.0)
Basophils Absolute: 0 10*3/uL (ref 0.0–0.1)
EOS%: 0.5 % (ref 0.0–7.0)
Eosinophils Absolute: 0 10*3/uL (ref 0.0–0.5)
HCT: 30.2 % — ABNORMAL LOW (ref 34.8–46.6)
HGB: 10.4 g/dL — ABNORMAL LOW (ref 11.6–15.9)
LYMPH%: 18.7 % (ref 14.0–49.7)
MCH: 29.5 pg (ref 25.1–34.0)
MCHC: 34.4 g/dL (ref 31.5–36.0)
MCV: 85.8 fL (ref 79.5–101.0)
MONO#: 0.7 10*3/uL (ref 0.1–0.9)
MONO%: 18 % — AB (ref 0.0–14.0)
NEUT%: 62.5 % (ref 38.4–76.8)
NEUTROS ABS: 2.5 10*3/uL (ref 1.5–6.5)
Platelets: 45 10*3/uL — ABNORMAL LOW (ref 145–400)
RBC: 3.52 10*6/uL — AB (ref 3.70–5.45)
RDW: 13.8 % (ref 11.2–14.5)
WBC: 4 10*3/uL (ref 3.9–10.3)
lymph#: 0.7 10*3/uL — ABNORMAL LOW (ref 0.9–3.3)
nRBC: 1 % — ABNORMAL HIGH (ref 0–0)

## 2016-05-05 LAB — MAGNESIUM: MAGNESIUM: 2 mg/dL (ref 1.5–2.5)

## 2016-05-05 MED ORDER — SODIUM CHLORIDE 0.9 % IJ SOLN
10.0000 mL | INTRAMUSCULAR | Status: DC | PRN
  Administered 2016-05-05: 10 mL via INTRAVENOUS
  Filled 2016-05-05: qty 10

## 2016-05-05 MED ORDER — HEPARIN SOD (PORK) LOCK FLUSH 100 UNIT/ML IV SOLN
500.0000 [IU] | Freq: Once | INTRAVENOUS | Status: AC | PRN
  Administered 2016-05-05: 500 [IU] via INTRAVENOUS
  Filled 2016-05-05: qty 5

## 2016-05-05 NOTE — Telephone Encounter (Signed)
Flush appointment scheduled for 05/08/16 per desk nurse, Janifer Adie. Patient is aware of appointment information. 05/05/16

## 2016-05-05 NOTE — Patient Instructions (Signed)

## 2016-05-07 ENCOUNTER — Telehealth: Payer: Self-pay | Admitting: *Deleted

## 2016-05-07 NOTE — Telephone Encounter (Signed)
FYI "Just learned today scheduled to see Dr. Lissa Merlin in [facility] for Bone Marrow Biopsy on 05-19-2016.  Request reschedule 05-19-2016 office visit to another day."  PREFERS EARLY AT 8 to 9:00 am.  Will be here tomorrow as scheduled for lab.  Scheduling message sent with this request.

## 2016-05-08 ENCOUNTER — Telehealth: Payer: Self-pay | Admitting: Hematology

## 2016-05-08 ENCOUNTER — Ambulatory Visit (INDEPENDENT_AMBULATORY_CARE_PROVIDER_SITE_OTHER): Payer: Medicare Other | Admitting: Ophthalmology

## 2016-05-08 ENCOUNTER — Ambulatory Visit (HOSPITAL_BASED_OUTPATIENT_CLINIC_OR_DEPARTMENT_OTHER): Payer: Medicare Other

## 2016-05-08 ENCOUNTER — Other Ambulatory Visit: Payer: Self-pay | Admitting: Hematology

## 2016-05-08 ENCOUNTER — Other Ambulatory Visit (HOSPITAL_BASED_OUTPATIENT_CLINIC_OR_DEPARTMENT_OTHER): Payer: Medicare Other

## 2016-05-08 DIAGNOSIS — Z452 Encounter for adjustment and management of vascular access device: Secondary | ICD-10-CM | POA: Diagnosis not present

## 2016-05-08 DIAGNOSIS — C9202 Acute myeloblastic leukemia, in relapse: Secondary | ICD-10-CM | POA: Diagnosis not present

## 2016-05-08 DIAGNOSIS — C9200 Acute myeloblastic leukemia, not having achieved remission: Secondary | ICD-10-CM

## 2016-05-08 DIAGNOSIS — Z95828 Presence of other vascular implants and grafts: Secondary | ICD-10-CM

## 2016-05-08 LAB — COMPREHENSIVE METABOLIC PANEL
ALT: 60 U/L — AB (ref 0–55)
ANION GAP: 8 meq/L (ref 3–11)
AST: 19 U/L (ref 5–34)
Albumin: 3.2 g/dL — ABNORMAL LOW (ref 3.5–5.0)
Alkaline Phosphatase: 142 U/L (ref 40–150)
BUN: 19.6 mg/dL (ref 7.0–26.0)
CALCIUM: 8.9 mg/dL (ref 8.4–10.4)
CHLORIDE: 106 meq/L (ref 98–109)
CO2: 26 meq/L (ref 22–29)
Creatinine: 1.2 mg/dL — ABNORMAL HIGH (ref 0.6–1.1)
EGFR: 46 mL/min/{1.73_m2} — ABNORMAL LOW (ref >90)
Glucose: 143 mg/dl — ABNORMAL HIGH (ref 70–140)
POTASSIUM: 4.4 meq/L (ref 3.5–5.1)
Sodium: 139 mEq/L (ref 136–145)
Total Bilirubin: 0.8 mg/dL (ref 0.20–1.20)
Total Protein: 6.7 g/dL (ref 6.4–8.3)

## 2016-05-08 LAB — CBC WITH DIFFERENTIAL/PLATELET
BASO%: 0.8 % (ref 0.0–2.0)
BASOS ABS: 0 10*3/uL (ref 0.0–0.1)
EOS%: 0.5 % (ref 0.0–7.0)
Eosinophils Absolute: 0 10*3/uL (ref 0.0–0.5)
HEMATOCRIT: 30.7 % — AB (ref 34.8–46.6)
HGB: 10.2 g/dL — ABNORMAL LOW (ref 11.6–15.9)
LYMPH#: 0.6 10*3/uL — AB (ref 0.9–3.3)
LYMPH%: 13.6 % — AB (ref 14.0–49.7)
MCH: 29.1 pg (ref 25.1–34.0)
MCHC: 33.3 g/dL (ref 31.5–36.0)
MCV: 87.4 fL (ref 79.5–101.0)
MONO#: 0.8 10*3/uL (ref 0.1–0.9)
MONO%: 19.3 % — ABNORMAL HIGH (ref 0.0–14.0)
NEUT#: 2.8 10*3/uL (ref 1.5–6.5)
NEUT%: 65.8 % (ref 38.4–76.8)
Platelets: 59 10*3/uL — ABNORMAL LOW (ref 145–400)
RBC: 3.51 10*6/uL — AB (ref 3.70–5.45)
RDW: 14.1 % (ref 11.2–14.5)
WBC: 4.3 10*3/uL (ref 3.9–10.3)

## 2016-05-08 LAB — MAGNESIUM: MAGNESIUM: 2 mg/dL (ref 1.5–2.5)

## 2016-05-08 MED ORDER — HEPARIN SOD (PORK) LOCK FLUSH 100 UNIT/ML IV SOLN
500.0000 [IU] | Freq: Once | INTRAVENOUS | Status: AC | PRN
  Administered 2016-05-08: 500 [IU] via INTRAVENOUS
  Filled 2016-05-08: qty 5

## 2016-05-08 MED ORDER — SODIUM CHLORIDE 0.9 % IJ SOLN
10.0000 mL | INTRAMUSCULAR | Status: DC | PRN
  Administered 2016-05-08: 10 mL via INTRAVENOUS
  Filled 2016-05-08: qty 10

## 2016-05-08 NOTE — Telephone Encounter (Signed)
Patient made a visit to cancel all 11/14 appointments except the appointment with Dr. Burr Medico. Patient will have port flushed and labs drawn at [place] on 11/13.

## 2016-05-12 ENCOUNTER — Ambulatory Visit (HOSPITAL_BASED_OUTPATIENT_CLINIC_OR_DEPARTMENT_OTHER): Payer: Medicare Other

## 2016-05-12 ENCOUNTER — Other Ambulatory Visit: Payer: Self-pay | Admitting: *Deleted

## 2016-05-12 ENCOUNTER — Telehealth: Payer: Self-pay | Admitting: Hematology

## 2016-05-12 ENCOUNTER — Other Ambulatory Visit (HOSPITAL_BASED_OUTPATIENT_CLINIC_OR_DEPARTMENT_OTHER): Payer: Medicare Other

## 2016-05-12 DIAGNOSIS — C9202 Acute myeloblastic leukemia, in relapse: Secondary | ICD-10-CM | POA: Diagnosis not present

## 2016-05-12 DIAGNOSIS — Z452 Encounter for adjustment and management of vascular access device: Secondary | ICD-10-CM | POA: Diagnosis not present

## 2016-05-12 DIAGNOSIS — Z95828 Presence of other vascular implants and grafts: Secondary | ICD-10-CM

## 2016-05-12 DIAGNOSIS — C9200 Acute myeloblastic leukemia, not having achieved remission: Secondary | ICD-10-CM

## 2016-05-12 LAB — CBC WITH DIFFERENTIAL/PLATELET
BASO%: 0.6 % (ref 0.0–2.0)
Basophils Absolute: 0 10*3/uL (ref 0.0–0.1)
EOS ABS: 0 10*3/uL (ref 0.0–0.5)
EOS%: 0.6 % (ref 0.0–7.0)
HCT: 29.4 % — ABNORMAL LOW (ref 34.8–46.6)
HEMOGLOBIN: 9.8 g/dL — AB (ref 11.6–15.9)
LYMPH#: 0.6 10*3/uL — AB (ref 0.9–3.3)
LYMPH%: 11.7 % — AB (ref 14.0–49.7)
MCH: 29.5 pg (ref 25.1–34.0)
MCHC: 33.5 g/dL (ref 31.5–36.0)
MCV: 88 fL (ref 79.5–101.0)
MONO#: 0.7 10*3/uL (ref 0.1–0.9)
MONO%: 14.8 % — ABNORMAL HIGH (ref 0.0–14.0)
NEUT%: 72.3 % (ref 38.4–76.8)
NEUTROS ABS: 3.6 10*3/uL (ref 1.5–6.5)
PLATELETS: 74 10*3/uL — AB (ref 145–400)
RBC: 3.34 10*6/uL — AB (ref 3.70–5.45)
RDW: 14.7 % — AB (ref 11.2–14.5)
WBC: 5 10*3/uL (ref 3.9–10.3)

## 2016-05-12 MED ORDER — HEPARIN SOD (PORK) LOCK FLUSH 100 UNIT/ML IV SOLN
500.0000 [IU] | Freq: Once | INTRAVENOUS | Status: AC | PRN
  Administered 2016-05-12: 500 [IU] via INTRAVENOUS
  Filled 2016-05-12: qty 5

## 2016-05-12 MED ORDER — SODIUM CHLORIDE 0.9 % IJ SOLN
10.0000 mL | INTRAMUSCULAR | Status: DC | PRN
  Administered 2016-05-12: 10 mL via INTRAVENOUS
  Filled 2016-05-12: qty 10

## 2016-05-12 NOTE — Telephone Encounter (Signed)
sw pt to confirm 11/6 appt per LOS

## 2016-05-19 ENCOUNTER — Other Ambulatory Visit: Payer: Medicare Other

## 2016-05-19 ENCOUNTER — Ambulatory Visit: Payer: Medicare Other | Admitting: Hematology

## 2016-05-19 DIAGNOSIS — Z23 Encounter for immunization: Secondary | ICD-10-CM | POA: Diagnosis not present

## 2016-05-19 DIAGNOSIS — Z6835 Body mass index (BMI) 35.0-35.9, adult: Secondary | ICD-10-CM | POA: Diagnosis not present

## 2016-05-19 DIAGNOSIS — D696 Thrombocytopenia, unspecified: Secondary | ICD-10-CM | POA: Diagnosis not present

## 2016-05-19 DIAGNOSIS — D649 Anemia, unspecified: Secondary | ICD-10-CM | POA: Diagnosis not present

## 2016-05-19 DIAGNOSIS — I10 Essential (primary) hypertension: Secondary | ICD-10-CM | POA: Diagnosis not present

## 2016-05-19 DIAGNOSIS — Z880 Allergy status to penicillin: Secondary | ICD-10-CM | POA: Diagnosis not present

## 2016-05-19 DIAGNOSIS — Z006 Encounter for examination for normal comparison and control in clinical research program: Secondary | ICD-10-CM | POA: Diagnosis not present

## 2016-05-19 DIAGNOSIS — E785 Hyperlipidemia, unspecified: Secondary | ICD-10-CM | POA: Diagnosis not present

## 2016-05-19 DIAGNOSIS — C9202 Acute myeloblastic leukemia, in relapse: Secondary | ICD-10-CM | POA: Diagnosis not present

## 2016-05-19 DIAGNOSIS — E538 Deficiency of other specified B group vitamins: Secondary | ICD-10-CM | POA: Diagnosis not present

## 2016-05-19 DIAGNOSIS — E039 Hypothyroidism, unspecified: Secondary | ICD-10-CM | POA: Diagnosis not present

## 2016-05-19 NOTE — Progress Notes (Signed)
[place]  [phone] [phone]  Clinic Follow up Note   Patient Care Team: Laurey Morale, MD as PCP - General Luci Bank, MD as Referring Physician (Oncology) Truitt Merle, MD as Consulting Physician (Hematology) 05/20/2016  SUMMARY OF ONCOLOGIC HISTORY: AML (acute myeloid leukemia) in remission  11/14/2014 Initial Diagnosis AML (acute myeloid leukemia) in remission ([place]) with monocytic differentiation with normal cytogenetics; FLT-3 ITD and TKD negative. Myeloid panel PENDING.  11/15/2014 - Chemotherapy Induction with cytarabine and daunorubicin (7+3), nadir bmbx 11/28/14 showing residual disease  11/29/2014 - Chemotherapy Re-induction with cytarabine and daunorubicin (5+2); nadir bmbx 12/11/14 negative  12/28/2014 Remission Recovery bmbx c/w remission with incomplete platelet recovery (CRi)  01/17/2015 - Chemotherapy C1 Hidac 1gm/m2 + Neulasta  03/08/2015 -Chemotherapy C2 Hidac 1gm/m2 + Neulasta   04/25/2015 - Chemotherapy  C3 cons with HiDAC (1gm/m2) IV q12h on days 1, 3, and 5 x 6 doses with Neulasta support administered 06/13/2015 Biopsy:  End-of-treatment bone marrow biopsy c/w remission with normal cytogenetics 03/24/2016 Relapse:  Bone marrow biopsy consistent with relapsed AML (5-30% cellularity and 20% blasts) with mild marrow fibrosis; cytogenetics pending 03/31/2016: [name] and receive reinduction therapy with CPI-613, HiDAC and Mitoxantrone, Hospital course complicated by neutropenic fever, hospital acquired pneumonia and C. difficile colitis. She was discharged home 04/25/2016.  05/19/2016: repeated bone marrow biopsy, result pending    INTERVAL HISTORY: Miranda Mcguire returns for follow-up. She had a reduction chemotherapy in September 2017 after her leukemia relapse, and overall tolerated well. She has not required much blood transfusion since her chemotherapy. She had repeat the bone marrow biopsy at [facility] yesterday, results still  pending. Her appetite and energy level has overall improved, she denies any pain, fever, bleeding or other symptoms.  REVIEW OF SYSTEMS:   Constitutional: Denies fevers, chills or abnormal weight loss, (+) fatigue  Eyes: Denies blurriness of vision Ears, nose, mouth, throat, and face: Denies mucositis or sore throat Respiratory: Denies cough, dyspnea or wheezes Cardiovascular: Denies palpitation, chest discomfort or lower extremity swelling Gastrointestinal:  Denies nausea, heartburn or change in bowel habits Skin: Denies abnormal skin rashes Lymphatics: Denies new lymphadenopathy or easy bruising Neurological:Denies numbness, tingling or new weaknesses Behavioral/Psych: Mood is stable, no new changes  All other systems were reviewed with the patient and are negative.  MEDICAL HISTORY:  Past Medical History:  Diagnosis Date  . Allergy   . AML (acute myeloid leukemia) ([place])    se[facility] Dr. Phill Myron at [facility]   . Anemia    nos  . Cancer ([place])   . Cataracts, bilateral    sees Dr. Baldemar Lenis   . Endometrial polyp    post-menopausal bleeding  . Hyperlipidemia   . Hypertension   . OA (osteoarthritis)   . OP (osteoporosis)    last dexa 08-15-08    SURGICAL HISTORY: Past Surgical History:  Procedure Laterality Date  . COLONOSCOPY  02-14-08   per Dr. Ardis Hughs, repeat in 5 yrs  . DILATION AND CURETTAGE OF UTERUS    . endometrial polypectomy     with D and C 03-08-09 pr dr Gardenia Phlegm  . herniated disc     L4-5 per Dr Saintclair Halsted  . TONSILECTOMY, ADENOIDECTOMY, [place]      I have reviewed the social history and family history with the patient and they are unchanged from previous note.  ALLERGIES:  is allergic to penicillins and aspirin.  MEDICATIONS:  Current Outpatient Prescriptions  Medication Sig Dispense Refill  .  amLODipine (NORVASC) 10 MG tablet Take 10 mg by mouth daily.    . cyanocobalamin (,VITAMIN B-12,) 1000 MCG/ML injection Inject 1,000 mcg into  the muscle every 30 (thirty) days.    Marland Kitchen levothyroxine (SYNTHROID, LEVOTHROID) 50 MCG tablet TAKE 1 TABLET (50 MCG TOTAL) BY MOUTH DAILY. 90 tablet 3  . lidocaine-prilocaine (EMLA) cream Apply a small amount to skin over PAC 30-45 minutes prior to access.    . loratadine (CLARITIN) 10 MG tablet Take 10 mg by mouth daily. allertec    . Metoprolol Tartrate 37.5 MG TABS Take 37.5 mg by mouth 2 (two) times daily. Take 1 1/2 tablet    . potassium chloride SA (K-DUR,KLOR-CON) 20 MEQ tablet Take 1 tablet (20 mEq total) by mouth 2 (two) times daily. Take twice a day for 3 days, then once daily 30 tablet 0   No current facility-administered medications for this visit.     PHYSICAL EXAMINATION: ECOG PERFORMANCE STATUS: 2 - Symptomatic, <50% confined to bed  Vitals:   05/20/16 0846  BP: (!) 141/55  Pulse: 100  Resp: 16  Temp: 98.4 F (36.9 C)   Filed Weights   05/20/16 0846  Weight: 172 lb 1.6 oz (78.1 kg)    GENERAL:alert, no distress and comfortable SKIN: skin color, texture, turgor are normal, no rashes or significant lesions, except a small ecchymosis and subcutaneous hematoma at the lateral of her left thigh EYES: normal, Conjunctiva are pink and non-injected, sclera clear OROPHARYNX:no exudate, no erythema and lips, buccal mucosa, and tongue normal  NECK: supple, thyroid normal size, non-tender, without nodularity LYMPH:  no palpable lymphadenopathy in the cervical, axillary or inguinal LUNGS: clear to auscultation and percussion with normal breathing effort HEART: regular rate & rhythm and no murmurs and no lower extremity edema ABDOMEN:abdomen soft, non-tender and normal bowel sounds Musculoskeletal:no cyanosis of digits and no clubbing  NEURO: alert & oriented x 3 with fluent speech, no focal motor/sensory deficits  LABORATORY DATA:  I have reviewed the data as listed CBC Latest Ref Rng & Units 05/12/2016 05/08/2016 05/05/2016  WBC 3.9 - 10.3 10e3/uL 5.0 4.3 4.0  Hemoglobin 11.6  - 15.9 g/dL 9.8(L) 10.2(L) 10.4(L)  Hematocrit 34.8 - 46.6 % 29.4(L) 30.7(L) 30.2(L)  Platelets 145 - 400 10e3/uL 74(L) 59(L) 45(L)     CMP Latest Ref Rng & Units 05/08/2016 05/05/2016 05/01/2016  Glucose 70 - 140 mg/dl 143(H) 126 149(H)  BUN 7.0 - 26.0 mg/dL 19.6 19.9 13.3  Creatinine 0.6 - 1.1 mg/dL 1.2(H) 1.4(H) 1.1  Sodium 136 - 145 mEq/L 139 143 146(H)  Potassium 3.5 - 5.1 mEq/L 4.4 4.4 3.5  Chloride 96 - 112 mEq/L - - -  CO2 22 - 29 mEq/L _0 Calcium 8.4 - 10.4 mg/dL 8.9 8.9 8.1(L)  Total Protein 6.4 - 8.3 g/dL 6.7 6.9 6.4  Total Bilirubin 0.20 - 1.20 mg/dL 0.80 0.70 0.75  Alkaline Phos 40 - 150 U/L 142 149 157(H)  AST 5 - 34 U/L _1 ALT 0 - 55 U/L 60(H) 74(H) 88(H)   Her lab test on 05/19/2069 at [facility] 6.2, hemoglobin 9.4, platelet 64K CMP within normal limits except glucose 182  RADIOGRAPHIC STUDIES: I have personally reviewed the radiological images as listed and agreed with the findings in the report. No results found.   ASSESSMENT & PLAN:  70 year old Caucasian female  1. Relapsed AML, normal cytogenetics -She is status post induction chemotherapy in September 2017 -Repeat a bone marrow biopsy from  yesterday results still pending -She is scheduled to have second round chemotherapy on November 27 -She is scheduled to see bone marrow transplant Dr. Minna Antis at [facility] next Monday -Continue lab monitoring here, I'll contact [place] to coordinate her lab test and blood transfusion as needed (Hb<8.0 or plt<20K, with irradiated blood products) -will see her as needed in future    ll questions were answered. The patient knows to call the clinic with any problems, questions or concerns. No barriers to learning was detected.  I spent 10 minutes counseling the patient face to face. The total time spent in the appointment was 15 minutes and more than 50% was on counseling and review of test results     Truitt Merle, MD 05/20/16 9:11 AM

## 2016-05-20 ENCOUNTER — Ambulatory Visit (HOSPITAL_BASED_OUTPATIENT_CLINIC_OR_DEPARTMENT_OTHER): Payer: Medicare Other | Admitting: Hematology

## 2016-05-20 ENCOUNTER — Other Ambulatory Visit: Payer: Medicare Other

## 2016-05-20 ENCOUNTER — Encounter: Payer: Self-pay | Admitting: Hematology

## 2016-05-20 VITALS — BP 141/55 | HR 100 | Temp 98.4°F | Resp 16 | Ht 60.0 in | Wt 172.1 lb

## 2016-05-20 DIAGNOSIS — C9202 Acute myeloblastic leukemia, in relapse: Secondary | ICD-10-CM | POA: Diagnosis not present

## 2016-05-20 DIAGNOSIS — C9201 Acute myeloblastic leukemia, in remission: Secondary | ICD-10-CM | POA: Diagnosis not present

## 2016-05-22 DIAGNOSIS — C9201 Acute myeloblastic leukemia, in remission: Secondary | ICD-10-CM | POA: Diagnosis not present

## 2016-05-23 ENCOUNTER — Other Ambulatory Visit: Payer: Self-pay | Admitting: Nurse Practitioner

## 2016-06-02 DIAGNOSIS — E039 Hypothyroidism, unspecified: Secondary | ICD-10-CM | POA: Diagnosis not present

## 2016-06-02 DIAGNOSIS — D696 Thrombocytopenia, unspecified: Secondary | ICD-10-CM | POA: Diagnosis not present

## 2016-06-02 DIAGNOSIS — E785 Hyperlipidemia, unspecified: Secondary | ICD-10-CM | POA: Diagnosis not present

## 2016-06-02 DIAGNOSIS — Z5111 Encounter for antineoplastic chemotherapy: Secondary | ICD-10-CM | POA: Diagnosis not present

## 2016-06-02 DIAGNOSIS — D6481 Anemia due to antineoplastic chemotherapy: Secondary | ICD-10-CM | POA: Diagnosis not present

## 2016-06-02 DIAGNOSIS — D6959 Other secondary thrombocytopenia: Secondary | ICD-10-CM | POA: Diagnosis present

## 2016-06-02 DIAGNOSIS — C9201 Acute myeloblastic leukemia, in remission: Secondary | ICD-10-CM | POA: Diagnosis not present

## 2016-06-02 DIAGNOSIS — C9202 Acute myeloblastic leukemia, in relapse: Secondary | ICD-10-CM | POA: Diagnosis not present

## 2016-06-02 DIAGNOSIS — T451X5S Adverse effect of antineoplastic and immunosuppressive drugs, sequela: Secondary | ICD-10-CM | POA: Diagnosis not present

## 2016-06-02 DIAGNOSIS — Z79890 Hormone replacement therapy (postmenopausal): Secondary | ICD-10-CM | POA: Diagnosis not present

## 2016-06-02 DIAGNOSIS — Z01818 Encounter for other preprocedural examination: Secondary | ICD-10-CM | POA: Diagnosis not present

## 2016-06-02 DIAGNOSIS — Z87891 Personal history of nicotine dependence: Secondary | ICD-10-CM | POA: Diagnosis not present

## 2016-06-02 DIAGNOSIS — Z7189 Other specified counseling: Secondary | ICD-10-CM | POA: Diagnosis not present

## 2016-06-02 DIAGNOSIS — I10 Essential (primary) hypertension: Secondary | ICD-10-CM | POA: Diagnosis not present

## 2016-06-02 DIAGNOSIS — Z886 Allergy status to analgesic agent status: Secondary | ICD-10-CM | POA: Diagnosis not present

## 2016-06-02 DIAGNOSIS — K219 Gastro-esophageal reflux disease without esophagitis: Secondary | ICD-10-CM | POA: Diagnosis present

## 2016-06-02 DIAGNOSIS — Z006 Encounter for examination for normal comparison and control in clinical research program: Secondary | ICD-10-CM | POA: Diagnosis not present

## 2016-06-02 DIAGNOSIS — Z79899 Other long term (current) drug therapy: Secondary | ICD-10-CM | POA: Diagnosis not present

## 2016-06-02 DIAGNOSIS — Z91018 Allergy to other foods: Secondary | ICD-10-CM | POA: Diagnosis not present

## 2016-06-02 DIAGNOSIS — C9301 Acute monoblastic/monocytic leukemia, in remission: Secondary | ICD-10-CM | POA: Diagnosis present

## 2016-06-02 DIAGNOSIS — T451X5A Adverse effect of antineoplastic and immunosuppressive drugs, initial encounter: Secondary | ICD-10-CM | POA: Diagnosis present

## 2016-06-02 DIAGNOSIS — Z880 Allergy status to penicillin: Secondary | ICD-10-CM | POA: Diagnosis not present

## 2016-06-04 ENCOUNTER — Other Ambulatory Visit: Payer: Self-pay | Admitting: *Deleted

## 2016-06-04 ENCOUNTER — Telehealth: Payer: Self-pay | Admitting: *Deleted

## 2016-06-04 DIAGNOSIS — C9200 Acute myeloblastic leukemia, not having achieved remission: Secondary | ICD-10-CM

## 2016-06-04 DIAGNOSIS — D702 Other drug-induced agranulocytosis: Secondary | ICD-10-CM

## 2016-06-04 NOTE — Telephone Encounter (Signed)
Received call @ [place], from [facility] is sending over a fax. The fax is Orders for patient needing an appt for a Neulasta INJ for 12/4

## 2016-06-05 ENCOUNTER — Telehealth: Payer: Self-pay | Admitting: Hematology

## 2016-06-05 NOTE — Telephone Encounter (Signed)
lvm to inform pt of 12/1 and 12/4 appt date/times per LOS

## 2016-06-06 ENCOUNTER — Ambulatory Visit (HOSPITAL_BASED_OUTPATIENT_CLINIC_OR_DEPARTMENT_OTHER): Payer: Medicare Other

## 2016-06-06 VITALS — BP 145/74 | HR 109 | Temp 98.4°F | Resp 20

## 2016-06-06 DIAGNOSIS — Z5189 Encounter for other specified aftercare: Secondary | ICD-10-CM | POA: Diagnosis not present

## 2016-06-06 DIAGNOSIS — C9202 Acute myeloblastic leukemia, in relapse: Secondary | ICD-10-CM

## 2016-06-06 DIAGNOSIS — C9200 Acute myeloblastic leukemia, not having achieved remission: Secondary | ICD-10-CM

## 2016-06-06 MED ORDER — PEGFILGRASTIM INJECTION 6 MG/0.6ML
6.0000 mg | Freq: Once | SUBCUTANEOUS | Status: AC
  Administered 2016-06-06: 6 mg via SUBCUTANEOUS
  Filled 2016-06-06: qty 0.6

## 2016-06-06 NOTE — Patient Instructions (Signed)
Pegfilgrastim injection What is this medicine? PEGFILGRASTIM (PEG fil gra stim) is a long-acting granulocyte colony-stimulating factor that stimulates the growth of neutrophils, a type of white blood cell important in the body's fight against infection. It is used to reduce the incidence of fever and infection in patients with certain types of cancer who are receiving chemotherapy that affects the bone marrow, and to increase survival after being exposed to high doses of radiation. This medicine may be used for other purposes; ask your health care provider or pharmacist if you have questions. COMMON BRAND NAME(S): Neulasta What should I tell my health care provider before I take this medicine? They need to know if you have any of these conditions: -kidney disease -latex allergy -ongoing radiation therapy -sickle cell disease -skin reactions to acrylic adhesives (On-Body Injector only) -an unusual or allergic reaction to pegfilgrastim, filgrastim, other medicines, foods, dyes, or preservatives -pregnant or trying to get pregnant -breast-feeding How should I use this medicine? This medicine is for injection under the skin. If you get this medicine at home, you will be taught how to prepare and give the pre-filled syringe or how to use the On-body Injector. Refer to the patient Instructions for Use for detailed instructions. Use exactly as directed. Take your medicine at regular intervals. Do not take your medicine more often than directed. It is important that you put your used needles and syringes in a special sharps container. Do not put them in a trash can. If you do not have a sharps container, call your pharmacist or healthcare provider to get one. Talk to your pediatrician regarding the use of this medicine in children. While this drug may be prescribed for selected conditions, precautions do apply. Overdosage: If you think you have taken too much of this medicine contact a poison control  center or emergency room at once. NOTE: This medicine is only for you. Do not share this medicine with others. What if I miss a dose? It is important not to miss your dose. Call your doctor or health care professional if you miss your dose. If you miss a dose due to an On-body Injector failure or leakage, a new dose should be administered as soon as possible using a single prefilled syringe for manual use. What may interact with this medicine? Interactions have not been studied. Give your health care provider a list of all the medicines, herbs, non-prescription drugs, or dietary supplements you use. Also tell them if you smoke, drink alcohol, or use illegal drugs. Some items may interact with your medicine. This list may not describe all possible interactions. Give your health care provider a list of all the medicines, herbs, non-prescription drugs, or dietary supplements you use. Also tell them if you smoke, drink alcohol, or use illegal drugs. Some items may interact with your medicine. What should I watch for while using this medicine? You may need blood work done while you are taking this medicine. If you are going to need a MRI, CT scan, or other procedure, tell your doctor that you are using this medicine (On-Body Injector only). What side effects may I notice from receiving this medicine? Side effects that you should report to your doctor or health care professional as soon as possible: -allergic reactions like skin rash, itching or hives, swelling of the face, lips, or tongue -dizziness -fever -pain, redness, or irritation at site where injected -pinpoint red spots on the skin -red or dark-brown urine -shortness of breath or breathing problems -stomach or   side pain, or pain at the shoulder -swelling -tiredness -trouble passing urine or change in the amount of urine Side effects that usually do not require medical attention (report to your doctor or health care professional if they  continue or are bothersome): -bone pain -muscle pain This list may not describe all possible side effects. Call your doctor for medical advice about side effects. You may report side effects to FDA at 1-800-FDA-1088. Where should I keep my medicine? Keep out of the reach of children. Store pre-filled syringes in a refrigerator between 2 and 8 degrees C (36 and 46 degrees F). Do not freeze. Keep in carton to protect from light. Throw away this medicine if it is left out of the refrigerator for more than 48 hours. Throw away any unused medicine after the expiration date. NOTE: This sheet is a summary. It may not cover all possible information. If you have questions about this medicine, talk to your doctor, pharmacist, or health care provider.  2017 [employer] (2014-07-13 14:30:14)  

## 2016-06-09 ENCOUNTER — Ambulatory Visit: Payer: Medicare Other

## 2016-06-09 ENCOUNTER — Ambulatory Visit (HOSPITAL_BASED_OUTPATIENT_CLINIC_OR_DEPARTMENT_OTHER): Payer: Medicare Other

## 2016-06-09 ENCOUNTER — Other Ambulatory Visit (HOSPITAL_BASED_OUTPATIENT_CLINIC_OR_DEPARTMENT_OTHER): Payer: Medicare Other

## 2016-06-09 ENCOUNTER — Other Ambulatory Visit: Payer: Medicare Other

## 2016-06-09 DIAGNOSIS — C9202 Acute myeloblastic leukemia, in relapse: Secondary | ICD-10-CM | POA: Diagnosis not present

## 2016-06-09 DIAGNOSIS — Z95828 Presence of other vascular implants and grafts: Secondary | ICD-10-CM

## 2016-06-09 DIAGNOSIS — Z452 Encounter for adjustment and management of vascular access device: Secondary | ICD-10-CM | POA: Diagnosis not present

## 2016-06-09 LAB — COMPREHENSIVE METABOLIC PANEL
ALBUMIN: 3 g/dL — AB (ref 3.5–5.0)
ALK PHOS: 110 U/L (ref 40–150)
ALT: 22 U/L (ref 0–55)
AST: 12 U/L (ref 5–34)
Anion Gap: 10 mEq/L (ref 3–11)
BILIRUBIN TOTAL: 1.44 mg/dL — AB (ref 0.20–1.20)
BUN: 25.8 mg/dL (ref 7.0–26.0)
CALCIUM: 9 mg/dL (ref 8.4–10.4)
CO2: 23 mEq/L (ref 22–29)
Chloride: 108 mEq/L (ref 98–109)
Creatinine: 1.2 mg/dL — ABNORMAL HIGH (ref 0.6–1.1)
EGFR: 45 mL/min/{1.73_m2} — AB (ref >90)
Glucose: 122 mg/dl (ref 70–140)
POTASSIUM: 4.1 meq/L (ref 3.5–5.1)
Sodium: 141 mEq/L (ref 136–145)
TOTAL PROTEIN: 6.3 g/dL — AB (ref 6.4–8.3)

## 2016-06-09 LAB — CBC WITH DIFFERENTIAL/PLATELET
BASO%: 0 % (ref 0.0–2.0)
BASOS ABS: 0 10*3/uL (ref 0.0–0.1)
EOS%: 1 % (ref 0.0–7.0)
Eosinophils Absolute: 0 10*3/uL (ref 0.0–0.5)
HEMATOCRIT: 28.3 % — AB (ref 34.8–46.6)
HEMOGLOBIN: 9.5 g/dL — AB (ref 11.6–15.9)
LYMPH#: 0.2 10*3/uL — AB (ref 0.9–3.3)
LYMPH%: 7.9 % — ABNORMAL LOW (ref 14.0–49.7)
MCH: 30.6 pg (ref 25.1–34.0)
MCHC: 33.6 g/dL (ref 31.5–36.0)
MCV: 91.3 fL (ref 79.5–101.0)
MONO#: 0 10*3/uL — AB (ref 0.1–0.9)
MONO%: 0.3 % (ref 0.0–14.0)
NEUT#: 2.7 10*3/uL (ref 1.5–6.5)
NEUT%: 90.8 % — AB (ref 38.4–76.8)
NRBC: 0 % (ref 0–0)
Platelets: 19 10*3/uL — ABNORMAL LOW (ref 145–400)
RBC: 3.1 10*6/uL — ABNORMAL LOW (ref 3.70–5.45)
RDW: 16.8 % — ABNORMAL HIGH (ref 11.2–14.5)
WBC: 3 10*3/uL — ABNORMAL LOW (ref 3.9–10.3)

## 2016-06-09 MED ORDER — HEPARIN SOD (PORK) LOCK FLUSH 100 UNIT/ML IV SOLN
500.0000 [IU] | Freq: Once | INTRAVENOUS | Status: AC | PRN
  Administered 2016-06-09: 500 [IU] via INTRAVENOUS
  Filled 2016-06-09: qty 5

## 2016-06-09 MED ORDER — SODIUM CHLORIDE 0.9 % IJ SOLN
10.0000 mL | INTRAMUSCULAR | Status: DC | PRN
  Administered 2016-06-09: 10 mL via INTRAVENOUS
  Filled 2016-06-09: qty 10

## 2016-06-09 NOTE — Patient Instructions (Signed)

## 2016-06-10 ENCOUNTER — Other Ambulatory Visit: Payer: Self-pay | Admitting: *Deleted

## 2016-06-10 ENCOUNTER — Ambulatory Visit (HOSPITAL_BASED_OUTPATIENT_CLINIC_OR_DEPARTMENT_OTHER): Payer: Medicare Other

## 2016-06-10 ENCOUNTER — Ambulatory Visit (HOSPITAL_COMMUNITY)
Admission: RE | Admit: 2016-06-10 | Discharge: 2016-06-10 | Disposition: A | Discharge status: Home / Self Care | Payer: Medicare Other | Source: Ambulatory Visit | Attending: Hematology | Admitting: Hematology

## 2016-06-10 VITALS — BP 123/62 | HR 88 | Temp 98.4°F | Resp 18

## 2016-06-10 DIAGNOSIS — C9202 Acute myeloblastic leukemia, in relapse: Secondary | ICD-10-CM

## 2016-06-10 DIAGNOSIS — Z95828 Presence of other vascular implants and grafts: Secondary | ICD-10-CM

## 2016-06-10 DIAGNOSIS — C9200 Acute myeloblastic leukemia, not having achieved remission: Secondary | ICD-10-CM | POA: Diagnosis not present

## 2016-06-10 MED ORDER — ACETAMINOPHEN 325 MG PO TABS
ORAL_TABLET | ORAL | Status: AC
  Filled 2016-06-10: qty 2

## 2016-06-10 MED ORDER — HEPARIN SOD (PORK) LOCK FLUSH 100 UNIT/ML IV SOLN
250.0000 [IU] | INTRAVENOUS | Status: DC | PRN
  Administered 2016-06-10: 250 [IU]
  Filled 2016-06-10: qty 5

## 2016-06-10 MED ORDER — SODIUM CHLORIDE 0.9% FLUSH
10.0000 mL | INTRAVENOUS | Status: DC | PRN
  Administered 2016-06-10: 10 mL
  Filled 2016-06-10: qty 10

## 2016-06-10 MED ORDER — SODIUM CHLORIDE 0.9 % IV SOLN
250.0000 mL | Freq: Once | INTRAVENOUS | Status: AC
  Administered 2016-06-10: 250 mL via INTRAVENOUS

## 2016-06-10 MED ORDER — SODIUM CHLORIDE 0.9 % IJ SOLN
10.0000 mL | INTRAMUSCULAR | Status: DC | PRN
  Administered 2016-06-10: 10 mL via INTRAVENOUS
  Filled 2016-06-10: qty 10

## 2016-06-10 MED ORDER — ACETAMINOPHEN 325 MG PO TABS
650.0000 mg | ORAL_TABLET | Freq: Once | ORAL | Status: AC
  Administered 2016-06-10: 650 mg via ORAL

## 2016-06-10 NOTE — Patient Instructions (Signed)
Platelet Transfusion Introduction A platelet transfusion is a procedure in which you receive donated platelets through an IV tube. Platelets are tiny pieces of blood cells. When a blood vessel is damaged, platelets collect in the damaged area to help form a blood clot. This begins the healing process. If your platelet count gets too low, your blood may have trouble clotting. You may need a platelet transfusion if you have a condition that causes a low number of platelets (thrombocytopenia). A platelet transfusion may be used to stop or prevent bleeding. Tell a health care provider about:  Any allergies you have.  All medicines you are taking, including vitamins, herbs, eye drops, creams, and over-the-counter medicines.  Any problems you or family members have had with anesthetic medicines.  Any blood disorders you have.  Any surgeries you have had.  Any medical conditions you have.  Any reactions you have had during a previous transfusion. What are the risks? Generally, this is a safe procedure. However, problems may occur, including:  Fever with or without chills. The fever usually occurs within the first 4 hours of the transfusion and returns to normal within 48 hours.  Allergic reaction. The reaction is most commonly caused by antibodies your body creates against substances in the transfusion. Signs of an allergic reaction may include itching, hives, difficulty breathing, shock, or low blood pressure.  Sudden (acute) or delayed hemolytic reaction. This rare reaction can occur during the transfusion and up to 28 days after the transfusion. The reaction usually occurs when your body's defense system (immune system) attacks the new platelets. Signs of a hemolytic reaction may include fever, headache, difficulty breathing, low blood pressure, a rapid heartbeat, or pain in your back, abdomen, chest, or IV site.  Transfusion-related acute lung injury (TRALI). TRALI can occur within hours of  a transfusion, or several days later. This is a rare reaction that causes lung damage. The cause is not known.  Infection. Signs of this rare complication may include fever, chills, vomiting, a rapid heartbeat, or low blood pressure. What happens before the procedure?  You may have a blood test to determine your blood type. This is necessary to find out what kind ofplatelets best matches your platelets.  If you have had an allergic reaction to a transfusion in the past, you may be given medicine to help prevent a reaction. Take this medicine only as directed by your health care provider.  Your temperature, blood pressure, and pulse will be monitored before the transfusion. What happens during the procedure?  An IV will be started in your hand or arm.  The transfusion will be attached to your IV tubing. The bag of donated platelets will be attached to your IV tube andgiven into your vein.  Your temperature, blood pressure, and pulse will be monitored regularly during the transfusion. This monitoring is done to help detect early signs of a transfusion reaction.  If you have any signs or symptoms of a reaction, your transfusion will be stopped and you may be given medicine.  When your transfusion is complete, your IV will be removed.  Pressure may be applied to the IV site for a few minutes.  A bandage (dressing) will be applied. The procedure may vary among health care providers and hospitals. What happens after the procedure?  Your blood pressure, temperature, and pulse will be monitored regularly. This information is not intended to replace advice given to you by your health care provider. Make sure you discuss any questions you have  with your health care provider. Document Released: 04/20/2007 Document Revised: 11/29/2015 Document Reviewed: 05/03/2014  2017 [employer]vier

## 2016-06-10 NOTE — Addendum Note (Signed)
Addended byArna Snipe on: 06/10/2016 12:19 PM   Modules accepted: Orders

## 2016-06-11 LAB — PREPARE PLATELET PHERESIS: UNIT DIVISION: 0

## 2016-06-12 ENCOUNTER — Other Ambulatory Visit: Payer: Self-pay | Admitting: *Deleted

## 2016-06-12 ENCOUNTER — Ambulatory Visit: Payer: Medicare Other

## 2016-06-12 ENCOUNTER — Ambulatory Visit (HOSPITAL_BASED_OUTPATIENT_CLINIC_OR_DEPARTMENT_OTHER): Payer: Medicare Other

## 2016-06-12 ENCOUNTER — Other Ambulatory Visit (HOSPITAL_BASED_OUTPATIENT_CLINIC_OR_DEPARTMENT_OTHER): Payer: Medicare Other

## 2016-06-12 DIAGNOSIS — C9200 Acute myeloblastic leukemia, not having achieved remission: Secondary | ICD-10-CM

## 2016-06-12 DIAGNOSIS — C9202 Acute myeloblastic leukemia, in relapse: Secondary | ICD-10-CM

## 2016-06-12 DIAGNOSIS — Z95828 Presence of other vascular implants and grafts: Secondary | ICD-10-CM

## 2016-06-12 LAB — CBC WITH DIFFERENTIAL/PLATELET
BASO%: 0 % (ref 0.0–2.0)
Basophils Absolute: 0 10e3/uL (ref 0.0–0.1)
EOS%: 7.1 % — ABNORMAL HIGH (ref 0.0–7.0)
Eosinophils Absolute: 0 10e3/uL (ref 0.0–0.5)
HCT: 25.9 % — ABNORMAL LOW (ref 34.8–46.6)
HGB: 8.7 g/dL — ABNORMAL LOW (ref 11.6–15.9)
LYMPH%: 85.7 % — ABNORMAL HIGH (ref 14.0–49.7)
MCH: 30.5 pg (ref 25.1–34.0)
MCHC: 33.6 g/dL (ref 31.5–36.0)
MCV: 90.9 fL (ref 79.5–101.0)
MONO#: 0 10e3/uL — ABNORMAL LOW (ref 0.1–0.9)
MONO%: 0 % (ref 0.0–14.0)
NEUT#: 0 10e3/uL — CL (ref 1.5–6.5)
NEUT%: 7.2 % — ABNORMAL LOW (ref 38.4–76.8)
Platelets: 12 10e3/uL — ABNORMAL LOW (ref 145–400)
RBC: 2.85 10e6/uL — ABNORMAL LOW (ref 3.70–5.45)
RDW: 15.8 % — ABNORMAL HIGH (ref 11.2–14.5)
WBC: 0.3 10e3/uL — CL (ref 3.9–10.3)
lymph#: 0.2 10e3/uL — ABNORMAL LOW (ref 0.9–3.3)
nRBC: 0 % (ref 0–0)

## 2016-06-12 LAB — PREPARE RBC (CROSSMATCH)

## 2016-06-12 MED ORDER — ACETAMINOPHEN 325 MG PO TABS: 650.0000 mg | ORAL_TABLET | Freq: Once | ORAL | Status: DC

## 2016-06-12 MED ORDER — SODIUM CHLORIDE 0.9% FLUSH
10.0000 mL | INTRAVENOUS | Status: AC | PRN
  Administered 2016-06-12: 10 mL
  Filled 2016-06-12: qty 10

## 2016-06-12 MED ORDER — HEPARIN SOD (PORK) LOCK FLUSH 100 UNIT/ML IV SOLN
500.0000 [IU] | Freq: Once | INTRAVENOUS | Status: DC | PRN
  Filled 2016-06-12: qty 5

## 2016-06-12 MED ORDER — ACETAMINOPHEN 325 MG PO TABS
650.0000 mg | ORAL_TABLET | Freq: Once | ORAL | Status: AC
  Administered 2016-06-12: 650 mg via ORAL

## 2016-06-12 MED ORDER — SODIUM CHLORIDE 0.9 % IV SOLN: 250.0000 mL | Freq: Once | INTRAVENOUS | Status: DC

## 2016-06-12 MED ORDER — SODIUM CHLORIDE 0.9 % IJ SOLN
10.0000 mL | INTRAMUSCULAR | Status: DC | PRN
  Administered 2016-06-12: 10 mL via INTRAVENOUS
  Filled 2016-06-12: qty 10

## 2016-06-12 MED ORDER — ACETAMINOPHEN 325 MG PO TABS
ORAL_TABLET | ORAL | Status: AC
  Filled 2016-06-12: qty 2

## 2016-06-12 MED ORDER — HEPARIN SOD (PORK) LOCK FLUSH 100 UNIT/ML IV SOLN
500.0000 [IU] | Freq: Every day | INTRAVENOUS | Status: AC | PRN
  Administered 2016-06-12: 500 [IU]
  Filled 2016-06-12: qty 5

## 2016-06-12 NOTE — Patient Instructions (Signed)

## 2016-06-12 NOTE — Patient Instructions (Addendum)
Platelet Transfusion Introduction A platelet transfusion is a procedure in which you receive donated platelets through an IV tube. Platelets are tiny pieces of blood cells. When a blood vessel is damaged, platelets collect in the damaged area to help form a blood clot. This begins the healing process. If your platelet count gets too low, your blood may have trouble clotting. You may need a platelet transfusion if you have a condition that causes a low number of platelets (thrombocytopenia). A platelet transfusion may be used to stop or prevent bleeding. Tell a health care provider about:  Any allergies you have.  All medicines you are taking, including vitamins, herbs, eye drops, creams, and over-the-counter medicines.  Any problems you or family members have had with anesthetic medicines.  Any blood disorders you have.  Any surgeries you have had.  Any medical conditions you have.  Any reactions you have had during a previous transfusion. What are the risks? Generally, this is a safe procedure. However, problems may occur, including:  Fever with or without chills. The fever usually occurs within the first 4 hours of the transfusion and returns to normal within 48 hours.  Allergic reaction. The reaction is most commonly caused by antibodies your body creates against substances in the transfusion. Signs of an allergic reaction may include itching, hives, difficulty breathing, shock, or low blood pressure.  Sudden (acute) or delayed hemolytic reaction. This rare reaction can occur during the transfusion and up to 28 days after the transfusion. The reaction usually occurs when your body's defense system (immune system) attacks the new platelets. Signs of a hemolytic reaction may include fever, headache, difficulty breathing, low blood pressure, a rapid heartbeat, or pain in your back, abdomen, chest, or IV site.  Transfusion-related acute lung injury (TRALI). TRALI can occur within hours of  a transfusion, or several days later. This is a rare reaction that causes lung damage. The cause is not known.  Infection. Signs of this rare complication may include fever, chills, vomiting, a rapid heartbeat, or low blood pressure. What happens before the procedure?  You may have a blood test to determine your blood type. This is necessary to find out what kind ofplatelets best matches your platelets.  If you have had an allergic reaction to a transfusion in the past, you may be given medicine to help prevent a reaction. Take this medicine only as directed by your health care provider.  Your temperature, blood pressure, and pulse will be monitored before the transfusion. What happens during the procedure?  An IV will be started in your hand or arm.  The transfusion will be attached to your IV tubing. The bag of donated platelets will be attached to your IV tube andgiven into your vein.  Your temperature, blood pressure, and pulse will be monitored regularly during the transfusion. This monitoring is done to help detect early signs of a transfusion reaction.  If you have any signs or symptoms of a reaction, your transfusion will be stopped and you may be given medicine.  When your transfusion is complete, your IV will be removed.  Pressure may be applied to the IV site for a few minutes.  A bandage (dressing) will be applied. The procedure may vary among health care providers and hospitals. What happens after the procedure?  Your blood pressure, temperature, and pulse will be monitored regularly. This information is not intended to replace advice given to you by your health care provider. Make sure you discuss any questions you have  with your health care provider. Document Released: 04/20/2007 Document Revised: 11/29/2015 Document Reviewed: 05/03/2014  2017 Elsevier     Neutropenia Introduction Neutropenia is a condition that occurs when you have a lower-than-normal level of  a type of white blood cell (neutrophil) in your body. Neutrophils are made in the spongy center of large bones (bone marrow) and they fight infections. Neutrophils are your body's main defense against bacterial and fungal infections. The fewer neutrophils you have and the longer your body remains without them, the greater your risk of getting a severe infection. What are the causes? This condition can occur if your body uses up or destroys neutrophils faster than your bone marrow can make them. This problem may happen because of:  Bacterial or fungal infection.  Allergic disorders.  Reactions to some medicines.  Autoimmune disease.  An enlarged spleen. This condition can also occur if your bone marrow does not produce enough neutrophils. This problem may be caused by:  Cancer.  Cancer treatments, such as radiation or chemotherapy.  Viral infections.  Medicines, such as phenytoin.  Vitamin B12 deficiency.  Diseases of the bone marrow.  Environmental toxins, such as insecticides. What are the signs or symptoms? This condition does not usually cause symptoms. If symptoms are present, they are usually caused by an underlying infection. Symptoms of an infection may include:  Fever.  Chills.  Swollen glands.  Oral or anal ulcers.  Cough and shortness of breath.  Rash.  Skin infection.  Fatigue. How is this diagnosed? Your health care provider may suspect neutropenia if you have:  A condition that may cause neutropenia.  Symptoms of infection, especially fever.  Frequent and unusual infections. You will have a medical history and physical exam. Tests will also be done, such as:  A complete blood count (CBC).  A procedure to collect a sample of bone marrow for examination (bone marrow biopsy).  A chest X-ray.  A urine culture.  A blood culture. How is this treated? Treatment depends on the underlying cause and severity of your condition. Mild neutropenia may  not require treatment. Treatment may include medicines, such as:  Antibiotic medicine given through an IV tube.  Antiviral medicines.  Antifungal medicines.  A medicine to increase neutrophil production (colony-stimulating factor). You may get this drug through an IV tube or by injection.  Steroids given through an IV tube. If an underlying condition is causing neutropenia, you may need treatment for that condition. If medicines you are taking are causing neutropenia, your health care provider may have you stop taking those medicines. Follow these instructions at home: Medicines  Take over-the-counter and prescription medicines only as told by your health care provider.  Get a seasonal flu shot (influenza vaccine). Lifestyle  Do not eat unpasteurized foods.Do not eat unwashed raw fruits or vegetables.  Avoid exposure to groups of people or children.  Avoid being around people who are sick.  Avoid being around dirt or dust, such as in construction areas or gardens.  Do not provide direct care for pets. Avoid animal droppings. Do not clean litter boxes and bird cages. Hygiene   Bathe daily.  Clean the area between the genitals and the anus (perineal area) after you urinate or have a bowel movement. If you are female, wipe from front to back.  Brush your teeth with a soft toothbrush before and after meals.  Do not use a razor that has a blade. Use an electric razor to remove hair.  Wash your hands often.  Make sure others who come in contact with you also wash their hands. If soap and water are not available, use hand sanitizer. General instructions  Do not have sex unless your health care provider has approved.  Take actions to avoid cuts and burns. For example:  Be cautious when you use knives. Always cut away from yourself.  Keep knives in protective sheaths or guards when not in use.  Use oven mitts when you cook with a hot stove, oven, or grill.  Stand a safe  distance away from open fires.  Avoid people who received a vaccine in the past 30 days if that vaccine contained a live version of the germ (live vaccine). You should not get a live vaccine. Common live vaccines are varicella, measles, mumps, and rubella.  Do not share food utensils.  Do not use tampons, enemas, or rectal suppositories unless your health care provider has approved.  Keep all appointments as told by your health care provider. This is important. Contact a health care provider if:  You have a fever.  You have chills or you start to shake.  You have:  A sore throat.  A warm, red, or tender area on your skin.  A cough.  Frequent or painful urination.  Vaginal discharge or itching.  You develop:  Sores in your mouth or anus.  Swollen lymph nodes.  Red streaks on the skin.  A rash.  You feel:  Nauseous or you vomit.  Very fatigued.  Short of breath. This information is not intended to replace advice given to you by your health care provider. Make sure you discuss any questions you have with your health care provider. Document Released: 12/13/2001 Document Revised: 11/29/2015 Document Reviewed: 01/03/2015  2017 Elsevier    Food Safety for the Immunocompromised Person Introduction If you are immunocompromised, it is important to follow food safety guidelines. Bacteria and other harmful germs are more likely to be in raw or fresh foods. Thoroughly cooking foods destroys these germs. Fresh vegetables should be cooked until tender; meats should be cooked until well-done; and eggs should be cooked until the yolks are firm. Dairy products, juices, and ciders should have the word "pasteurized" on the label. The following information can help you choose the right foods and prepare them correctly in order to keep you healthy. What do I need to know about food safety? Wash your hands with soap and water before and after preparing food. Always wash your hands  after touching raw meat. Wash any surfaces that you will be using to prepare food. Use hot, soapy water. Keep foods separate when you are preparing and cooking a meal. Do not use the same knife or cutting board to cut your fresh produce and raw meat. Cook food to the right temperature: Beef, pork, veal, lamb, and steak should be cooked to 145F (63C) with a 3-minute rest time. Fish should be cooked to 145F. Ground beef, pork, veal, and lamb should be cooked to 160F (71C). Egg dishes should be cooked to 160F. Poultry (whole, pieces, and ground) should be cooked to 165F (74C). Put any leftovers in the refrigerator as soon as possible to stop bacteria from growing and to keep your food from going bad. Hot foods should be kept at 162F (60C) or more, and cold foods should be kept at 62F (4.4C) or less. Preparation guidelines Cooking and Eating Utensil Preparation Wash the following with soap and hot water before and after use: Countertops. Contractor. Cooking utensils. Silverware.  Flatware. Pots and pans. Dishes. Glassware. Air dry all cooking and eating utensils. Do not dry them with a cloth towel. Food Preparation Do not buy food that has passed the expiration or "use by" date. Wash your hands often for at least 20 seconds with warm, soapy water and dry them with paper towels. This is especially important after you have touched raw meat, eggs, or fish. Wash fruits and vegetables thoroughly under cold running water before peeling or cutting them. Individually scrub produce that has a thick, rough skin or rind, such as cabbage. Do not use commercial rinses to wash fruits and vegetables. Rinse packaged salads, slaw mix, and other prepared produce under cold running water. Do this even if the food is labeled "prewashed." Thaw frozen foods in the refrigerator overnight or quickly in the microwave. Do not thaw food on countertops. Do not touch or use raw yeast. There is a risk that  you could breathe it in. Raw yeast is used to make bread. Cook all perishable foods thoroughly. Do not leave easily spoiled items at room temperature for more than 10-15 minutes. Refrigerate leftovers as soon as possible in small, airtight, shallow containers. Eat leftovers only if they have been stored properly. Do not eat leftovers that have been around for longer than 24 hours. Boil marinades before using them on raw foods. Clean the outside of your canned goods before opening them. Do not put cooked food on a surface that you had placed raw meat, fish, or eggs on. Those surfaces must be washed with warm, soapy water before you use them again. Always place raw meat, seafood, and eggs in plastic bags before putting them in your shopping cart at the grocery story. Also, bag those items separately and not with other food you have purchased. Once home, place them in your refrigerator right away. What foods can I not eat? Grains Fresh bakery breads, muffins, cakes, donuts, and cream- or custard-filled cakes. Raw or uncooked grain products. Beer that has "unpasteurized" on the label, is made with uncooked brewer's yeast, is homemade or home brewed, or is from a microbrewery. Vegetables Unwashedraw vegetables and salads. Unpasteurized vegetable juice. Raw vegetable sprouts, such as alfalfa, radish, broccoli, and mung bean. Salads from the deli or salad bar. Fruits Unwashed raw fruit. Unpasteurized fruit juices. Fresh apple cider. Meat and Other Protein Sources All raw, uncooked, undercooked, or rare meat, fish, eggs, poultry, or tofu. This includes: Sushi. Partially cooked seafood, such as shrimp and crab. Raw shellfish, such as oysters, clams, mussels, and scallops and their juices. Refrigerated smoked seafood, including smoked salmon and lox. Unpasteurized, refrigerated pates or meat spreads. Unheated cold cuts from the deli, including hot dogs, dry or fermented sausage, or other deli  meat. These are okay if you heat them until they are steaming or reach 165F. Hard cured salami in natural wrap. Any meat, poultry, or seafood salad made at the grocery store or at [employer]. Pickled fish. Any fermented foods such as tempeh or miso products. Unprocessed nuts, unroasted raw nuts, and roasted nuts in the shell. Food products made with raw or undercooked eggs, such as Caesar salad dressing, mayonnaise, homemade cookie dough, cake batters, and eggnog. While most products at the grocery store are made with pasteurized eggs, you still need to read the label to make sure. Do not eat anything that has the word "unpasteurized" on the label. Dairy Soft cheeses made from unpasteurized milk or molds (such as feta, Brie, Camembert, and Gorgonzola), blue-veined cheese (  such as Stilton and Roquefort), Mexican-style cheeses (such as [place]), and farmer's cheese. Unpasteurized or raw milk cheese, yogurt, and other milk products. Cheeses containing chili peppers or other uncooked vegetables. Any imported cheeses. Any cheese sliced at a deli. Beverages Unboiled well water. Cold-brewed tea or "sun teas" made with warm or cold water. Mate tea or yerba mate tea. Raw, unpasteurized milk. Eggnog or milkshakes made with raw eggs. Unpasteurized fruit and vegetable juices. Fresh apple cider. Wine or beer that is unpasteurized, homemade or home brewed, or from a microbrewery. Condiments Uncooked herbs or spices. Raw or unpasteurized honey. Prepackaged salsas stored in a refrigerated case. Sweets/Desserts Unrefrigerated custard or cream-filled pastry products. Soft-serve ice cream or frozen yogurt. Hand-packed ice cream or frozen yogurt. Fats Fresh salad dressings containing raw eggs or aged cheese (such as blue cheese and Roquefort) stored in a refrigerated case. This information is not intended to replace advice given to you by your health care provider. Make sure you discuss any  questions you have with your health care provider. Document Released: 04/20/2007 Document Revised: 11/29/2015 Document Reviewed: 11/22/2013  2017 Elsevier

## 2016-06-12 NOTE — Progress Notes (Signed)
Pt requested to remain accessed until after lab resulted. Pt is concerned she may need a transfusion today. Advised pt to return to the flush room to be de accessed if no transfusion is ordered. Pt verbalized an understanding.

## 2016-06-13 ENCOUNTER — Other Ambulatory Visit: Payer: Self-pay | Admitting: *Deleted

## 2016-06-13 ENCOUNTER — Ambulatory Visit (HOSPITAL_COMMUNITY)
Admission: RE | Admit: 2016-06-13 | Discharge: 2016-06-13 | Disposition: A | Discharge status: Home / Self Care | Payer: Medicare Other | Source: Ambulatory Visit | Attending: Hematology | Admitting: Hematology

## 2016-06-13 DIAGNOSIS — C9200 Acute myeloblastic leukemia, not having achieved remission: Secondary | ICD-10-CM | POA: Diagnosis not present

## 2016-06-13 LAB — PREPARE PLATELET PHERESIS: Unit division: 0

## 2016-06-13 MED ORDER — ACETAMINOPHEN 325 MG PO TABS
650.0000 mg | ORAL_TABLET | Freq: Once | ORAL | Status: AC
  Administered 2016-06-13: 650 mg via ORAL
  Filled 2016-06-13: qty 2

## 2016-06-13 MED ORDER — SODIUM CHLORIDE 0.9% FLUSH: 3.0000 mL | INTRAVENOUS | Status: DC | PRN

## 2016-06-13 MED ORDER — SODIUM CHLORIDE 0.9% FLUSH
10.0000 mL | INTRAVENOUS | Status: AC | PRN
  Administered 2016-06-13: 10 mL

## 2016-06-13 MED ORDER — HEPARIN SOD (PORK) LOCK FLUSH 100 UNIT/ML IV SOLN
500.0000 [IU] | Freq: Every day | INTRAVENOUS | Status: DC | PRN
  Filled 2016-06-13: qty 5

## 2016-06-13 MED ORDER — HEPARIN SOD (PORK) LOCK FLUSH 100 UNIT/ML IV SOLN: 250.0000 [IU] | INTRAVENOUS | Status: DC | PRN

## 2016-06-13 MED ORDER — SODIUM CHLORIDE 0.9 % IV SOLN
250.0000 mL | Freq: Once | INTRAVENOUS | Status: AC
  Administered 2016-06-13: 250 mL via INTRAVENOUS

## 2016-06-13 NOTE — Discharge Instructions (Signed)
Blood Transfusion, Adult, Care After This sheet gives you information about how to care for yourself after your procedure. Your health care provider may also give you more specific instructions. If you have problems or questions, contact your health care provider. What can I expect after the procedure? After your procedure, it is common to have:  Bruising and soreness where the IV tube was inserted.  Headache. Follow these instructions at home:  Take over-the-counter and prescription medicines only as told by your health care provider.  Return to your normal activities as told by your health care provider.  Follow instructions from your health care provider about how to take care of your IV insertion site. Make sure you:  Wash your hands with soap and water before you change your bandage (dressing). If soap and water are not available, use hand sanitizer.  Change your dressing as told by your health care provider.  Check your IV insertion site every day for signs of infection. Check for:  More redness, swelling, or pain.  More fluid or blood.  Warmth.  Pus or a bad smell. Contact a health care provider if:  You have more redness, swelling, or pain around the IV insertion site.  You have more fluid or blood coming from the IV insertion site.  Your IV insertion site feels warm to the touch.  You have pus or a bad smell coming from the IV insertion site.  Your urine turns pink, red, or brown.  You feel weak after doing your normal activities. Get help right away if:  You have signs of a serious allergic or immune system reaction, including:  Itchiness.  Hives.  Trouble breathing.  Anxiety.  Chest or lower back pain.  Fever, flushing, and chills.  Rapid pulse.  Rash.  Diarrhea.  Vomiting.  Dark urine.  Serious headache.  Dizziness.  Stiff neck.  Yellow coloration of the face or the white parts of the eyes (jaundice). This information is not  intended to replace advice given to you by your health care provider. Make sure you discuss any questions you have with your health care provider. Document Released: 07/14/2014 Document Revised: 02/20/2016 Document Reviewed: 01/07/2016 Elsevier Interactive Patient Education  2017 Elsevier Inc.  

## 2016-06-13 NOTE — Progress Notes (Signed)
Patient ID: Miranda Mcguire, female   DOB: August 10, 1945, 71 y.o.   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   Patient tolerated procedure well. No reaction. Went over discharge instructions and copy given to patient. Alert, oriented and ambulatory at discharge. Voiding. Discharged to home with family member.

## 2016-06-16 ENCOUNTER — Ambulatory Visit (HOSPITAL_COMMUNITY)
Admission: RE | Admit: 2016-06-16 | Discharge: 2016-06-16 | Disposition: A | Discharge status: Home / Self Care | Payer: Medicare Other | Source: Ambulatory Visit | Attending: Hematology | Admitting: Hematology

## 2016-06-16 ENCOUNTER — Other Ambulatory Visit (HOSPITAL_BASED_OUTPATIENT_CLINIC_OR_DEPARTMENT_OTHER): Payer: Medicare Other

## 2016-06-16 ENCOUNTER — Ambulatory Visit (HOSPITAL_BASED_OUTPATIENT_CLINIC_OR_DEPARTMENT_OTHER): Payer: Medicare Other

## 2016-06-16 ENCOUNTER — Other Ambulatory Visit: Payer: Self-pay | Admitting: Hematology

## 2016-06-16 VITALS — BP 138/67 | HR 89 | Temp 98.6°F | Resp 20

## 2016-06-16 DIAGNOSIS — C9202 Acute myeloblastic leukemia, in relapse: Secondary | ICD-10-CM

## 2016-06-16 DIAGNOSIS — C9200 Acute myeloblastic leukemia, not having achieved remission: Secondary | ICD-10-CM

## 2016-06-16 DIAGNOSIS — Z452 Encounter for adjustment and management of vascular access device: Secondary | ICD-10-CM

## 2016-06-16 DIAGNOSIS — Z95828 Presence of other vascular implants and grafts: Secondary | ICD-10-CM

## 2016-06-16 LAB — CBC WITH DIFFERENTIAL/PLATELET
BASO%: 0.4 % (ref 0.0–2.0)
Basophils Absolute: 0 10*3/uL (ref 0.0–0.1)
EOS%: 1.5 % (ref 0.0–7.0)
Eosinophils Absolute: 0 10*3/uL (ref 0.0–0.5)
HEMATOCRIT: 34.5 % — AB (ref 34.8–46.6)
HGB: 11.2 g/dL — ABNORMAL LOW (ref 11.6–15.9)
LYMPH#: 0.3 10*3/uL — AB (ref 0.9–3.3)
LYMPH%: 16.7 % (ref 14.0–49.7)
MCH: 29.2 pg (ref 25.1–34.0)
MCHC: 32.5 g/dL (ref 31.5–36.0)
MCV: 89.7 fL (ref 79.5–101.0)
MONO#: 0.3 10*3/uL (ref 0.1–0.9)
MONO%: 16.5 % — ABNORMAL HIGH (ref 0.0–14.0)
NEUT%: 64.9 % (ref 38.4–76.8)
NEUTROS ABS: 1 10*3/uL — AB (ref 1.5–6.5)
Platelets: 13 10*3/uL — ABNORMAL LOW (ref 145–400)
RBC: 3.84 10*6/uL (ref 3.70–5.45)
RDW: 16.7 % — ABNORMAL HIGH (ref 11.2–14.5)
WBC: 1.6 10*3/uL — AB (ref 3.9–10.3)

## 2016-06-16 LAB — TYPE AND SCREEN
BLOOD PRODUCT EXPIRATION DATE: 201712192359
Blood Product Expiration Date: 201712162359
ISSUE DATE / TIME: 201712080855
ISSUE DATE / TIME: 201712080855
Unit Type and Rh: 6200
Unit Type and Rh: 6200

## 2016-06-16 MED ORDER — SODIUM CHLORIDE 0.9% FLUSH: 3.0000 mL | INTRAVENOUS | Status: DC | PRN

## 2016-06-16 MED ORDER — SODIUM CHLORIDE 0.9 % IJ SOLN
10.0000 mL | INTRAMUSCULAR | Status: DC | PRN
  Administered 2016-06-16: 10 mL via INTRAVENOUS
  Filled 2016-06-16: qty 10

## 2016-06-16 MED ORDER — HEPARIN SOD (PORK) LOCK FLUSH 100 UNIT/ML IV SOLN
500.0000 [IU] | Freq: Once | INTRAVENOUS | Status: AC | PRN
  Administered 2016-06-16: 500 [IU] via INTRAVENOUS
  Filled 2016-06-16: qty 5

## 2016-06-16 MED ORDER — SODIUM CHLORIDE 0.9% FLUSH
10.0000 mL | INTRAVENOUS | Status: AC | PRN
  Administered 2016-06-16: 10 mL

## 2016-06-16 MED ORDER — HEPARIN SOD (PORK) LOCK FLUSH 100 UNIT/ML IV SOLN
500.0000 [IU] | Freq: Every day | INTRAVENOUS | Status: AC | PRN
  Administered 2016-06-16: 500 [IU]
  Filled 2016-06-16: qty 5

## 2016-06-16 MED ORDER — SODIUM CHLORIDE 0.9 % IV SOLN
250.0000 mL | Freq: Once | INTRAVENOUS | Status: AC
  Administered 2016-06-16: 250 mL via INTRAVENOUS

## 2016-06-16 MED ORDER — ACETAMINOPHEN 325 MG PO TABS
650.0000 mg | ORAL_TABLET | Freq: Once | ORAL | Status: AC
  Administered 2016-06-16: 650 mg via ORAL
  Filled 2016-06-16: qty 2

## 2016-06-16 MED ORDER — HEPARIN SOD (PORK) LOCK FLUSH 100 UNIT/ML IV SOLN: 250.0000 [IU] | INTRAVENOUS | Status: DC | PRN

## 2016-06-16 NOTE — Discharge Instructions (Signed)

## 2016-06-16 NOTE — Progress Notes (Signed)
Patient ID: Miranda Mcguire, female   DOB: 15-Jan-1946, 70 y.o.   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 No reaction. Alert, oriented and ambulatory at time of discharge.

## 2016-06-17 LAB — PREPARE PLATELET PHERESIS: UNIT DIVISION: 0

## 2016-06-19 ENCOUNTER — Other Ambulatory Visit: Payer: Self-pay | Admitting: *Deleted

## 2016-06-19 ENCOUNTER — Other Ambulatory Visit (HOSPITAL_BASED_OUTPATIENT_CLINIC_OR_DEPARTMENT_OTHER): Payer: Medicare Other

## 2016-06-19 ENCOUNTER — Ambulatory Visit (HOSPITAL_BASED_OUTPATIENT_CLINIC_OR_DEPARTMENT_OTHER): Payer: Medicare Other

## 2016-06-19 DIAGNOSIS — C9200 Acute myeloblastic leukemia, not having achieved remission: Secondary | ICD-10-CM | POA: Diagnosis not present

## 2016-06-19 DIAGNOSIS — C9202 Acute myeloblastic leukemia, in relapse: Secondary | ICD-10-CM | POA: Diagnosis not present

## 2016-06-19 LAB — CBC WITH DIFFERENTIAL/PLATELET
BASO%: 0.2 % (ref 0.0–2.0)
Basophils Absolute: 0 10*3/uL (ref 0.0–0.1)
EOS%: 0.3 % (ref 0.0–7.0)
Eosinophils Absolute: 0 10*3/uL (ref 0.0–0.5)
HCT: 33.3 % — ABNORMAL LOW (ref 34.8–46.6)
HEMOGLOBIN: 10.9 g/dL — AB (ref 11.6–15.9)
LYMPH%: 7.7 % — ABNORMAL LOW (ref 14.0–49.7)
MCH: 29 pg (ref 25.1–34.0)
MCHC: 32.8 g/dL (ref 31.5–36.0)
MCV: 88.3 fL (ref 79.5–101.0)
MONO#: 0.5 10*3/uL (ref 0.1–0.9)
MONO%: 18.7 % — AB (ref 0.0–14.0)
NEUT%: 73.1 % (ref 38.4–76.8)
NEUTROS ABS: 2.1 10*3/uL (ref 1.5–6.5)
Platelets: 17 10*3/uL — ABNORMAL LOW (ref 145–400)
RBC: 3.77 10*6/uL (ref 3.70–5.45)
RDW: 15.9 % — AB (ref 11.2–14.5)
WBC: 2.9 10*3/uL — AB (ref 3.9–10.3)
lymph#: 0.2 10*3/uL — ABNORMAL LOW (ref 0.9–3.3)

## 2016-06-19 MED ORDER — ACETAMINOPHEN 325 MG PO TABS
ORAL_TABLET | ORAL | Status: AC
  Filled 2016-06-19: qty 2

## 2016-06-19 MED ORDER — ACETAMINOPHEN 325 MG PO TABS
650.0000 mg | ORAL_TABLET | Freq: Once | ORAL | Status: AC
  Administered 2016-06-19: 650 mg via ORAL

## 2016-06-19 MED ORDER — SODIUM CHLORIDE 0.9 % IV SOLN
250.0000 mL | Freq: Once | INTRAVENOUS | Status: AC
  Administered 2016-06-19: 250 mL via INTRAVENOUS

## 2016-06-19 MED ORDER — HEPARIN SOD (PORK) LOCK FLUSH 100 UNIT/ML IV SOLN
500.0000 [IU] | Freq: Every day | INTRAVENOUS | Status: AC | PRN
  Administered 2016-06-19: 500 [IU]
  Filled 2016-06-19: qty 5

## 2016-06-19 MED ORDER — SODIUM CHLORIDE 0.9% FLUSH
10.0000 mL | INTRAVENOUS | Status: AC | PRN
  Administered 2016-06-19: 10 mL
  Filled 2016-06-19: qty 10

## 2016-06-19 MED ORDER — HEPARIN SOD (PORK) LOCK FLUSH 100 UNIT/ML IV SOLN
500.0000 [IU] | Freq: Every day | INTRAVENOUS | Status: DC | PRN
  Filled 2016-06-19: qty 5

## 2016-06-19 NOTE — Patient Instructions (Signed)

## 2016-06-20 LAB — PREPARE PLATELET PHERESIS: UNIT DIVISION: 0

## 2016-06-23 ENCOUNTER — Ambulatory Visit (HOSPITAL_BASED_OUTPATIENT_CLINIC_OR_DEPARTMENT_OTHER): Payer: Medicare Other

## 2016-06-23 ENCOUNTER — Other Ambulatory Visit: Payer: Self-pay | Admitting: *Deleted

## 2016-06-23 ENCOUNTER — Ambulatory Visit: Payer: Medicare Other

## 2016-06-23 ENCOUNTER — Other Ambulatory Visit (HOSPITAL_BASED_OUTPATIENT_CLINIC_OR_DEPARTMENT_OTHER): Payer: Medicare Other

## 2016-06-23 DIAGNOSIS — C9202 Acute myeloblastic leukemia, in relapse: Secondary | ICD-10-CM

## 2016-06-23 DIAGNOSIS — C9200 Acute myeloblastic leukemia, not having achieved remission: Secondary | ICD-10-CM

## 2016-06-23 DIAGNOSIS — Z95828 Presence of other vascular implants and grafts: Secondary | ICD-10-CM

## 2016-06-23 LAB — CBC WITH DIFFERENTIAL/PLATELET
BASO%: 0.2 % (ref 0.0–2.0)
BASOS ABS: 0 10*3/uL (ref 0.0–0.1)
EOS ABS: 0 10*3/uL (ref 0.0–0.5)
EOS%: 0.2 % (ref 0.0–7.0)
HCT: 29.3 % — ABNORMAL LOW (ref 34.8–46.6)
HGB: 10 g/dL — ABNORMAL LOW (ref 11.6–15.9)
LYMPH%: 41.3 % (ref 14.0–49.7)
MCH: 29.4 pg (ref 25.1–34.0)
MCHC: 34.1 g/dL (ref 31.5–36.0)
MCV: 86.2 fL (ref 79.5–101.0)
MONO#: 0.9 10*3/uL (ref 0.1–0.9)
MONO%: 20.1 % — ABNORMAL HIGH (ref 0.0–14.0)
NEUT#: 1.7 10*3/uL (ref 1.5–6.5)
NEUT%: 38.2 % — ABNORMAL LOW (ref 38.4–76.8)
PLATELETS: 16 10*3/uL — AB (ref 145–400)
RBC: 3.4 10*6/uL — AB (ref 3.70–5.45)
RDW: 14.9 % — AB (ref 11.2–14.5)
WBC: 4.4 10*3/uL (ref 3.9–10.3)
lymph#: 1.8 10*3/uL (ref 0.9–3.3)
nRBC: 0 % (ref 0–0)

## 2016-06-23 MED ORDER — ACETAMINOPHEN 325 MG PO TABS
650.0000 mg | ORAL_TABLET | Freq: Once | ORAL | Status: AC
  Administered 2016-06-23: 650 mg via ORAL

## 2016-06-23 MED ORDER — ACETAMINOPHEN 325 MG PO TABS
ORAL_TABLET | ORAL | Status: AC
  Filled 2016-06-23: qty 2

## 2016-06-23 MED ORDER — SODIUM CHLORIDE 0.9% FLUSH
10.0000 mL | INTRAVENOUS | Status: AC | PRN
  Administered 2016-06-23: 10 mL
  Filled 2016-06-23: qty 10

## 2016-06-23 MED ORDER — HEPARIN SOD (PORK) LOCK FLUSH 100 UNIT/ML IV SOLN
500.0000 [IU] | Freq: Every day | INTRAVENOUS | Status: AC | PRN
  Administered 2016-06-23: 500 [IU]
  Filled 2016-06-23: qty 5

## 2016-06-23 MED ORDER — SODIUM CHLORIDE 0.9 % IJ SOLN
10.0000 mL | INTRAMUSCULAR | Status: DC | PRN
Start: 2016-06-23 — End: 2016-06-23
  Administered 2016-06-23: 10 mL via INTRAVENOUS
  Filled 2016-06-23: qty 10

## 2016-06-23 MED ORDER — SODIUM CHLORIDE 0.9 % IV SOLN
250.0000 mL | Freq: Once | INTRAVENOUS | Status: AC
  Administered 2016-06-23: 250 mL via INTRAVENOUS

## 2016-06-23 NOTE — Patient Instructions (Signed)
Platelet Transfusion Introduction A platelet transfusion is a procedure in which you receive donated platelets through an IV tube. Platelets are tiny pieces of blood cells. When a blood vessel is damaged, platelets collect in the damaged area to help form a blood clot. This begins the healing process. If your platelet count gets too low, your blood may have trouble clotting. You may need a platelet transfusion if you have a condition that causes a low number of platelets (thrombocytopenia). A platelet transfusion may be used to stop or prevent bleeding. Tell a health care provider about:  Any allergies you have.  All medicines you are taking, including vitamins, herbs, eye drops, creams, and over-the-counter medicines.  Any problems you or family members have had with anesthetic medicines.  Any blood disorders you have.  Any surgeries you have had.  Any medical conditions you have.  Any reactions you have had during a previous transfusion. What are the risks? Generally, this is a safe procedure. However, problems may occur, including:  Fever with or without chills. The fever usually occurs within the first 4 hours of the transfusion and returns to normal within 48 hours.  Allergic reaction. The reaction is most commonly caused by antibodies your body creates against substances in the transfusion. Signs of an allergic reaction may include itching, hives, difficulty breathing, shock, or low blood pressure.  Sudden (acute) or delayed hemolytic reaction. This rare reaction can occur during the transfusion and up to 28 days after the transfusion. The reaction usually occurs when your body's defense system (immune system) attacks the new platelets. Signs of a hemolytic reaction may include fever, headache, difficulty breathing, low blood pressure, a rapid heartbeat, or pain in your back, abdomen, chest, or IV site.  Transfusion-related acute lung injury (TRALI). TRALI can occur within hours of  a transfusion, or several days later. This is a rare reaction that causes lung damage. The cause is not known.  Infection. Signs of this rare complication may include fever, chills, vomiting, a rapid heartbeat, or low blood pressure. What happens before the procedure?  You may have a blood test to determine your blood type. This is necessary to find out what kind ofplatelets best matches your platelets.  If you have had an allergic reaction to a transfusion in the past, you may be given medicine to help prevent a reaction. Take this medicine only as directed by your health care provider.  Your temperature, blood pressure, and pulse will be monitored before the transfusion. What happens during the procedure?  An IV will be started in your hand or arm.  The transfusion will be attached to your IV tubing. The bag of donated platelets will be attached to your IV tube andgiven into your vein.  Your temperature, blood pressure, and pulse will be monitored regularly during the transfusion. This monitoring is done to help detect early signs of a transfusion reaction.  If you have any signs or symptoms of a reaction, your transfusion will be stopped and you may be given medicine.  When your transfusion is complete, your IV will be removed.  Pressure may be applied to the IV site for a few minutes.  A bandage (dressing) will be applied. The procedure may vary among health care providers and hospitals. What happens after the procedure?  Your blood pressure, temperature, and pulse will be monitored regularly. This information is not intended to replace advice given to you by your health care provider. Make sure you discuss any questions you have  with your health care provider. Document Released: 04/20/2007 Document Revised: 11/29/2015 Document Reviewed: 05/03/2014  2017 [employer]vier

## 2016-06-24 LAB — PREPARE PLATELET PHERESIS
Blood Product Expiration Date: 201712182359
ISSUE DATE / TIME: 201712181316
Unit Type and Rh: 7300

## 2016-06-26 ENCOUNTER — Ambulatory Visit (HOSPITAL_BASED_OUTPATIENT_CLINIC_OR_DEPARTMENT_OTHER): Payer: Medicare Other

## 2016-06-26 ENCOUNTER — Other Ambulatory Visit (HOSPITAL_BASED_OUTPATIENT_CLINIC_OR_DEPARTMENT_OTHER): Payer: Medicare Other

## 2016-06-26 DIAGNOSIS — C9202 Acute myeloblastic leukemia, in relapse: Secondary | ICD-10-CM | POA: Diagnosis not present

## 2016-06-26 DIAGNOSIS — Z452 Encounter for adjustment and management of vascular access device: Secondary | ICD-10-CM | POA: Diagnosis not present

## 2016-06-26 DIAGNOSIS — Z95828 Presence of other vascular implants and grafts: Secondary | ICD-10-CM

## 2016-06-26 DIAGNOSIS — C9200 Acute myeloblastic leukemia, not having achieved remission: Secondary | ICD-10-CM

## 2016-06-26 LAB — CBC WITH DIFFERENTIAL/PLATELET
BASO%: 0 % (ref 0.0–2.0)
BASOS ABS: 0 10*3/uL (ref 0.0–0.1)
EOS ABS: 0 10*3/uL (ref 0.0–0.5)
EOS%: 0.3 % (ref 0.0–7.0)
HEMATOCRIT: 28.2 % — AB (ref 34.8–46.6)
HEMOGLOBIN: 9.7 g/dL — AB (ref 11.6–15.9)
LYMPH#: 1.5 10*3/uL (ref 0.9–3.3)
LYMPH%: 37.4 % (ref 14.0–49.7)
MCH: 29.6 pg (ref 25.1–34.0)
MCHC: 34.4 g/dL (ref 31.5–36.0)
MCV: 86 fL (ref 79.5–101.0)
MONO#: 0.5 10*3/uL (ref 0.1–0.9)
MONO%: 12.6 % (ref 0.0–14.0)
NEUT#: 2 10*3/uL (ref 1.5–6.5)
NEUT%: 49.7 % (ref 38.4–76.8)
PLATELETS: 38 10*3/uL — AB (ref 145–400)
RBC: 3.28 10*6/uL — ABNORMAL LOW (ref 3.70–5.45)
RDW: 14.9 % — AB (ref 11.2–14.5)
WBC: 4 10*3/uL (ref 3.9–10.3)

## 2016-06-26 MED ORDER — HEPARIN SOD (PORK) LOCK FLUSH 100 UNIT/ML IV SOLN
500.0000 [IU] | Freq: Once | INTRAVENOUS | Status: DC | PRN
  Filled 2016-06-26: qty 5

## 2016-06-26 MED ORDER — SODIUM CHLORIDE 0.9 % IJ SOLN
10.0000 mL | INTRAMUSCULAR | Status: DC | PRN
  Administered 2016-06-26: 10 mL via INTRAVENOUS
  Filled 2016-06-26: qty 10

## 2016-07-01 ENCOUNTER — Ambulatory Visit (HOSPITAL_BASED_OUTPATIENT_CLINIC_OR_DEPARTMENT_OTHER): Payer: Medicare Other

## 2016-07-01 ENCOUNTER — Ambulatory Visit: Payer: Medicare Other

## 2016-07-01 ENCOUNTER — Other Ambulatory Visit (HOSPITAL_BASED_OUTPATIENT_CLINIC_OR_DEPARTMENT_OTHER): Payer: Medicare Other

## 2016-07-01 DIAGNOSIS — C9200 Acute myeloblastic leukemia, not having achieved remission: Secondary | ICD-10-CM

## 2016-07-01 DIAGNOSIS — C9202 Acute myeloblastic leukemia, in relapse: Secondary | ICD-10-CM

## 2016-07-01 DIAGNOSIS — Z452 Encounter for adjustment and management of vascular access device: Secondary | ICD-10-CM | POA: Diagnosis not present

## 2016-07-01 DIAGNOSIS — Z95828 Presence of other vascular implants and grafts: Secondary | ICD-10-CM

## 2016-07-01 LAB — CBC WITH DIFFERENTIAL/PLATELET
BASO%: 0.2 % (ref 0.0–2.0)
Basophils Absolute: 0 10*3/uL (ref 0.0–0.1)
EOS%: 0.2 % (ref 0.0–7.0)
Eosinophils Absolute: 0 10*3/uL (ref 0.0–0.5)
HEMATOCRIT: 27.2 % — AB (ref 34.8–46.6)
HEMOGLOBIN: 9.2 g/dL — AB (ref 11.6–15.9)
LYMPH#: 1.1 10*3/uL (ref 0.9–3.3)
LYMPH%: 24.9 % (ref 14.0–49.7)
MCH: 29.7 pg (ref 25.1–34.0)
MCHC: 33.8 g/dL (ref 31.5–36.0)
MCV: 87.7 fL (ref 79.5–101.0)
MONO#: 0.9 10*3/uL (ref 0.1–0.9)
MONO%: 18.8 % — ABNORMAL HIGH (ref 0.0–14.0)
NEUT%: 55.9 % (ref 38.4–76.8)
NEUTROS ABS: 2.5 10*3/uL (ref 1.5–6.5)
NRBC: 0 % (ref 0–0)
Platelets: 34 10*3/uL — ABNORMAL LOW (ref 145–400)
RBC: 3.1 10*6/uL — ABNORMAL LOW (ref 3.70–5.45)
RDW: 15.6 % — ABNORMAL HIGH (ref 11.2–14.5)
WBC: 4.5 10*3/uL (ref 3.9–10.3)

## 2016-07-01 MED ORDER — SODIUM CHLORIDE 0.9 % IJ SOLN
10.0000 mL | INTRAMUSCULAR | Status: DC | PRN
  Administered 2016-07-01: 10 mL via INTRAVENOUS
  Filled 2016-07-01: qty 10

## 2016-07-01 MED ORDER — HEPARIN SOD (PORK) LOCK FLUSH 100 UNIT/ML IV SOLN
500.0000 [IU] | Freq: Once | INTRAVENOUS | Status: DC | PRN
  Filled 2016-07-01: qty 5

## 2016-07-02 DIAGNOSIS — Z01818 Encounter for other preprocedural examination: Secondary | ICD-10-CM | POA: Diagnosis not present

## 2016-07-02 DIAGNOSIS — I10 Essential (primary) hypertension: Secondary | ICD-10-CM | POA: Diagnosis not present

## 2016-07-02 DIAGNOSIS — C9201 Acute myeloblastic leukemia, in remission: Secondary | ICD-10-CM | POA: Diagnosis not present

## 2016-07-02 DIAGNOSIS — Z79899 Other long term (current) drug therapy: Secondary | ICD-10-CM | POA: Diagnosis not present

## 2016-07-02 DIAGNOSIS — M1990 Unspecified osteoarthritis, unspecified site: Secondary | ICD-10-CM | POA: Diagnosis not present

## 2016-07-02 DIAGNOSIS — E785 Hyperlipidemia, unspecified: Secondary | ICD-10-CM | POA: Diagnosis not present

## 2016-07-02 DIAGNOSIS — E039 Hypothyroidism, unspecified: Secondary | ICD-10-CM | POA: Diagnosis not present

## 2016-07-02 DIAGNOSIS — D702 Other drug-induced agranulocytosis: Secondary | ICD-10-CM | POA: Diagnosis not present

## 2016-07-03 ENCOUNTER — Ambulatory Visit (HOSPITAL_BASED_OUTPATIENT_CLINIC_OR_DEPARTMENT_OTHER): Payer: Medicare Other

## 2016-07-03 ENCOUNTER — Other Ambulatory Visit (HOSPITAL_BASED_OUTPATIENT_CLINIC_OR_DEPARTMENT_OTHER): Payer: Medicare Other

## 2016-07-03 ENCOUNTER — Telehealth: Payer: Self-pay | Admitting: *Deleted

## 2016-07-03 ENCOUNTER — Other Ambulatory Visit: Payer: Self-pay | Admitting: *Deleted

## 2016-07-03 DIAGNOSIS — C9200 Acute myeloblastic leukemia, not having achieved remission: Secondary | ICD-10-CM

## 2016-07-03 DIAGNOSIS — Z95828 Presence of other vascular implants and grafts: Secondary | ICD-10-CM

## 2016-07-03 DIAGNOSIS — Z452 Encounter for adjustment and management of vascular access device: Secondary | ICD-10-CM | POA: Diagnosis not present

## 2016-07-03 DIAGNOSIS — C9202 Acute myeloblastic leukemia, in relapse: Secondary | ICD-10-CM

## 2016-07-03 LAB — CBC WITH DIFFERENTIAL/PLATELET
BASO%: 0 % (ref 0.0–2.0)
BASOS ABS: 0 10*3/uL (ref 0.0–0.1)
EOS ABS: 0 10*3/uL (ref 0.0–0.5)
EOS%: 0 % (ref 0.0–7.0)
HCT: 25.2 % — ABNORMAL LOW (ref 34.8–46.6)
HGB: 8.5 g/dL — ABNORMAL LOW (ref 11.6–15.9)
LYMPH%: 21.8 % (ref 14.0–49.7)
MCH: 29.9 pg (ref 25.1–34.0)
MCHC: 33.7 g/dL (ref 31.5–36.0)
MCV: 88.7 fL (ref 79.5–101.0)
MONO#: 0.7 10*3/uL (ref 0.1–0.9)
MONO%: 14 % (ref 0.0–14.0)
NEUT#: 3 10*3/uL (ref 1.5–6.5)
NEUT%: 64.2 % (ref 38.4–76.8)
Platelets: 39 10*3/uL — ABNORMAL LOW (ref 145–400)
RBC: 2.84 10*6/uL — AB (ref 3.70–5.45)
RDW: 15.9 % — ABNORMAL HIGH (ref 11.2–14.5)
WBC: 4.6 10*3/uL (ref 3.9–10.3)
lymph#: 1 10*3/uL (ref 0.9–3.3)

## 2016-07-03 MED ORDER — SODIUM CHLORIDE 0.9 % IJ SOLN
10.0000 mL | INTRAMUSCULAR | Status: DC | PRN
  Administered 2016-07-03: 10 mL via INTRAVENOUS
  Filled 2016-07-03: qty 10

## 2016-07-03 MED ORDER — HEPARIN SOD (PORK) LOCK FLUSH 100 UNIT/ML IV SOLN
500.0000 [IU] | Freq: Once | INTRAVENOUS | Status: AC | PRN
  Administered 2016-07-03: 500 [IU] via INTRAVENOUS
  Filled 2016-07-03: qty 5

## 2016-07-03 NOTE — Telephone Encounter (Signed)
Pt here for labs only.  Hgb 8.5 .  Spoke with pt and husband in the lobby, and gave pt appt date and time for blood transfusion at [facility] at 0900 on Friday  07/04/16.  Pt voiced understanding.

## 2016-07-04 ENCOUNTER — Ambulatory Visit (HOSPITAL_COMMUNITY)
Admission: RE | Admit: 2016-07-04 | Discharge: 2016-07-04 | Disposition: A | Discharge status: Home / Self Care | Payer: Medicare Other | Source: Ambulatory Visit | Attending: Hematology | Admitting: Hematology

## 2016-07-04 DIAGNOSIS — Z005 Encounter for examination of potential donor of organ and tissue: Secondary | ICD-10-CM | POA: Diagnosis not present

## 2016-07-04 DIAGNOSIS — C9200 Acute myeloblastic leukemia, not having achieved remission: Secondary | ICD-10-CM | POA: Diagnosis not present

## 2016-07-04 LAB — PREPARE RBC (CROSSMATCH)

## 2016-07-04 MED ORDER — ACETAMINOPHEN 325 MG PO TABS
650.0000 mg | ORAL_TABLET | Freq: Once | ORAL | Status: AC
  Administered 2016-07-04: 650 mg via ORAL
  Filled 2016-07-04: qty 2

## 2016-07-04 MED ORDER — HEPARIN SOD (PORK) LOCK FLUSH 100 UNIT/ML IV SOLN
500.0000 [IU] | Freq: Every day | INTRAVENOUS | Status: AC | PRN
  Administered 2016-07-04: 500 [IU]
  Filled 2016-07-04: qty 5

## 2016-07-04 MED ORDER — SODIUM CHLORIDE 0.9% FLUSH
10.0000 mL | INTRAVENOUS | Status: AC | PRN
  Administered 2016-07-04: 10 mL

## 2016-07-04 MED ORDER — SODIUM CHLORIDE 0.9 % IV SOLN
250.0000 mL | Freq: Once | INTRAVENOUS | Status: AC
  Administered 2016-07-04: 250 mL via INTRAVENOUS

## 2016-07-04 NOTE — Progress Notes (Signed)
Patient ID: Miranda Mcguire, female   DOB: 03-10-1946, 70 y.o.   MRN: [id number] Pt tolerated transfusion well. Discharged ambulatory in stable condition accompanied by husband.  Coolidge Breeze, RN 07/04/2016

## 2016-07-05 LAB — TYPE AND SCREEN
BLOOD PRODUCT EXPIRATION DATE: 201801062359
Blood Product Expiration Date: 201801032359
ISSUE DATE / TIME: 201712290959
ISSUE DATE / TIME: 201712290959
Unit Type and Rh: 6200
Unit Type and Rh: 6200

## 2016-07-08 ENCOUNTER — Ambulatory Visit (HOSPITAL_BASED_OUTPATIENT_CLINIC_OR_DEPARTMENT_OTHER): Payer: Medicare PPO

## 2016-07-08 ENCOUNTER — Other Ambulatory Visit (HOSPITAL_BASED_OUTPATIENT_CLINIC_OR_DEPARTMENT_OTHER): Payer: Medicare PPO

## 2016-07-08 DIAGNOSIS — C9200 Acute myeloblastic leukemia, not having achieved remission: Secondary | ICD-10-CM

## 2016-07-08 DIAGNOSIS — Z95828 Presence of other vascular implants and grafts: Secondary | ICD-10-CM

## 2016-07-08 DIAGNOSIS — C9202 Acute myeloblastic leukemia, in relapse: Secondary | ICD-10-CM

## 2016-07-08 DIAGNOSIS — Z452 Encounter for adjustment and management of vascular access device: Secondary | ICD-10-CM

## 2016-07-08 LAB — COMPREHENSIVE METABOLIC PANEL
ALT: 59 U/L — AB (ref 0–55)
AST: 25 U/L (ref 5–34)
Albumin: 3.3 g/dL — ABNORMAL LOW (ref 3.5–5.0)
Alkaline Phosphatase: 98 U/L (ref 40–150)
Anion Gap: 9 mEq/L (ref 3–11)
BUN: 19.5 mg/dL (ref 7.0–26.0)
CALCIUM: 9.2 mg/dL (ref 8.4–10.4)
CHLORIDE: 108 meq/L (ref 98–109)
CO2: 24 meq/L (ref 22–29)
CREATININE: 1.1 mg/dL (ref 0.6–1.1)
EGFR: 53 mL/min/{1.73_m2} — ABNORMAL LOW (ref >90)
Glucose: 103 mg/dl (ref 70–140)
Potassium: 3.9 mEq/L (ref 3.5–5.1)
Sodium: 140 mEq/L (ref 136–145)
Total Bilirubin: 0.78 mg/dL (ref 0.20–1.20)
Total Protein: 6.5 g/dL (ref 6.4–8.3)

## 2016-07-08 LAB — CBC WITH DIFFERENTIAL/PLATELET
BASO%: 0.6 % (ref 0.0–2.0)
Basophils Absolute: 0 10*3/uL (ref 0.0–0.1)
EOS%: 0.2 % (ref 0.0–7.0)
Eosinophils Absolute: 0 10*3/uL (ref 0.0–0.5)
HEMATOCRIT: 35.3 % (ref 34.8–46.6)
HGB: 11.8 g/dL (ref 11.6–15.9)
LYMPH#: 0.8 10*3/uL — AB (ref 0.9–3.3)
LYMPH%: 15.9 % (ref 14.0–49.7)
MCH: 29.6 pg (ref 25.1–34.0)
MCHC: 33.5 g/dL (ref 31.5–36.0)
MCV: 88.3 fL (ref 79.5–101.0)
MONO#: 0.7 10*3/uL (ref 0.1–0.9)
MONO%: 13.5 % (ref 0.0–14.0)
NEUT%: 69.8 % (ref 38.4–76.8)
NEUTROS ABS: 3.5 10*3/uL (ref 1.5–6.5)
Platelets: 50 10*3/uL — ABNORMAL LOW (ref 145–400)
RBC: 4 10*6/uL (ref 3.70–5.45)
RDW: 15.6 % — ABNORMAL HIGH (ref 11.2–14.5)
WBC: 5 10*3/uL (ref 3.9–10.3)

## 2016-07-08 LAB — TYPE AND SCREEN
ABO/RH(D): A POS
Antibody Screen: NEGATIVE
UNIT DIVISION: 0
UNIT DIVISION: 0

## 2016-07-08 MED ORDER — HEPARIN SOD (PORK) LOCK FLUSH 100 UNIT/ML IV SOLN
500.0000 [IU] | Freq: Once | INTRAVENOUS | Status: AC | PRN
  Administered 2016-07-08: 500 [IU] via INTRAVENOUS
  Filled 2016-07-08: qty 5

## 2016-07-08 MED ORDER — SODIUM CHLORIDE 0.9 % IJ SOLN
10.0000 mL | INTRAMUSCULAR | Status: DC | PRN
  Administered 2016-07-08: 10 mL via INTRAVENOUS
  Filled 2016-07-08: qty 10

## 2016-07-08 NOTE — Addendum Note (Signed)
Addended by: Jethro Bolus A on: 07/08/2016 12:17 PM   Modules accepted: Orders

## 2016-07-09 ENCOUNTER — Telehealth: Payer: Self-pay | Admitting: *Deleted

## 2016-07-09 ENCOUNTER — Other Ambulatory Visit: Payer: Self-pay | Admitting: Hematology

## 2016-07-09 NOTE — Telephone Encounter (Signed)
Received vm call from [facility] stating that Dr Phill Myron would like pt to receive Vit B-12 injection on fri & will send orders.  Fax # given.  Message to scheduler for inj appt.  Note to Dr Burr Medico.

## 2016-07-09 NOTE — Telephone Encounter (Signed)
I have placed the B12 orders.   Truitt Merle MD

## 2016-07-10 ENCOUNTER — Other Ambulatory Visit: Payer: Self-pay | Admitting: *Deleted

## 2016-07-11 ENCOUNTER — Other Ambulatory Visit (HOSPITAL_BASED_OUTPATIENT_CLINIC_OR_DEPARTMENT_OTHER): Payer: Medicare PPO

## 2016-07-11 ENCOUNTER — Ambulatory Visit (HOSPITAL_BASED_OUTPATIENT_CLINIC_OR_DEPARTMENT_OTHER): Payer: Medicare Other

## 2016-07-11 ENCOUNTER — Ambulatory Visit (HOSPITAL_BASED_OUTPATIENT_CLINIC_OR_DEPARTMENT_OTHER): Payer: Medicare PPO

## 2016-07-11 VITALS — BP 150/60 | HR 95 | Temp 97.0°F | Resp 18

## 2016-07-11 DIAGNOSIS — C9202 Acute myeloblastic leukemia, in relapse: Secondary | ICD-10-CM

## 2016-07-11 DIAGNOSIS — Z452 Encounter for adjustment and management of vascular access device: Secondary | ICD-10-CM

## 2016-07-11 DIAGNOSIS — Z95828 Presence of other vascular implants and grafts: Secondary | ICD-10-CM

## 2016-07-11 DIAGNOSIS — C9200 Acute myeloblastic leukemia, not having achieved remission: Secondary | ICD-10-CM

## 2016-07-11 LAB — CBC WITH DIFFERENTIAL/PLATELET
BASO%: 0.2 % (ref 0.0–2.0)
BASOS ABS: 0 10*3/uL (ref 0.0–0.1)
EOS ABS: 0 10*3/uL (ref 0.0–0.5)
EOS%: 0.2 % (ref 0.0–7.0)
HEMATOCRIT: 34.5 % — AB (ref 34.8–46.6)
HEMOGLOBIN: 11.7 g/dL (ref 11.6–15.9)
LYMPH%: 17.4 % (ref 14.0–49.7)
MCH: 29.8 pg (ref 25.1–34.0)
MCHC: 33.9 g/dL (ref 31.5–36.0)
MCV: 88 fL (ref 79.5–101.0)
MONO#: 0.7 10*3/uL (ref 0.1–0.9)
MONO%: 12.2 % (ref 0.0–14.0)
NEUT#: 3.8 10*3/uL (ref 1.5–6.5)
NEUT%: 70 % (ref 38.4–76.8)
PLATELETS: 48 10*3/uL — AB (ref 145–400)
RBC: 3.92 10*6/uL (ref 3.70–5.45)
RDW: 16.3 % — AB (ref 11.2–14.5)
WBC: 5.4 10*3/uL (ref 3.9–10.3)
lymph#: 0.9 10*3/uL (ref 0.9–3.3)

## 2016-07-11 MED ORDER — CYANOCOBALAMIN 1000 MCG/ML IJ SOLN
1000.0000 ug | Freq: Once | INTRAMUSCULAR | Status: AC
  Administered 2016-07-11: 1000 ug via INTRAMUSCULAR

## 2016-07-11 MED ORDER — SODIUM CHLORIDE 0.9 % IJ SOLN
10.0000 mL | INTRAMUSCULAR | Status: DC | PRN
  Administered 2016-07-11: 10 mL via INTRAVENOUS
  Filled 2016-07-11: qty 10

## 2016-07-11 MED ORDER — HEPARIN SOD (PORK) LOCK FLUSH 100 UNIT/ML IV SOLN
500.0000 [IU] | Freq: Once | INTRAVENOUS | Status: DC | PRN
  Filled 2016-07-11: qty 5

## 2016-07-11 NOTE — Patient Instructions (Signed)
Cyanocobalamin, Vitamin B12 injection What is this medicine? CYANOCOBALAMIN (sye an oh koe BAL a min) is a man made form of vitamin B12. Vitamin B12 is used in the growth of healthy blood cells, nerve cells, and proteins in the body. It also helps with the metabolism of fats and carbohydrates. This medicine is used to treat people who can not absorb vitamin B12. This medicine may be used for other purposes; ask your health care provider or pharmacist if you have questions. COMMON BRAND NAME(S): B-12 Compliance Kit, B-12 Injection Kit, Cyomin, LA-12, Nutri-Twelve, Physicians EZ Use B-12, Primabalt What should I tell my health care provider before I take this medicine? They need to know if you have any of these conditions: -kidney disease -Leber's disease -megaloblastic anemia -an unusual or allergic reaction to cyanocobalamin, cobalt, other medicines, foods, dyes, or preservatives -pregnant or trying to get pregnant -breast-feeding How should I use this medicine? This medicine is injected into a muscle or deeply under the skin. It is usually given by a health care professional in a clinic or doctor's office. However, your doctor may teach you how to inject yourself. Follow all instructions. Talk to your pediatrician regarding the use of this medicine in children. Special care may be needed. Overdosage: If you think you have taken too much of this medicine contact a poison control center or emergency room at once. NOTE: This medicine is only for you. Do not share this medicine with others. What if I miss a dose? If you are given your dose at a clinic or doctor's office, call to reschedule your appointment. If you give your own injections and you miss a dose, take it as soon as you can. If it is almost time for your next dose, take only that dose. Do not take double or extra doses. What may interact with this medicine? -colchicine -heavy alcohol intake This list may not describe all possible  interactions. Give your health care provider a list of all the medicines, herbs, non-prescription drugs, or dietary supplements you use. Also tell them if you smoke, drink alcohol, or use illegal drugs. Some items may interact with your medicine. What should I watch for while using this medicine? Visit your doctor or health care professional regularly. You may need blood work done while you are taking this medicine. You may need to follow a special diet. Talk to your doctor. Limit your alcohol intake and avoid smoking to get the best benefit. What side effects may I notice from receiving this medicine? Side effects that you should report to your doctor or health care professional as soon as possible: -allergic reactions like skin rash, itching or hives, swelling of the face, lips, or tongue -blue tint to skin -chest tightness, pain -difficulty breathing, wheezing -dizziness -red, swollen painful area on the leg Side effects that usually do not require medical attention (report to your doctor or health care professional if they continue or are bothersome): -diarrhea -headache This list may not describe all possible side effects. Call your doctor for medical advice about side effects. You may report side effects to FDA at 1-800-FDA-1088. Where should I keep my medicine? Keep out of the reach of children. Store at room temperature between 15 and 30 degrees C (59 and 85 degrees F). Protect from light. Throw away any unused medicine after the expiration date. NOTE: This sheet is a summary. It may not cover all possible information. If you have questions about this medicine, talk to your doctor, pharmacist, or   health care provider.  2017 Elsevier/Gold Standard (2007-10-04 22:10:20)  

## 2016-07-14 ENCOUNTER — Other Ambulatory Visit (HOSPITAL_BASED_OUTPATIENT_CLINIC_OR_DEPARTMENT_OTHER): Payer: Medicare PPO

## 2016-07-14 ENCOUNTER — Ambulatory Visit (HOSPITAL_BASED_OUTPATIENT_CLINIC_OR_DEPARTMENT_OTHER): Payer: Medicare PPO

## 2016-07-14 DIAGNOSIS — Z95828 Presence of other vascular implants and grafts: Secondary | ICD-10-CM

## 2016-07-14 DIAGNOSIS — C9202 Acute myeloblastic leukemia, in relapse: Secondary | ICD-10-CM | POA: Diagnosis not present

## 2016-07-14 DIAGNOSIS — Z452 Encounter for adjustment and management of vascular access device: Secondary | ICD-10-CM | POA: Diagnosis not present

## 2016-07-14 DIAGNOSIS — C9200 Acute myeloblastic leukemia, not having achieved remission: Secondary | ICD-10-CM

## 2016-07-14 LAB — COMPREHENSIVE METABOLIC PANEL
ALT: 77 U/L — ABNORMAL HIGH (ref 0–55)
AST: 33 U/L (ref 5–34)
Albumin: 3.4 g/dL — ABNORMAL LOW (ref 3.5–5.0)
Alkaline Phosphatase: 98 U/L (ref 40–150)
Anion Gap: 9 mEq/L (ref 3–11)
BUN: 15.1 mg/dL (ref 7.0–26.0)
CALCIUM: 9.2 mg/dL (ref 8.4–10.4)
CHLORIDE: 107 meq/L (ref 98–109)
CO2: 25 mEq/L (ref 22–29)
Creatinine: 1.1 mg/dL (ref 0.6–1.1)
EGFR: 49 mL/min/{1.73_m2} — AB (ref >90)
Glucose: 130 mg/dl (ref 70–140)
POTASSIUM: 3.8 meq/L (ref 3.5–5.1)
SODIUM: 141 meq/L (ref 136–145)
Total Bilirubin: 0.72 mg/dL (ref 0.20–1.20)
Total Protein: 6.5 g/dL (ref 6.4–8.3)

## 2016-07-14 LAB — CBC WITH DIFFERENTIAL/PLATELET
BASO%: 0.4 % (ref 0.0–2.0)
BASOS ABS: 0 10*3/uL (ref 0.0–0.1)
EOS ABS: 0 10*3/uL (ref 0.0–0.5)
EOS%: 0.2 % (ref 0.0–7.0)
HEMATOCRIT: 34.2 % — AB (ref 34.8–46.6)
HEMOGLOBIN: 11.5 g/dL — AB (ref 11.6–15.9)
LYMPH%: 13.7 % — ABNORMAL LOW (ref 14.0–49.7)
MCH: 29.9 pg (ref 25.1–34.0)
MCHC: 33.5 g/dL (ref 31.5–36.0)
MCV: 89.1 fL (ref 79.5–101.0)
MONO#: 0.7 10*3/uL (ref 0.1–0.9)
MONO%: 11.1 % (ref 0.0–14.0)
NEUT#: 4.6 10*3/uL (ref 1.5–6.5)
NEUT%: 74.6 % (ref 38.4–76.8)
Platelets: 62 10*3/uL — ABNORMAL LOW (ref 145–400)
RBC: 3.84 10*6/uL (ref 3.70–5.45)
RDW: 17 % — AB (ref 11.2–14.5)
WBC: 6.1 10*3/uL (ref 3.9–10.3)
lymph#: 0.8 10*3/uL — ABNORMAL LOW (ref 0.9–3.3)

## 2016-07-14 MED ORDER — SODIUM CHLORIDE 0.9 % IJ SOLN
10.0000 mL | INTRAMUSCULAR | Status: DC | PRN
  Administered 2016-07-14: 10 mL via INTRAVENOUS
  Filled 2016-07-14: qty 10

## 2016-07-22 ENCOUNTER — Ambulatory Visit (HOSPITAL_BASED_OUTPATIENT_CLINIC_OR_DEPARTMENT_OTHER): Payer: Medicare PPO

## 2016-07-22 ENCOUNTER — Other Ambulatory Visit (HOSPITAL_BASED_OUTPATIENT_CLINIC_OR_DEPARTMENT_OTHER): Payer: Medicare PPO

## 2016-07-22 DIAGNOSIS — C9202 Acute myeloblastic leukemia, in relapse: Secondary | ICD-10-CM

## 2016-07-22 DIAGNOSIS — C9200 Acute myeloblastic leukemia, not having achieved remission: Secondary | ICD-10-CM

## 2016-07-22 DIAGNOSIS — Z452 Encounter for adjustment and management of vascular access device: Secondary | ICD-10-CM

## 2016-07-22 DIAGNOSIS — Z95828 Presence of other vascular implants and grafts: Secondary | ICD-10-CM

## 2016-07-22 LAB — CBC WITH DIFFERENTIAL/PLATELET
BASO%: 0.3 % (ref 0.0–2.0)
BASOS ABS: 0 10*3/uL (ref 0.0–0.1)
EOS%: 0.4 % (ref 0.0–7.0)
Eosinophils Absolute: 0 10*3/uL (ref 0.0–0.5)
HEMATOCRIT: 32.2 % — AB (ref 34.8–46.6)
HEMOGLOBIN: 10.9 g/dL — AB (ref 11.6–15.9)
LYMPH#: 0.8 10*3/uL — AB (ref 0.9–3.3)
LYMPH%: 11.7 % — ABNORMAL LOW (ref 14.0–49.7)
MCH: 30.3 pg (ref 25.1–34.0)
MCHC: 33.9 g/dL (ref 31.5–36.0)
MCV: 89.4 fL (ref 79.5–101.0)
MONO#: 0.9 10*3/uL (ref 0.1–0.9)
MONO%: 13.6 % (ref 0.0–14.0)
NEUT%: 74 % (ref 38.4–76.8)
NEUTROS ABS: 5 10*3/uL (ref 1.5–6.5)
Platelets: 64 10*3/uL — ABNORMAL LOW (ref 145–400)
RBC: 3.6 10*6/uL — ABNORMAL LOW (ref 3.70–5.45)
RDW: 17.1 % — ABNORMAL HIGH (ref 11.2–14.5)
WBC: 6.8 10*3/uL (ref 3.9–10.3)

## 2016-07-22 MED ORDER — SODIUM CHLORIDE 0.9 % IJ SOLN
10.0000 mL | INTRAMUSCULAR | Status: DC | PRN
  Administered 2016-07-22: 10 mL via INTRAVENOUS
  Filled 2016-07-22: qty 10

## 2016-07-22 MED ORDER — HEPARIN SOD (PORK) LOCK FLUSH 100 UNIT/ML IV SOLN
500.0000 [IU] | Freq: Once | INTRAVENOUS | Status: AC | PRN
  Administered 2016-07-22: 500 [IU] via INTRAVENOUS
  Filled 2016-07-22: qty 5

## 2016-07-22 NOTE — Progress Notes (Signed)
Patient requested we deaccess her PAC now, she reviewed her labs herself and did not wish for me to call her MD at this time.

## 2016-07-22 NOTE — Addendum Note (Signed)
Addended by: Smiley Houseman F on: 07/22/2016 12:16 PM   Modules accepted: Orders

## 2016-08-06 HISTORY — PX: COLONOSCOPY: SHX174

## 2016-08-07 HISTORY — PX: BONE MARROW TRANSPLANT: SHX200

## 2016-10-09 DIAGNOSIS — Z880 Allergy status to penicillin: Secondary | ICD-10-CM | POA: Diagnosis not present

## 2016-10-09 DIAGNOSIS — Z9221 Personal history of antineoplastic chemotherapy: Secondary | ICD-10-CM | POA: Diagnosis not present

## 2016-10-09 DIAGNOSIS — Z006 Encounter for examination for normal comparison and control in clinical research program: Secondary | ICD-10-CM | POA: Diagnosis not present

## 2016-10-09 DIAGNOSIS — Z8719 Personal history of other diseases of the digestive system: Secondary | ICD-10-CM | POA: Diagnosis not present

## 2016-10-09 DIAGNOSIS — E785 Hyperlipidemia, unspecified: Secondary | ICD-10-CM | POA: Diagnosis not present

## 2016-10-09 DIAGNOSIS — E039 Hypothyroidism, unspecified: Secondary | ICD-10-CM | POA: Diagnosis not present

## 2016-10-09 DIAGNOSIS — Z9481 Bone marrow transplant status: Secondary | ICD-10-CM | POA: Diagnosis not present

## 2016-10-09 DIAGNOSIS — C9201 Acute myeloblastic leukemia, in remission: Secondary | ICD-10-CM | POA: Diagnosis not present

## 2016-10-09 DIAGNOSIS — N179 Acute kidney failure, unspecified: Secondary | ICD-10-CM | POA: Diagnosis not present

## 2016-10-09 DIAGNOSIS — Z79899 Other long term (current) drug therapy: Secondary | ICD-10-CM | POA: Diagnosis not present

## 2016-10-09 DIAGNOSIS — I10 Essential (primary) hypertension: Secondary | ICD-10-CM | POA: Diagnosis not present

## 2016-10-09 DIAGNOSIS — Z886 Allergy status to analgesic agent status: Secondary | ICD-10-CM | POA: Diagnosis not present

## 2016-10-17 ENCOUNTER — Other Ambulatory Visit: Payer: Self-pay | Admitting: Family Medicine

## 2016-10-28 ENCOUNTER — Telehealth: Payer: Self-pay | Admitting: Family Medicine

## 2016-10-28 MED ORDER — AMLODIPINE BESYLATE 10 MG PO TABS: 10.0000 mg | ORAL_TABLET | Freq: Every day | ORAL | 3 refills | Status: DC

## 2016-10-28 NOTE — Telephone Encounter (Signed)
° °  Pt request refill of the following:  amLODipine (NORVASC) 10 MG tablet   Phamacy:  [facility]

## 2016-10-28 NOTE — Telephone Encounter (Signed)
Refill for one year 

## 2016-10-28 NOTE — Telephone Encounter (Signed)
Can we refill this? 

## 2016-10-28 NOTE — Telephone Encounter (Signed)
I sent script e-scribe to [facility] and a 90 day supply with refills.

## 2016-10-31 IMAGING — MR MR LUMBAR SPINE WO/W CM
4 of 7 series · 21 of 48 positions shown · IV contrast (multihance)
Comparison: None.

CLINICAL DATA: One year history of central low back pain. No
radicular pain. History of leukemia. Currently in remission. History
of prior back surgery in 6883.

EXAM:
MRI LUMBAR SPINE WITHOUT AND WITH CONTRAST
TECHNIQUE: Multiplanar and multiecho pulse sequences of the lumbar spine were
obtained without and with intravenous contrast.
CONTRAST:  15mL MULTIHANCE GADOBENATE DIMEGLUMINE 529 MG/ML IV SOLN

[Series 7: T1 · sagittal · 4.0mm · 0.73mm/px · 5 of 14 slices shown (1 of 2)]
[im 1/14]
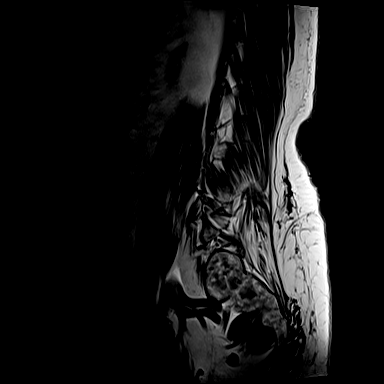
[im 4/14]
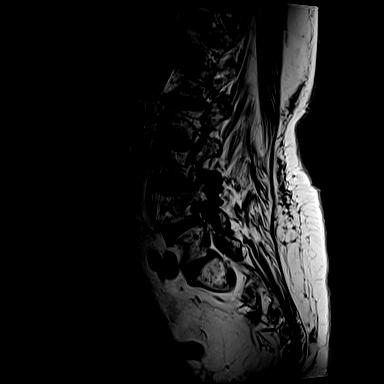
[im 7/14]
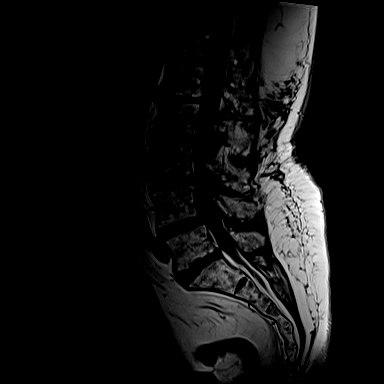
[im 10/14]
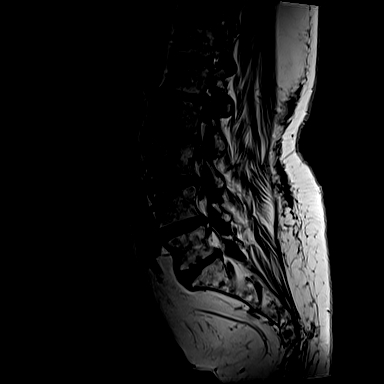
[im 14/14]
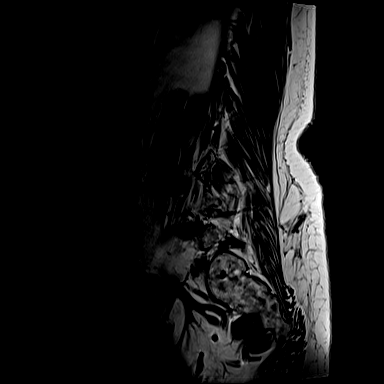

[Series 8: T1 · axial · 4.0mm · 0.56mm/px · z∈[-44,+102]mm · 4 of 32 slices shown (2 of 2)]
[im 1/32]
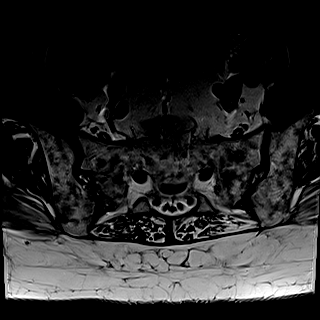
[im 4/32]
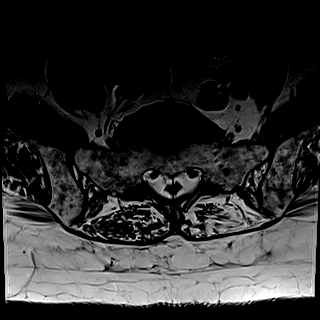
[im 18/32]
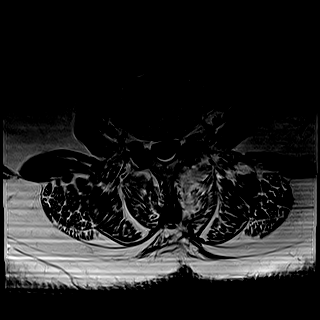
[im 28/32]
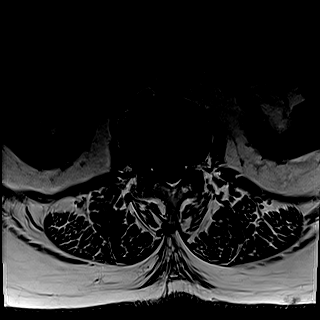

[Series 9: T2 · axial · 4.0mm · 0.28mm/px · z∈[-44,+122]mm · 8 of 32 slices shown (1 of 2)]
[im 1/32]
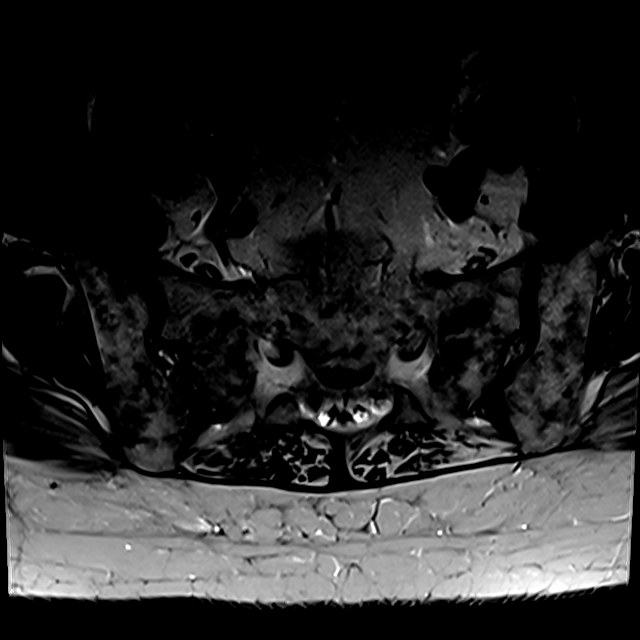
[im 4/32]
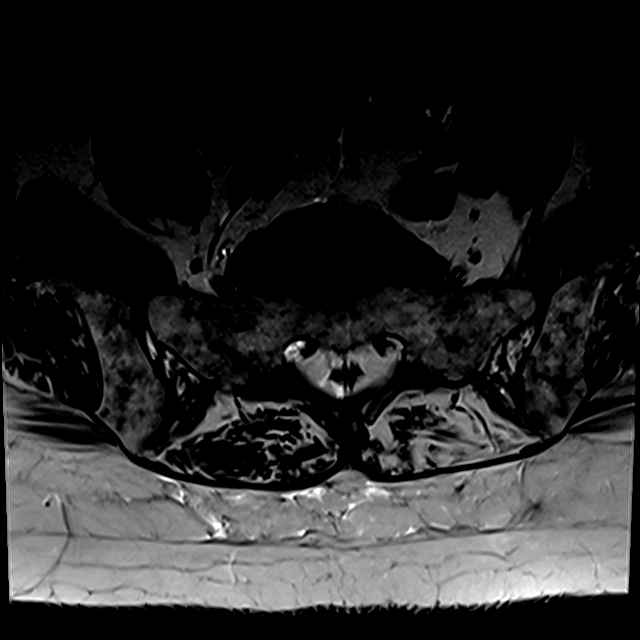
[im 11/32]
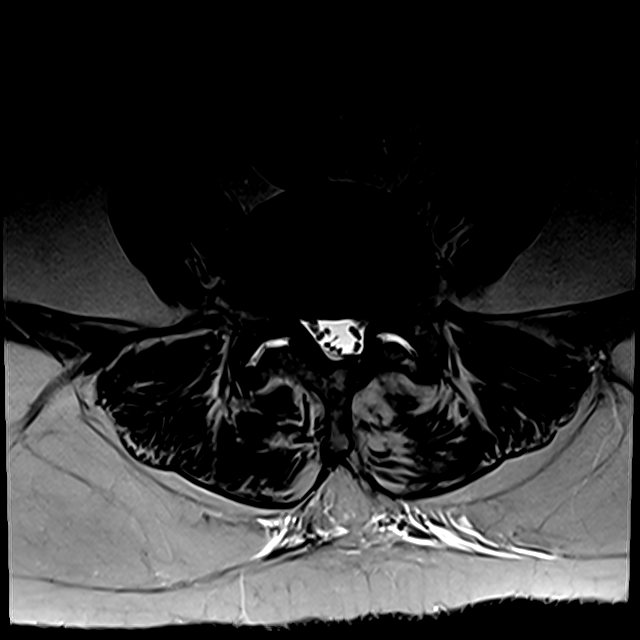
[im 14/32]
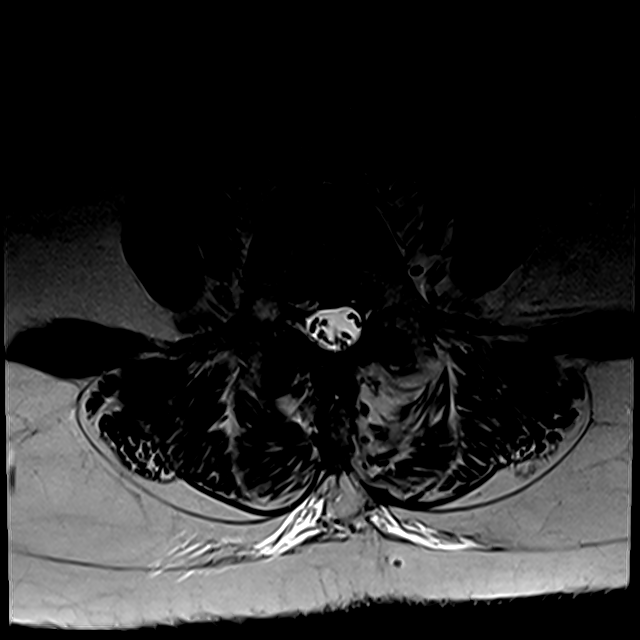
[im 18/32]
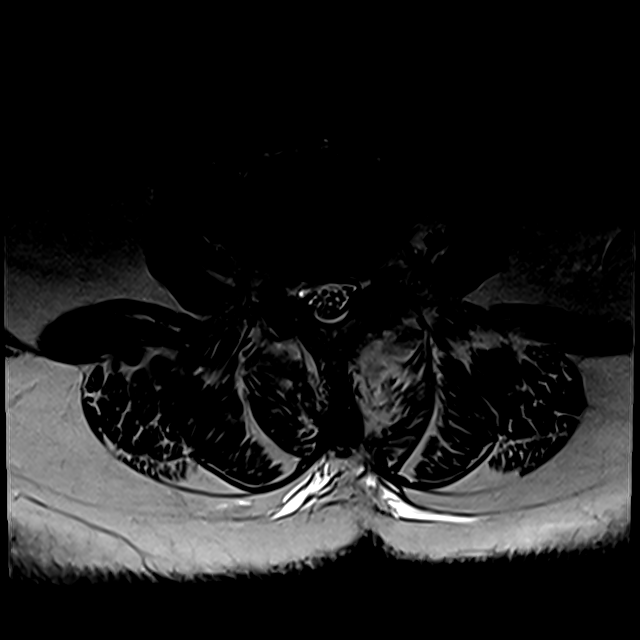
[im 21/32]
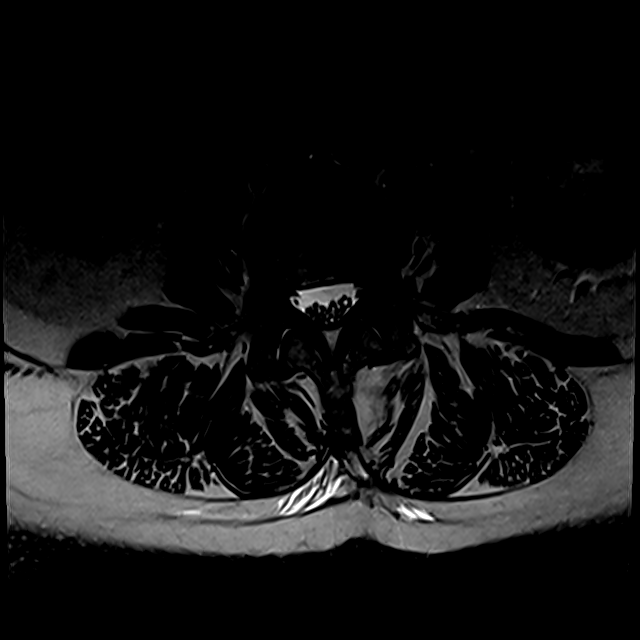
[im 28/32]
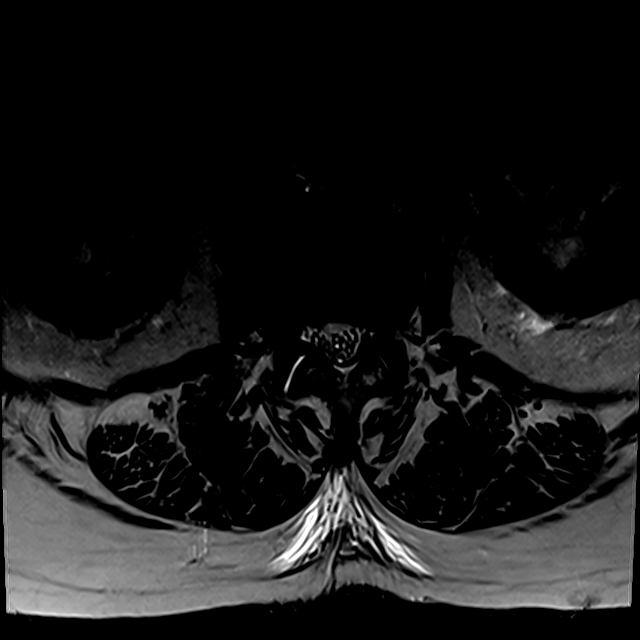
[im 32/32]
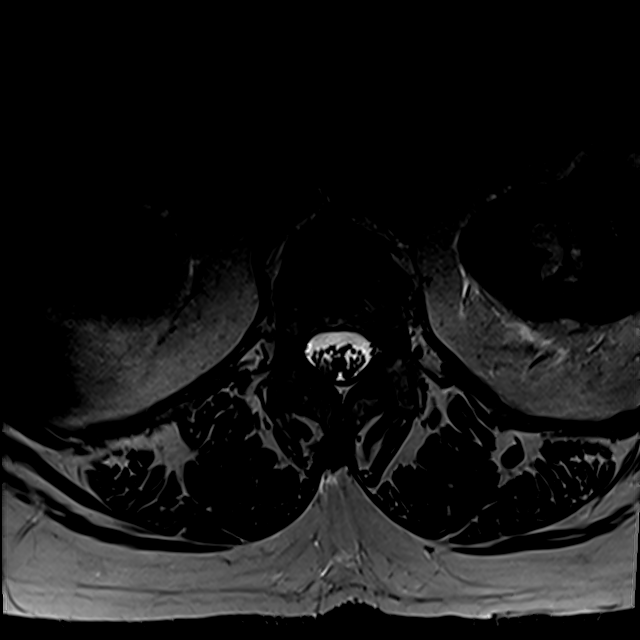

[Series 10: T2 · sagittal · 4.0mm · 0.73mm/px · 4 of 14 slices shown (2 of 2)]
[im 1/14]
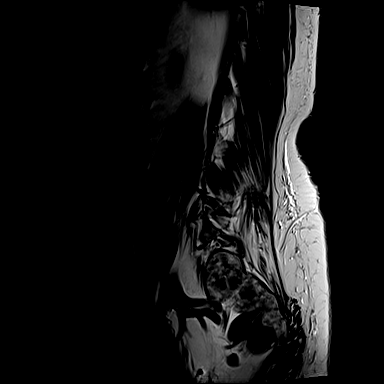
[im 5/14]
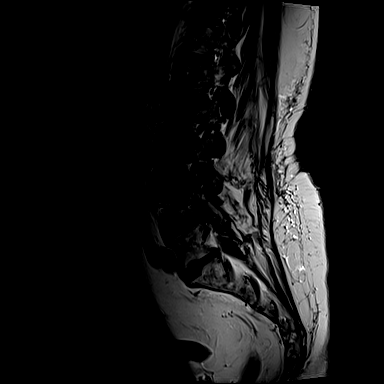
[im 9/14]
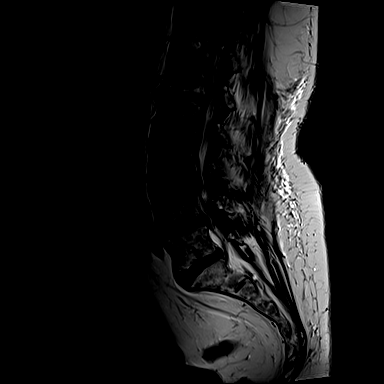
[im 14/14]
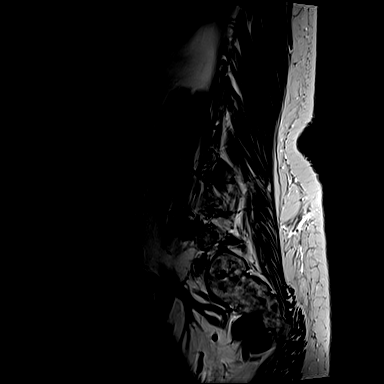

[21 of 48 positions shown; findings below may reference images not displayed]

FINDINGS: Segmentation: 5 lumbar type vertebral bodies. The last full
intervertebral disc space is labeled L5-S1.

Alignment:  Normal.

Vertebrae: Very speckled heterogeneous marrow pattern on the T1 and
T2 weighted images. I do not see any enhancement or abnormal STIR
signal to suggest a pathologic process. This is most likely related
to the patient's leukemia and could be reactivated/hemopoietic
marrow.

Conus medullaris: Extends to the L1 level and appears normal.

Paraspinal and other soft tissues: No significant findings.

Disc levels:

L1-2: Moderate degenerative disc disease with a diffuse bulging
annulus and minimal flattening of the ventral thecal sac. No
significant spinal or lateral recess stenosis. Mild right foraminal
encroachment.

L2-3:  No significant findings.

L3-4: Bulging annulus and moderate facet disease contributing to
early spinal and mild bilateral lateral recess stenosis. There is a
shallow broad-based right foraminal and extra foraminal disc
protrusion contacting and slightly displacing the right L3 nerve
root.

L4-5: Diffuse bulging degenerated annulus and advanced facet
disease. There is flattening of the ventral thecal sac and bilateral
lateral recess stenosis. Mild bilateral foraminal encroachment. A
left-sided laminectomy defect is noted.

L5-S1: Diffuse bulging degenerated annulus and moderate facet
disease but no focal disc protrusions, spinal or foraminal stenosis.
IMPRESSION: 1. Abnormal marrow pattern likely the sequela of treated leukemia.
2. Multifactorial early spinal and mild bilateral lateral recess
stenosis at L3-4. There is also a foraminal and extra foraminal disc
protrusion on the right which is displacing the right L3 nerve root.
3. Multifactorial mild bilateral lateral recess stenosis and
foraminal stenosis at L4-5.
4. Mild right foraminal encroachment at L1-2.

## 2016-11-17 LAB — HEPATIC FUNCTION PANEL
ALT: 29 U/L (ref 7–35)
AST: 26 U/L (ref 13–35)
Alkaline Phosphatase: 73 U/L (ref 25–125)
Bilirubin, Total: 0.4 mg/dL

## 2016-11-17 LAB — CBC AND DIFFERENTIAL
HCT: 32 % — AB (ref 36–46)
HEMOGLOBIN: 10.9 g/dL — AB (ref 12.0–16.0)
Platelets: 126 10*3/uL — AB (ref 150–399)
WBC: 6.7 10*3/mL

## 2016-11-17 LAB — BASIC METABOLIC PANEL
BUN: 29 mg/dL — AB (ref 4–21)
CREATININE: 1.7 mg/dL — AB (ref 0.5–1.1)
GLUCOSE: 170 mg/dL
POTASSIUM: 4.7 mmol/L (ref 3.4–5.3)
SODIUM: 138 mmol/L (ref 137–147)

## 2016-11-18 ENCOUNTER — Encounter: Payer: Self-pay | Admitting: Family Medicine

## 2016-12-16 ENCOUNTER — Telehealth: Payer: Self-pay | Admitting: *Deleted

## 2017-01-09 ENCOUNTER — Other Ambulatory Visit: Payer: Self-pay | Admitting: Counselor

## 2017-01-09 ENCOUNTER — Ambulatory Visit: Payer: Medicare PPO

## 2017-01-09 DIAGNOSIS — R1084 Generalized abdominal pain: Secondary | ICD-10-CM

## 2017-01-14 NOTE — Telephone Encounter (Signed)
Error

## 2017-01-19 ENCOUNTER — Other Ambulatory Visit: Payer: Self-pay | Admitting: Family Medicine

## 2017-01-19 DIAGNOSIS — Z1231 Encounter for screening mammogram for malignant neoplasm of breast: Secondary | ICD-10-CM

## 2017-01-27 ENCOUNTER — Ambulatory Visit
Admission: RE | Admit: 2017-01-27 | Discharge: 2017-01-27 | Disposition: A | Discharge status: Home / Self Care | Payer: Medicare PPO | Source: Ambulatory Visit | Attending: Family Medicine | Admitting: Family Medicine

## 2017-01-27 DIAGNOSIS — Z1231 Encounter for screening mammogram for malignant neoplasm of breast: Secondary | ICD-10-CM

## 2017-01-31 ENCOUNTER — Other Ambulatory Visit: Payer: Self-pay | Admitting: Family Medicine

## 2017-02-12 ENCOUNTER — Encounter: Payer: Self-pay | Admitting: Family Medicine

## 2017-02-12 ENCOUNTER — Ambulatory Visit (INDEPENDENT_AMBULATORY_CARE_PROVIDER_SITE_OTHER): Payer: Medicare PPO | Admitting: Family Medicine

## 2017-02-12 VITALS — BP 130/86 | HR 91 | Temp 98.4°F | Ht 60.0 in | Wt 157.0 lb

## 2017-02-12 DIAGNOSIS — M150 Primary generalized (osteo)arthritis: Secondary | ICD-10-CM

## 2017-02-12 DIAGNOSIS — C9201 Acute myeloblastic leukemia, in remission: Secondary | ICD-10-CM

## 2017-02-12 DIAGNOSIS — I10 Essential (primary) hypertension: Secondary | ICD-10-CM | POA: Diagnosis not present

## 2017-02-12 DIAGNOSIS — M810 Age-related osteoporosis without current pathological fracture: Secondary | ICD-10-CM | POA: Diagnosis not present

## 2017-02-12 DIAGNOSIS — E039 Hypothyroidism, unspecified: Secondary | ICD-10-CM | POA: Diagnosis not present

## 2017-02-12 DIAGNOSIS — D696 Thrombocytopenia, unspecified: Secondary | ICD-10-CM | POA: Diagnosis not present

## 2017-02-12 DIAGNOSIS — R739 Hyperglycemia, unspecified: Secondary | ICD-10-CM

## 2017-02-12 DIAGNOSIS — M159 Polyosteoarthritis, unspecified: Secondary | ICD-10-CM

## 2017-02-12 LAB — LIPID PANEL
Cholesterol: 307 mg/dL — ABNORMAL HIGH (ref 0–200)
HDL: 50.3 mg/dL (ref >39.00)
NonHDL: 256.32
TRIGLYCERIDES: 350 mg/dL — AB (ref 0.0–149.0)
Total CHOL/HDL Ratio: 6
VLDL: 70 mg/dL — ABNORMAL HIGH (ref 0.0–40.0)

## 2017-02-12 LAB — HEMOGLOBIN A1C: Hgb A1c MFr Bld: 5.9 % (ref 4.6–6.5)

## 2017-02-12 LAB — T3, FREE: T3 FREE: 2.7 pg/mL (ref 2.3–4.2)

## 2017-02-12 LAB — LDL CHOLESTEROL, DIRECT: LDL DIRECT: 158 mg/dL

## 2017-02-12 LAB — TSH: TSH: 2.87 u[IU]/mL (ref 0.35–4.50)

## 2017-02-12 LAB — T4, FREE: FREE T4: 0.9 ng/dL (ref 0.60–1.60)

## 2017-02-12 MED ORDER — ACYCLOVIR 800 MG PO TABS: 800.0000 mg | ORAL_TABLET | Freq: Two times a day (BID) | ORAL | 0 refills | Status: DC

## 2017-02-12 MED ORDER — SULFAMETHOXAZOLE-TRIMETHOPRIM 400-80 MG PO TABS: ORAL_TABLET | ORAL | 0 refills | Status: DC

## 2017-02-12 MED ORDER — MG-PLUS PROTEIN 133 MG PO TABS: ORAL_TABLET | ORAL | 0 refills | Status: DC

## 2017-02-12 MED ORDER — METOPROLOL TARTRATE 25 MG PO TABS: 25.0000 mg | ORAL_TABLET | Freq: Two times a day (BID) | ORAL | 3 refills | Status: DC

## 2017-02-12 NOTE — Progress Notes (Signed)
   Subjective:    Patient ID: Miranda Mcguire, female    DOB: [birth year], 71 y.o.   MRN: [id number]  HPI Here to follow up on several issues. 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 She is on a Tacrolimus taper and hopes to be off this by the end of this month. She feels good in general. Her appetite is good and her weight is stable. Her BP is stable. She had labs drawn last week at [facility] showing a stable WBC and HGB, with platelets being slightly low at 131 but stable. Her electrolytes are normal, liver enzymes are normal, and her renal function is normal.  She has had some elevated random glucoses in the past few months, but none over 200 that I can see. Her last DEXA was in 2012. She recently had a mammogram. She has seen her dentist. She plans on seeing her ophthalmologist in 2 weeks.    Review of Systems  Constitutional: Negative.   HENT: Negative.   Eyes: Negative.   Respiratory: Negative.   Cardiovascular: Negative.   Gastrointestinal: Negative.   Genitourinary: Negative for decreased urine volume, difficulty urinating, dyspareunia, dysuria, enuresis, flank pain, frequency, hematuria, pelvic pain and urgency.  Musculoskeletal: Negative.   Skin: Negative.   Neurological: Negative.   Psychiatric/Behavioral: Negative.        Objective:   Physical Exam  Constitutional: She is oriented to person, place, and time. She appears well-developed and well-nourished. No distress.  HENT:  Head: Normocephalic and atraumatic.  Right Ear: External ear normal.  Left Ear: External ear normal.  Nose: Nose normal.  Mouth/Throat: Oropharynx is clear and moist. No oropharyngeal exudate.  Eyes: Pupils are equal, round, and reactive to light. Conjunctivae and EOM are normal. No scleral icterus.  Neck: Normal range of motion. Neck supple. No JVD present.  No thyromegaly present.  Cardiovascular: Normal rate, regular rhythm, normal heart sounds and intact distal pulses.  Exam reveals no gallop and no friction rub.   No murmur heard. EKG is within normal limits   Pulmonary/Chest: Effort normal and breath sounds normal. No respiratory distress. She has no wheezes. She has no rales. She exhibits no tenderness.  Abdominal: Soft. Bowel sounds are normal. She exhibits no distension and no mass. There is no tenderness. There is no rebound and no guarding.  Musculoskeletal: Normal range of motion. She exhibits no edema or tenderness.  Lymphadenopathy:    She has no cervical adenopathy.  Neurological: She is alert and oriented to person, place, and time. She has normal reflexes. No cranial nerve deficit. She exhibits normal muscle tone. Coordination normal.  Skin: Skin is warm and dry. No rash noted. No erythema.  Psychiatric: She has a normal mood and affect. Her behavior is normal. Judgment and thought content normal.          Assessment & Plan:  She is doing very well after chemotherapy and a BMT at [facility]. Her AML is now in remission. Her HTN is stable. We will get fasting labs today to check a HgbA1c, a thyroid panel, and lipids. Set up a DEXA soon.  Alysia Penna, MD

## 2017-02-12 NOTE — Patient Instructions (Signed)
WE NOW OFFER   [facility] FAST TRACK!!!  SAME DAY Appointments for ACUTE CARE  Such as: Sprains, Injuries, cuts, abrasions, rashes, muscle pain, joint pain, back pain Colds, flu, sore throats, headache, allergies, cough, fever  Ear pain, sinus and eye infections Abdominal pain, nausea, vomiting, diarrhea, upset stomach Animal/insect bites  3 Easy Ways to Schedule: Walk-In Scheduling Call in scheduling Mychart Sign-up: [email or web address]         

## 2017-02-16 ENCOUNTER — Ambulatory Visit (INDEPENDENT_AMBULATORY_CARE_PROVIDER_SITE_OTHER)
Admission: RE | Admit: 2017-02-16 | Discharge: 2017-02-16 | Disposition: A | Discharge status: Home / Self Care | Payer: Medicare PPO | Source: Ambulatory Visit | Attending: Family Medicine | Admitting: Family Medicine

## 2017-02-16 ENCOUNTER — Other Ambulatory Visit: Payer: Self-pay | Admitting: Family Medicine

## 2017-02-16 DIAGNOSIS — M810 Age-related osteoporosis without current pathological fracture: Secondary | ICD-10-CM | POA: Diagnosis not present

## 2017-02-16 MED ORDER — ATORVASTATIN CALCIUM 10 MG PO TABS: 10.0000 mg | ORAL_TABLET | Freq: Every day | ORAL | 3 refills | Status: DC

## 2017-02-18 ENCOUNTER — Telehealth: Payer: Self-pay | Admitting: Family Medicine

## 2017-02-18 NOTE — Telephone Encounter (Signed)
Call her pharmacy to switch to name brand only Synthroid, same dosage

## 2017-02-18 NOTE — Telephone Encounter (Signed)
Pt requesting to change Synthroid medication, due to recent recall.

## 2017-02-20 MED ORDER — SYNTHROID 50 MCG PO TABS: 50.0000 ug | ORAL_TABLET | Freq: Every day | ORAL | 3 refills | Status: DC

## 2017-02-20 NOTE — Telephone Encounter (Signed)
I sent script e-scribe to [facility] for a 90 day supply and spoke with pt.

## 2017-03-26 ENCOUNTER — Encounter: Payer: Self-pay | Admitting: Family Medicine

## 2017-04-01 ENCOUNTER — Encounter (INDEPENDENT_AMBULATORY_CARE_PROVIDER_SITE_OTHER): Payer: Medicare PPO | Admitting: Ophthalmology

## 2017-04-01 DIAGNOSIS — H43813 Vitreous degeneration, bilateral: Secondary | ICD-10-CM

## 2017-04-01 DIAGNOSIS — I10 Essential (primary) hypertension: Secondary | ICD-10-CM | POA: Diagnosis not present

## 2017-04-01 DIAGNOSIS — H35341 Macular cyst, hole, or pseudohole, right eye: Secondary | ICD-10-CM | POA: Diagnosis not present

## 2017-04-01 DIAGNOSIS — H35033 Hypertensive retinopathy, bilateral: Secondary | ICD-10-CM | POA: Diagnosis not present

## 2017-04-01 DIAGNOSIS — H353132 Nonexudative age-related macular degeneration, bilateral, intermediate dry stage: Secondary | ICD-10-CM | POA: Diagnosis not present

## 2017-04-29 ENCOUNTER — Telehealth: Payer: Self-pay

## 2017-04-29 ENCOUNTER — Ambulatory Visit (HOSPITAL_BASED_OUTPATIENT_CLINIC_OR_DEPARTMENT_OTHER): Payer: Medicare PPO

## 2017-04-29 DIAGNOSIS — C9202 Acute myeloblastic leukemia, in relapse: Secondary | ICD-10-CM

## 2017-04-29 LAB — CBC WITH DIFFERENTIAL/PLATELET
BASO%: 1.2 % (ref 0.0–2.0)
Basophils Absolute: 0.1 10e3/uL (ref 0.0–0.1)
EOS%: 6.2 % (ref 0.0–7.0)
Eosinophils Absolute: 0.4 10e3/uL (ref 0.0–0.5)
HCT: 39.6 % (ref 34.8–46.6)
HGB: 13.2 g/dL (ref 11.6–15.9)
LYMPH%: 13.6 % — ABNORMAL LOW (ref 14.0–49.7)
MCH: 32.5 pg (ref 25.1–34.0)
MCHC: 33.4 g/dL (ref 31.5–36.0)
MCV: 97.1 fL (ref 79.5–101.0)
MONO#: 0.6 10e3/uL (ref 0.1–0.9)
MONO%: 7.8 % (ref 0.0–14.0)
NEUT#: 5.1 10e3/uL (ref 1.5–6.5)
NEUT%: 71.2 % (ref 38.4–76.8)
Platelets: 86 10e3/uL — ABNORMAL LOW (ref 145–400)
RBC: 4.08 10e6/uL (ref 3.70–5.45)
RDW: 13.6 % (ref 11.2–14.5)
WBC: 7.1 10e3/uL (ref 3.9–10.3)
lymph#: 1 10e3/uL (ref 0.9–3.3)

## 2017-04-29 LAB — COMPREHENSIVE METABOLIC PANEL WITH GFR
ALT: 337 U/L (ref 0–55)
AST: 87 U/L — ABNORMAL HIGH (ref 5–34)
Albumin: 3.6 g/dL (ref 3.5–5.0)
Alkaline Phosphatase: 200 U/L — ABNORMAL HIGH (ref 40–150)
Anion Gap: 10 meq/L (ref 3–11)
BUN: 23.4 mg/dL (ref 7.0–26.0)
CO2: 22 meq/L (ref 22–29)
Calcium: 9 mg/dL (ref 8.4–10.4)
Chloride: 108 meq/L (ref 98–109)
Creatinine: 1.2 mg/dL — ABNORMAL HIGH (ref 0.6–1.1)
EGFR: 47 ml/min/1.73 m2 — ABNORMAL LOW
Glucose: 131 mg/dL (ref 70–140)
Potassium: 3.8 meq/L (ref 3.5–5.1)
Sodium: 140 meq/L (ref 136–145)
Total Bilirubin: 0.59 mg/dL (ref 0.20–1.20)
Total Protein: 6.7 g/dL (ref 6.4–8.3)

## 2017-04-29 NOTE — Telephone Encounter (Signed)
Called with lab results today and faxed lab results to Dr. Nadara Mustard. Verbalized understanding.

## 2017-04-30 ENCOUNTER — Telehealth: Payer: Self-pay

## 2017-04-30 NOTE — Telephone Encounter (Signed)
[name], nurse at [facility] called. Patient would like to get her labs locally, and would like to come to the [place] to get lab work.Miranda Mcguire

## 2017-04-30 NOTE — Telephone Encounter (Signed)
Called Megan back, per Dr. Burr Medico okay to have labs at [facility]. They will fax orders.

## 2017-05-01 ENCOUNTER — Other Ambulatory Visit: Payer: Self-pay | Admitting: *Deleted

## 2017-05-01 DIAGNOSIS — D649 Anemia, unspecified: Secondary | ICD-10-CM

## 2017-05-05 ENCOUNTER — Telehealth: Payer: Self-pay | Admitting: *Deleted

## 2017-05-05 ENCOUNTER — Other Ambulatory Visit (HOSPITAL_BASED_OUTPATIENT_CLINIC_OR_DEPARTMENT_OTHER): Payer: Medicare PPO

## 2017-05-05 DIAGNOSIS — C9202 Acute myeloblastic leukemia, in relapse: Secondary | ICD-10-CM | POA: Diagnosis not present

## 2017-05-05 DIAGNOSIS — D649 Anemia, unspecified: Secondary | ICD-10-CM

## 2017-05-05 LAB — COMPREHENSIVE METABOLIC PANEL WITH GFR
ALT: 456 U/L (ref 0–55)
AST: 115 U/L — ABNORMAL HIGH (ref 5–34)
Albumin: 3.5 g/dL (ref 3.5–5.0)
Alkaline Phosphatase: 175 U/L — ABNORMAL HIGH (ref 40–150)
Anion Gap: 8 meq/L (ref 3–11)
BUN: 25.6 mg/dL (ref 7.0–26.0)
CO2: 23 meq/L (ref 22–29)
Calcium: 8.8 mg/dL (ref 8.4–10.4)
Chloride: 110 meq/L — ABNORMAL HIGH (ref 98–109)
Creatinine: 1.2 mg/dL — ABNORMAL HIGH (ref 0.6–1.1)
EGFR: 46 ml/min/1.73 m2 — ABNORMAL LOW
Glucose: 142 mg/dL — ABNORMAL HIGH (ref 70–140)
Potassium: 4.3 meq/L (ref 3.5–5.1)
Sodium: 141 meq/L (ref 136–145)
Total Bilirubin: 0.66 mg/dL (ref 0.20–1.20)
Total Protein: 6.6 g/dL (ref 6.4–8.3)

## 2017-05-05 LAB — CBC WITH DIFFERENTIAL/PLATELET
BASO%: 0.6 % (ref 0.0–2.0)
Basophils Absolute: 0 10*3/uL (ref 0.0–0.1)
EOS%: 1.9 % (ref 0.0–7.0)
Eosinophils Absolute: 0.1 10*3/uL (ref 0.0–0.5)
HEMATOCRIT: 40 % (ref 34.8–46.6)
HGB: 13.1 g/dL (ref 11.6–15.9)
LYMPH%: 14.7 % (ref 14.0–49.7)
MCH: 32.4 pg (ref 25.1–34.0)
MCHC: 32.8 g/dL (ref 31.5–36.0)
MCV: 99 fL (ref 79.5–101.0)
MONO#: 0.5 10*3/uL (ref 0.1–0.9)
MONO%: 6.7 % (ref 0.0–14.0)
NEUT#: 5.1 10*3/uL (ref 1.5–6.5)
NEUT%: 76.1 % (ref 38.4–76.8)
PLATELETS: 81 10*3/uL — AB (ref 145–400)
RBC: 4.04 10*6/uL (ref 3.70–5.45)
RDW: 13.5 % (ref 11.2–14.5)
WBC: 6.8 10*3/uL (ref 3.9–10.3)
lymph#: 1 10*3/uL (ref 0.9–3.3)

## 2017-05-05 LAB — MAGNESIUM: Magnesium: 2.3 mg/dL (ref 1.5–2.5)

## 2017-05-05 NOTE — Telephone Encounter (Signed)
-----   Message from Truitt Merle, MD sent at 05/05/2017  2:38 PM EDT ----- Lab results were faxed to [facility] and informed pt also.  Truitt Merle  05/05/2017

## 2017-05-05 NOTE — Telephone Encounter (Signed)
Late entry: Received critical ALT 456. Results faxed to Dr. Nadara Mustard at [facility]. Called triage at [facility] to confirm labs were received. Copy placed on MD desk for review. Pt notified of abnormal labs, per Dr. Burr Medico. Pt stated "They'll call me from [facility] if they want me to do anything."

## 2017-05-08 ENCOUNTER — Telehealth: Payer: Self-pay | Admitting: Family Medicine

## 2017-05-08 NOTE — Telephone Encounter (Signed)
Pharmacy is calling to see if they can get clarification on Synthroid pt has always gotten generic and would like to continue to get the generic.

## 2017-05-12 NOTE — Telephone Encounter (Signed)
Called and spoke with the pharmacy HT and gave them the OK to only refill generic levothyroxine for the pt.

## 2017-05-12 NOTE — Telephone Encounter (Signed)
Tell them that she does get only generic levothyroxine

## 2017-05-13 ENCOUNTER — Other Ambulatory Visit (HOSPITAL_BASED_OUTPATIENT_CLINIC_OR_DEPARTMENT_OTHER): Payer: Medicare PPO

## 2017-05-13 ENCOUNTER — Other Ambulatory Visit: Payer: Self-pay | Admitting: *Deleted

## 2017-05-13 DIAGNOSIS — C9202 Acute myeloblastic leukemia, in relapse: Secondary | ICD-10-CM

## 2017-05-13 DIAGNOSIS — C9200 Acute myeloblastic leukemia, not having achieved remission: Secondary | ICD-10-CM

## 2017-05-13 LAB — CBC WITH DIFFERENTIAL/PLATELET
BASO%: 0.6 % (ref 0.0–2.0)
BASOS ABS: 0 10*3/uL (ref 0.0–0.1)
EOS ABS: 0.1 10*3/uL (ref 0.0–0.5)
EOS%: 1.8 % (ref 0.0–7.0)
HCT: 39.2 % (ref 34.8–46.6)
HGB: 12.9 g/dL (ref 11.6–15.9)
LYMPH%: 15.1 % (ref 14.0–49.7)
MCH: 32.2 pg (ref 25.1–34.0)
MCHC: 32.9 g/dL (ref 31.5–36.0)
MCV: 98.1 fL (ref 79.5–101.0)
MONO#: 0.7 10*3/uL (ref 0.1–0.9)
MONO%: 8.7 % (ref 0.0–14.0)
NEUT#: 5.5 10*3/uL (ref 1.5–6.5)
NEUT%: 73.8 % (ref 38.4–76.8)
Platelets: 83 10*3/uL — ABNORMAL LOW (ref 145–400)
RBC: 4 10*6/uL (ref 3.70–5.45)
RDW: 14.2 % (ref 11.2–14.5)
WBC: 7.5 10*3/uL (ref 3.9–10.3)
lymph#: 1.1 10*3/uL (ref 0.9–3.3)

## 2017-05-14 ENCOUNTER — Other Ambulatory Visit (HOSPITAL_BASED_OUTPATIENT_CLINIC_OR_DEPARTMENT_OTHER): Payer: Medicare PPO

## 2017-05-14 DIAGNOSIS — C9202 Acute myeloblastic leukemia, in relapse: Secondary | ICD-10-CM

## 2017-05-14 DIAGNOSIS — C9200 Acute myeloblastic leukemia, not having achieved remission: Secondary | ICD-10-CM

## 2017-05-14 LAB — COMPREHENSIVE METABOLIC PANEL
ALT: 404 U/L — AB (ref 0–55)
ANION GAP: 9 meq/L (ref 3–11)
AST: 80 U/L — ABNORMAL HIGH (ref 5–34)
Albumin: 3.4 g/dL — ABNORMAL LOW (ref 3.5–5.0)
Alkaline Phosphatase: 183 U/L — ABNORMAL HIGH (ref 40–150)
BUN: 25.5 mg/dL (ref 7.0–26.0)
CALCIUM: 9 mg/dL (ref 8.4–10.4)
CHLORIDE: 111 meq/L — AB (ref 98–109)
CO2: 20 meq/L — AB (ref 22–29)
CREATININE: 1.1 mg/dL (ref 0.6–1.1)
EGFR: 48 mL/min/{1.73_m2} — AB (ref >60)
Glucose: 112 mg/dl (ref 70–140)
POTASSIUM: 3.8 meq/L (ref 3.5–5.1)
Sodium: 140 mEq/L (ref 136–145)
Total Bilirubin: 0.59 mg/dL (ref 0.20–1.20)
Total Protein: 6.6 g/dL (ref 6.4–8.3)

## 2017-05-14 LAB — MAGNESIUM: MAGNESIUM: 2.2 mg/dL (ref 1.5–2.5)

## 2017-05-15 ENCOUNTER — Ambulatory Visit (INDEPENDENT_AMBULATORY_CARE_PROVIDER_SITE_OTHER): Payer: Medicare PPO | Admitting: Family Medicine

## 2017-05-15 ENCOUNTER — Encounter: Payer: Self-pay | Admitting: Family Medicine

## 2017-05-15 VITALS — BP 128/78 | HR 79 | Temp 98.5°F | Ht 60.0 in | Wt 160.0 lb

## 2017-05-15 DIAGNOSIS — I10 Essential (primary) hypertension: Secondary | ICD-10-CM | POA: Diagnosis not present

## 2017-05-15 DIAGNOSIS — E039 Hypothyroidism, unspecified: Secondary | ICD-10-CM

## 2017-05-15 DIAGNOSIS — E782 Mixed hyperlipidemia: Secondary | ICD-10-CM | POA: Diagnosis not present

## 2017-05-15 DIAGNOSIS — D696 Thrombocytopenia, unspecified: Secondary | ICD-10-CM

## 2017-05-15 DIAGNOSIS — C9201 Acute myeloblastic leukemia, in remission: Secondary | ICD-10-CM

## 2017-05-15 LAB — LIPID PANEL
CHOL/HDL RATIO: 4
Cholesterol: 294 mg/dL — ABNORMAL HIGH (ref 0–200)
HDL: 78.9 mg/dL (ref >39.00)
LDL Cholesterol: 184 mg/dL — ABNORMAL HIGH (ref 0–99)
NONHDL: 214.92
TRIGLYCERIDES: 153 mg/dL — AB (ref 0.0–149.0)
VLDL: 30.6 mg/dL (ref 0.0–40.0)

## 2017-05-15 LAB — TSH: TSH: 1.29 u[IU]/mL (ref 0.35–4.50)

## 2017-05-15 MED ORDER — LEVOTHYROXINE SODIUM 50 MCG PO TABS: 50.0000 ug | ORAL_TABLET | Freq: Every day | ORAL | 3 refills | Status: DC

## 2017-05-15 NOTE — Progress Notes (Signed)
   Subjective:    Patient ID: Miranda Mcguire, female    DOB: 1945-08-19, 71 y.o.   MRN: [id number]  HPI Here to follow up. She feels well in general. Her AML is currently in remission again. She had labs a few days ago showing a normal WBC with a normal differential. Hgb is normal. Platelets are low in the 80s but stable. She was started on Atorvastatin about 2 and 1/2 months ago and she took this for 4 weeks. However on repeat labs testing her liver enzymes were quite elevated. She was taken off this 6 weeks ago, and labs earlier this week showed the enzymes coming down a bit. They remain elevated however, and she is scheduled for a liver biopsy on 05-18-17. Shew last saw her Oncologist, Dr. Berton Bon, a few weeks ago.    Review of Systems  Constitutional: Negative.   Respiratory: Negative.   Cardiovascular: Negative.   Gastrointestinal: Negative.   Neurological: Negative.        Objective:   Physical Exam  Constitutional: She appears well-developed and well-nourished.  Neck: No thyromegaly present.  Cardiovascular: Normal rate, regular rhythm, normal heart sounds and intact distal pulses.  Pulmonary/Chest: Effort normal and breath sounds normal. No respiratory distress. She has no wheezes. She has no rales.  Abdominal: Soft. Bowel sounds are normal. She exhibits no distension. There is no tenderness. There is no rebound and no guarding.  Musculoskeletal: She exhibits no edema.  Lymphadenopathy:    She has no cervical adenopathy.          Assessment & Plan:  She has AML  In remission, but she has elevated liver enzymes. She will have the liver biopsy next week. We will keep her off statins in the future. Check a fasting lipid panel today. Check a TSH today tot monitor the hypothyroidism. Her HTN is stable.  Alysia Penna, MD

## 2017-05-15 NOTE — Patient Instructions (Signed)
WE NOW OFFER   [facility] FAST TRACK!!!  SAME DAY Appointments for ACUTE CARE  Such as: Sprains, Injuries, cuts, abrasions, rashes, muscle pain, joint pain, back pain Colds, flu, sore throats, headache, allergies, cough, fever  Ear pain, sinus and eye infections Abdominal pain, nausea, vomiting, diarrhea, upset stomach Animal/insect bites  3 Easy Ways to Schedule: Walk-In Scheduling Call in scheduling Mychart Sign-up: [email or web address]         

## 2017-05-18 ENCOUNTER — Telehealth: Payer: Self-pay | Admitting: *Deleted

## 2017-05-18 NOTE — Telephone Encounter (Signed)
Received vm call from [facility] RN/[facility] stating that pt does not need appt on Wednesday for lab.  Appt cancelled.

## 2017-05-20 ENCOUNTER — Other Ambulatory Visit: Payer: Medicare PPO

## 2017-06-24 ENCOUNTER — Other Ambulatory Visit: Payer: Self-pay | Admitting: *Deleted

## 2017-06-24 ENCOUNTER — Telehealth: Payer: Self-pay | Admitting: Hematology

## 2017-06-24 DIAGNOSIS — C9201 Acute myeloblastic leukemia, in remission: Secondary | ICD-10-CM

## 2017-06-24 NOTE — Telephone Encounter (Signed)
Spoke to patient regarding upcoming December appointments per 12/19 sch message.

## 2017-07-06 ENCOUNTER — Ambulatory Visit (HOSPITAL_BASED_OUTPATIENT_CLINIC_OR_DEPARTMENT_OTHER): Payer: Medicare PPO

## 2017-07-06 ENCOUNTER — Other Ambulatory Visit (HOSPITAL_BASED_OUTPATIENT_CLINIC_OR_DEPARTMENT_OTHER): Payer: Medicare PPO

## 2017-07-06 DIAGNOSIS — C9202 Acute myeloblastic leukemia, in relapse: Secondary | ICD-10-CM

## 2017-07-06 DIAGNOSIS — Z452 Encounter for adjustment and management of vascular access device: Secondary | ICD-10-CM | POA: Diagnosis not present

## 2017-07-06 DIAGNOSIS — C9201 Acute myeloblastic leukemia, in remission: Secondary | ICD-10-CM

## 2017-07-06 DIAGNOSIS — Z95828 Presence of other vascular implants and grafts: Secondary | ICD-10-CM

## 2017-07-06 LAB — CBC WITH DIFFERENTIAL/PLATELET
BASO%: 2.2 % — AB (ref 0.0–2.0)
Basophils Absolute: 0.1 10*3/uL (ref 0.0–0.1)
EOS%: 17.6 % — ABNORMAL HIGH (ref 0.0–7.0)
Eosinophils Absolute: 1.2 10*3/uL — ABNORMAL HIGH (ref 0.0–0.5)
HCT: 37.5 % (ref 34.8–46.6)
HGB: 12.5 g/dL (ref 11.6–15.9)
LYMPH%: 14.2 % (ref 14.0–49.7)
MCH: 32.8 pg (ref 25.1–34.0)
MCHC: 33.2 g/dL (ref 31.5–36.0)
MCV: 98.8 fL (ref 79.5–101.0)
MONO#: 0.7 10*3/uL (ref 0.1–0.9)
MONO%: 10.2 % (ref 0.0–14.0)
NEUT#: 3.7 10*3/uL (ref 1.5–6.5)
NEUT%: 55.8 % (ref 38.4–76.8)
PLATELETS: 77 10*3/uL — AB (ref 145–400)
RBC: 3.79 10*6/uL (ref 3.70–5.45)
RDW: 14.4 % (ref 11.2–14.5)
WBC: 6.7 10*3/uL (ref 3.9–10.3)
lymph#: 1 10*3/uL (ref 0.9–3.3)

## 2017-07-06 LAB — COMPREHENSIVE METABOLIC PANEL
ALBUMIN: 3.4 g/dL — AB (ref 3.5–5.0)
ALK PHOS: 138 U/L (ref 40–150)
ALT: 112 U/L — ABNORMAL HIGH (ref 0–55)
AST: 54 U/L — AB (ref 5–34)
Anion Gap: 8 mEq/L (ref 3–11)
BUN: 16 mg/dL (ref 7.0–26.0)
CHLORIDE: 107 meq/L (ref 98–109)
CO2: 24 meq/L (ref 22–29)
Calcium: 8.9 mg/dL (ref 8.4–10.4)
Creatinine: 1 mg/dL (ref 0.6–1.1)
EGFR: 56 mL/min/{1.73_m2} — AB (ref >60)
GLUCOSE: 112 mg/dL (ref 70–140)
Potassium: 4.1 mEq/L (ref 3.5–5.1)
Sodium: 139 mEq/L (ref 136–145)
Total Bilirubin: 0.49 mg/dL (ref 0.20–1.20)
Total Protein: 6.3 g/dL — ABNORMAL LOW (ref 6.4–8.3)

## 2017-07-06 LAB — MAGNESIUM: MAGNESIUM: 2.1 mg/dL (ref 1.5–2.5)

## 2017-07-06 MED ORDER — SODIUM CHLORIDE 0.9 % IJ SOLN
10.0000 mL | INTRAMUSCULAR | Status: DC | PRN
  Administered 2017-07-06: 10 mL via INTRAVENOUS
  Filled 2017-07-06: qty 10

## 2017-07-06 MED ORDER — HEPARIN SOD (PORK) LOCK FLUSH 100 UNIT/ML IV SOLN
500.0000 [IU] | Freq: Once | INTRAVENOUS | Status: AC | PRN
  Administered 2017-07-06: 500 [IU] via INTRAVENOUS
  Filled 2017-07-06: qty 5

## 2017-07-08 ENCOUNTER — Telehealth: Payer: Self-pay | Admitting: *Deleted

## 2017-07-08 NOTE — Telephone Encounter (Signed)
-----   Message from Truitt Merle, MD sent at 07/07/2017 11:05 PM EST ----- Please let pt know the lab results and fax it to [facility], thanks  Truitt Merle  07/07/2017

## 2017-07-08 NOTE — Telephone Encounter (Signed)
Called pt & she already had lab results from [facility] already has result also.

## 2017-08-19 ENCOUNTER — Encounter: Payer: Self-pay | Admitting: Family Medicine

## 2017-08-19 ENCOUNTER — Ambulatory Visit (INDEPENDENT_AMBULATORY_CARE_PROVIDER_SITE_OTHER): Payer: Medicare PPO | Admitting: Family Medicine

## 2017-08-19 VITALS — BP 130/64 | HR 101 | Temp 98.2°F | Wt 169.2 lb

## 2017-08-19 DIAGNOSIS — I10 Essential (primary) hypertension: Secondary | ICD-10-CM | POA: Diagnosis not present

## 2017-08-19 DIAGNOSIS — E782 Mixed hyperlipidemia: Secondary | ICD-10-CM | POA: Diagnosis not present

## 2017-08-19 DIAGNOSIS — E039 Hypothyroidism, unspecified: Secondary | ICD-10-CM | POA: Diagnosis not present

## 2017-08-19 DIAGNOSIS — C9201 Acute myeloblastic leukemia, in remission: Secondary | ICD-10-CM

## 2017-08-19 LAB — LIPID PANEL
CHOLESTEROL: 228 mg/dL — AB (ref 0–200)
HDL: 57.1 mg/dL (ref >39.00)
LDL Cholesterol: 133 mg/dL — ABNORMAL HIGH (ref 0–99)
NonHDL: 171.07
Total CHOL/HDL Ratio: 4
Triglycerides: 191 mg/dL — ABNORMAL HIGH (ref 0.0–149.0)
VLDL: 38.2 mg/dL (ref 0.0–40.0)

## 2017-08-19 LAB — HEPATIC FUNCTION PANEL
ALBUMIN: 3.6 g/dL (ref 3.5–5.2)
ALK PHOS: 95 U/L (ref 39–117)
ALT: 64 U/L — AB (ref 0–35)
AST: 34 U/L (ref 0–37)
Bilirubin, Direct: 0.1 mg/dL (ref 0.0–0.3)
TOTAL PROTEIN: 5.8 g/dL — AB (ref 6.0–8.3)
Total Bilirubin: 0.6 mg/dL (ref 0.2–1.2)

## 2017-08-19 LAB — TSH: TSH: 4.18 u[IU]/mL (ref 0.35–4.50)

## 2017-08-19 NOTE — Progress Notes (Signed)
   Subjective:    Patient ID: Miranda Mcguire, female    DOB: May 10, 1946, 72 y.o.   MRN: [id number]  HPI Here to follow up. She feels well. Her BP is stable. She had a liver biopsy at [facility] in November which showed an abundance of iron deposition, and this was felt to be the explanation for the elevated liver enzymes she has been showing. The Lipitor was stopped about 3 months ago. The enzyme levels have been slowly coming down. The last AST is see was 54 on 07-06-17 and the ALT was 112.    Review of Systems  Constitutional: Negative.   Respiratory: Negative.   Cardiovascular: Negative.   Gastrointestinal: Negative.   Neurological: Negative.        Objective:   Physical Exam  Constitutional: She is oriented to person, place, and time. She appears well-developed and well-nourished.  Eyes: No scleral icterus.  Cardiovascular: Normal rate, regular rhythm, normal heart sounds and intact distal pulses.  Pulmonary/Chest: Effort normal and breath sounds normal. No respiratory distress. She has no wheezes. She has no rales.  Abdominal: Soft. Bowel sounds are normal. She exhibits no distension and no mass. There is no tenderness. There is no rebound and no guarding.  Neurological: She is alert and oriented to person, place, and time.          Assessment & Plan:  HTN is stable. Her AML remains in remission. We will check a TSH today along with a lipid panel. We are managing the lipids with diet alone at this point.  Alysia Penna, MD

## 2017-08-26 ENCOUNTER — Encounter: Payer: Self-pay | Admitting: Family Medicine

## 2017-08-26 DIAGNOSIS — M810 Age-related osteoporosis without current pathological fracture: Secondary | ICD-10-CM

## 2017-08-28 ENCOUNTER — Other Ambulatory Visit: Payer: Self-pay

## 2017-08-28 ENCOUNTER — Telehealth: Payer: Self-pay | Admitting: Family Medicine

## 2017-08-28 MED ORDER — ATORVASTATIN CALCIUM 20 MG PO TABS: 20.0000 mg | ORAL_TABLET | Freq: Every day | ORAL | 3 refills | Status: DC

## 2017-08-28 NOTE — Telephone Encounter (Signed)
We do not administer Reclast here but our Endocrine clinic does, so He put in a referral to Endocrine for this. We sent in Lipitor 20 MG to your [place].    Called and spoke with pt. Pt advised and voiced understanding.

## 2017-08-28 NOTE — Telephone Encounter (Signed)
We do not administer Reclast here but our Endocrine clinic does, so I put in a referral to Endocrine for this. Also call in Lipitor 20 mg daily, #90 with 3 rf

## 2017-08-28 NOTE — Telephone Encounter (Signed)
Also call in Lipitor 20 mg daily, #90 with 3 rf  Send to [facility] [address]   Rx sent.

## 2017-08-28 NOTE — Telephone Encounter (Signed)
I answered these questions today in a separate message

## 2017-08-28 NOTE — Telephone Encounter (Signed)
Last refilled 08/28/2017 disp 90 with 3 refills sent to [facility] pt should have refills at pharmacy  Sent to PCP to advise about the RECLAST

## 2017-08-28 NOTE — Telephone Encounter (Signed)
Copied from [place] [id number]. Topic: General - Other >> Aug 28, 2017  7:57 AM Carolyn Stare wrote:  Pt call to say she can go back on LIPITOR  20mg  and Dr Nadara Mustard her onocologist wants her to have injection RECLAST. Pt req a call back   [phone]

## 2017-08-31 ENCOUNTER — Encounter: Payer: Self-pay | Admitting: Family Medicine

## 2017-09-01 NOTE — Telephone Encounter (Signed)
Because it will be his decision whether the Reclast is the best option or not

## 2017-09-23 ENCOUNTER — Encounter: Payer: Self-pay | Admitting: Endocrinology

## 2017-09-23 ENCOUNTER — Ambulatory Visit (INDEPENDENT_AMBULATORY_CARE_PROVIDER_SITE_OTHER): Payer: Medicare PPO | Admitting: Endocrinology

## 2017-09-23 VITALS — BP 132/62 | HR 93 | Ht 60.0 in | Wt 167.0 lb

## 2017-09-23 DIAGNOSIS — M810 Age-related osteoporosis without current pathological fracture: Secondary | ICD-10-CM

## 2017-09-23 LAB — VITAMIN D 25 HYDROXY (VIT D DEFICIENCY, FRACTURES): VITD: 26.31 ng/mL — AB (ref 30.00–100.00)
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
## 2017-09-23 NOTE — Patient Instructions (Addendum)
blood tests are requested for you today.  We'll let you know about the results.   Take calcium 1200 mg per day, and vitamin-D, 1000 units per day.   We'll call you when the reclast is approved.  Please redo the bone density in August. Please come back for a follow-up appointment approx 1 week later.

## 2017-09-24 ENCOUNTER — Encounter: Payer: Self-pay | Admitting: Endocrinology

## 2017-09-24 LAB — PTH, INTACT AND CALCIUM
CALCIUM: 8.6 mg/dL (ref 8.6–10.4)
PTH: 113 pg/mL — ABNORMAL HIGH (ref 14–64)

## 2017-09-24 MED ORDER — CALCITRIOL 0.25 MCG PO CAPS: 0.2500 ug | ORAL_CAPSULE | ORAL | 11 refills | Status: DC

## 2017-10-06 ENCOUNTER — Encounter: Payer: Medicare PPO | Admitting: Nutrition

## 2017-10-06 ENCOUNTER — Ambulatory Visit (INDEPENDENT_AMBULATORY_CARE_PROVIDER_SITE_OTHER): Payer: Medicare PPO | Admitting: Endocrinology

## 2017-10-06 ENCOUNTER — Ambulatory Visit: Payer: Medicare PPO

## 2017-10-06 DIAGNOSIS — M810 Age-related osteoporosis without current pathological fracture: Secondary | ICD-10-CM

## 2017-10-07 NOTE — Progress Notes (Signed)
Per Dr. Cordelia Pen note on 09/23/17,and after patient signed to consent form, an IV was started in the patientsrightarm with a 20g needle. Normal saline was infused to determine that the IV was patent, and then 5mg  Reclast was started at 12:15 PM, and infused until 12:40 PM. She reported no symptoms of discomfort during this process. We discussed the need to take Calcium and Vit. D as directed by Dr. Loanne Drilling, and she was encourage to drink at least 6 8oz glasses of water today. She agreed to do this. The IV was then flussed with more normal saline and then D/Ced. The site showed no signs of redness or swelling.         Author:

## 2017-10-07 NOTE — Patient Instructions (Signed)
Drink 6  8 ounces of water today Continue to take your calcium and Vit. D as directed by Dr. Loanne Drilling

## 2017-10-21 ENCOUNTER — Other Ambulatory Visit: Payer: Self-pay | Admitting: Family Medicine

## 2017-11-06 ENCOUNTER — Ambulatory Visit (INDEPENDENT_AMBULATORY_CARE_PROVIDER_SITE_OTHER): Payer: Medicare PPO | Admitting: Family Medicine

## 2017-11-06 ENCOUNTER — Encounter: Payer: Self-pay | Admitting: Family Medicine

## 2017-11-06 VITALS — BP 124/66 | HR 100 | Temp 98.4°F | Resp 16 | Ht 60.0 in | Wt 165.2 lb

## 2017-11-06 DIAGNOSIS — M25552 Pain in left hip: Secondary | ICD-10-CM | POA: Diagnosis not present

## 2017-11-06 MED ORDER — TRAMADOL HCL 50 MG PO TABS: 50.0000 mg | ORAL_TABLET | Freq: Two times a day (BID) | ORAL | 0 refills | Status: AC | PRN

## 2017-11-06 NOTE — Patient Instructions (Signed)
  [name] I have seen you today for an acute visit.  A few things to remember from today's visit:   Hip pain, left - Plan: traMADol (ULTRAM) 50 MG tablet   Medications prescribed today are intended for short period of time and will not be refill upon request, a follow up appointment might be necessary to discuss continuation of of treatment if appropriate.   Tramadol 50 mg at bedtime. Icy hot with Lidocaine or asper cream with Lidocaine.  Tylenol 500 mg 3-4 times per day.  Local ice.   In general please monitor for signs of worsening symptoms and seek immediate medical attention if any concerning.  If symptoms are not resolved in 1-2 weeks you should schedule a follow up appointment with your doctor, before if needed.  I hope you get better soon!

## 2017-11-06 NOTE — Progress Notes (Signed)
ACUTE VISIT   HPI:  Chief Complaint  Patient presents with  . Thigh pain    started yesterday, having troubel walking    Miranda Mcguire is a 72 y.o. female, who is here today complaining of left hip pain that started suddenly last night when she got up from a chair. Pain has been constant since onset. Sharp and throbbing, 8/10,no radiated. Exacerbated by movement and when lying on left side, affecting her sleep. It is alleviated by rest. She has taken Tylenol 500 mg last night and this morning, it helps little.  She has not noticed fever, skin rash, abdominal pain, changes in bowel habits, nausea, or vomiting. This problem is new and has been stable. No numbness or tingling.  No history of trauma.  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 She is concerned about this pain being related to disease. She has not noted edema,erythema,or deformity on affected area.   Review of Systems  Constitutional: Negative for chills, fatigue and fever.  Respiratory: Negative for shortness of breath and wheezing.   Cardiovascular: Negative for leg swelling.  Gastrointestinal: Negative for abdominal pain, nausea and vomiting.  Musculoskeletal: Positive for arthralgias and gait problem.  Neurological: Negative for syncope, weakness and numbness.  Psychiatric/Behavioral: Positive for sleep disturbance. Negative for confusion.      Current Outpatient Medications on File Prior to Visit  Medication Sig Dispense Refill  . amLODipine (NORVASC) 10 MG tablet TAKE ONE TABLET BY MOUTH DAILY 90 tablet 3  . atorvastatin (LIPITOR) 20 MG tablet Take 1 tablet (20 mg total) by mouth daily. 90 tablet 3  . calcitRIOL (ROCALTROL) 0.25 MCG capsule Take 1 capsule (0.25 mcg total) by mouth 3 (three) times a week. 13 capsule 11  . CALCIUM PO Take by mouth.    . levothyroxine (SYNTHROID, LEVOTHROID) 50 MCG tablet Take 1 tablet (50 mcg total) daily by mouth. 90 tablet 3  . loratadine  (CLARITIN) 10 MG tablet Take 10 mg by mouth daily. allertec    . metoprolol tartrate (LOPRESSOR) 25 MG tablet Take 1 tablet (25 mg total) by mouth 2 (two) times daily. 180 tablet 3  . multivitamin (ONE-A-DAY MEN'S) TABS tablet Take 1 tablet by mouth daily.     No current facility-administered medications on file prior to visit.      Past Medical History:  Diagnosis Date  . Allergy   . AML (acute myeloid leukemia) ([employer])    se[facility] Dr[facility] Phill [facility]ron at [facility]   . Anemia    nos  . Cancer ([place])   . Cataracts, bilateral    sees Dr. Baldemar Lenis   . Endometrial polyp    post-menopausal bleeding  . Hyperlipidemia   . Hypertension   . OA (osteoarthritis)   . OP (osteoporosis)    last dexa 08-15-08   Allergies  Allergen Reactions  . Penicillins Other (See Comments)    Gas pocket   . Prunus Persica Hives  . Tape Dermatitis    Blistering - use Mepilex  . Aspirin Hives and Rash    Social History   Socioeconomic History  . Marital status: Married    Spouse name: Not on file  . Number of children: Not on file  . Years of education: Not on file  . Highest education level: Not on file  Occupational History  . Not on file  Social Needs  . Financial resource strain: Not on file  . Food insecurity:    Worry:  Not on file    Inability: Not on file  . Transportation needs:    Medical: Not on file    Non-medical: Not on file  Tobacco Use  . Smoking status: Former Research scientist (life sciences)  . Smokeless tobacco: Never Used  Substance and Sexual Activity  . Alcohol use: No    Alcohol/week: 0.0 oz  . Drug use: No  . Sexual activity: Not on file  Lifestyle  . Physical activity:    Days per week: Not on file    Minutes per session: Not on file  . Stress: Not on file  Relationships  . Social connections:    Talks on phone: Not on file    Gets together: Not on file    Attends religious service: Not on file    Active member of club or organization: Not on file    Attends meetings of clubs or  organizations: Not on file    Relationship status: Not on file  Other Topics Concern  . Not on file  Social History Narrative  . Not on file    Vitals:   11/06/17 0929  BP: 124/66  Pulse: 100  Resp: 16  Temp: 98.4 F (36.9 C)  SpO2: 96%   Body mass index is 32.27 kg/m.   Physical Exam  Nursing note and vitals reviewed. Constitutional: She is oriented to person, place, and time. She appears well-developed. She does not appear ill. No distress.  HENT:  Head: Atraumatic.  Eyes: Conjunctivae are normal.  Cardiovascular: Normal rate and regular rhythm.  Pulses:      Dorsalis pedis pulses are 2+ on the left side.  Respiratory: Effort normal and breath sounds normal. No respiratory distress.  GI: Soft. She exhibits no mass. There is no tenderness.  Musculoskeletal: She exhibits no edema.       Left hip: She exhibits tenderness. She exhibits normal range of motion and normal strength.       Legs: Left hip mild pain elicited upon internal rotation. Pain upon palpation of lateral aspect of hip, around major trochanteric bursa. No length discrepancy. Antalgic gait.  Neurological: She is alert and oriented to person, place, and time. She has normal strength. Coordination normal.  Skin: Skin is warm. No rash noted. No erythema.  Psychiatric: She has a normal mood and affect. Her speech is normal.  Well groomed, good eye contact.    ASSESSMENT AND PLAN:   Ms. Miranda Mcguire was seen today for thigh pain.  Diagnoses and all orders for this visit:  Hip pain, left -     traMADol (ULTRAM) 50 MG tablet; Take 1 tablet (50 mg total) by mouth every 12 (twelve) hours as needed for up to 7 days.    Possible etiologies discussed and treatment options. ?Sprain, trochanteric bursitis among some to consider.   She has appt with hematologist next week.  No Hx of trauma and based on finding today I do not think imaging is needed. Local ice. OTC topical Asper cream with lidocaine may also  help.  Side effects of Tramadol discussed.   Follow up with PCP if not greatly better in 1-2 weeks , before if problem gets worse or new symptom presents.      Dalaina Tates G. [place], MD  [facility]. [place] office.

## 2017-11-17 ENCOUNTER — Ambulatory Visit (INDEPENDENT_AMBULATORY_CARE_PROVIDER_SITE_OTHER): Payer: Medicare PPO | Admitting: Family Medicine

## 2017-11-17 ENCOUNTER — Encounter: Payer: Self-pay | Admitting: Family Medicine

## 2017-11-17 VITALS — BP 118/56 | HR 102 | Temp 98.3°F | Ht 60.0 in | Wt 163.0 lb

## 2017-11-17 DIAGNOSIS — E039 Hypothyroidism, unspecified: Secondary | ICD-10-CM | POA: Diagnosis not present

## 2017-11-17 DIAGNOSIS — E782 Mixed hyperlipidemia: Secondary | ICD-10-CM

## 2017-11-17 LAB — LIPID PANEL
CHOL/HDL RATIO: 3
Cholesterol: 163 mg/dL (ref 0–200)
HDL: 59.5 mg/dL (ref >39.00)
LDL CALC: 75 mg/dL (ref 0–99)
NONHDL: 103.22
Triglycerides: 141 mg/dL (ref 0.0–149.0)
VLDL: 28.2 mg/dL (ref 0.0–40.0)

## 2017-11-17 LAB — HEPATIC FUNCTION PANEL
ALBUMIN: 3.6 g/dL (ref 3.5–5.2)
ALT: 32 U/L (ref 0–35)
AST: 20 U/L (ref 0–37)
Alkaline Phosphatase: 82 U/L (ref 39–117)
Bilirubin, Direct: 0.2 mg/dL (ref 0.0–0.3)
TOTAL PROTEIN: 6.6 g/dL (ref 6.0–8.3)
Total Bilirubin: 0.8 mg/dL (ref 0.2–1.2)

## 2017-11-17 LAB — TSH: TSH: 2.15 u[IU]/mL (ref 0.35–4.50)

## 2017-11-17 LAB — T4, FREE: FREE T4: 0.83 ng/dL (ref 0.60–1.60)

## 2017-11-17 LAB — T3, FREE: T3 FREE: 2.7 pg/mL (ref 2.3–4.2)

## 2017-11-17 NOTE — Progress Notes (Signed)
   Subjective:    Patient ID: Miranda Mcguire, female    DOB: Jun 17, 1946, 72 y.o.   MRN: [id number]  HPI Here to follow up. She feels well. She was here last week for left trochanteric bursitis, and this has resolved. She is getting serial phlebotomies for elevated ferritin levels. She remains in remission from AML. She has been back on Lipitor for the past 3 months, and we will check her labs today.    Review of Systems  Constitutional: Negative.   Respiratory: Negative.   Cardiovascular: Negative.   Neurological: Negative.        Objective:   Physical Exam  Constitutional: She is oriented to person, place, and time. She appears well-developed and well-nourished.  Cardiovascular: Normal rate, regular rhythm, normal heart sounds and intact distal pulses.  Pulmonary/Chest: Effort normal and breath sounds normal.  Neurological: She is alert and oriented to person, place, and time.          Assessment & Plan:  We will check a lipid panel and hepatic enzymes today. Check thyroid levels.  Alysia Penna, MD

## 2017-12-14 ENCOUNTER — Other Ambulatory Visit: Payer: Self-pay | Admitting: Family Medicine

## 2017-12-14 DIAGNOSIS — Z1231 Encounter for screening mammogram for malignant neoplasm of breast: Secondary | ICD-10-CM

## 2017-12-21 ENCOUNTER — Telehealth: Payer: Self-pay | Admitting: Endocrinology

## 2017-12-21 NOTE — Telephone Encounter (Signed)
Patient thinks Dr. Loanne Drilling wanted her to take her calcitriol and come back to do Labs and see Dr. Loanne Drilling. She is not sure if Dr. Loanne Drilling wants her to do labs first and then see her or just do one or the other. Dr. Loanne Drilling had told patient that he wanted to check her when she finished her first bottle of calcitriol which she has (12/23/17 is last dosage). Please call patient at ph# [phone] to advise.

## 2017-12-22 NOTE — Telephone Encounter (Signed)
I have called patient & scheduled her f/u for 8/22.

## 2017-12-22 NOTE — Telephone Encounter (Signed)
Please take calcitriol. Please redo the bone density in August. Please come back for a follow-up appointment approx 1 week later.

## 2018-01-25 ENCOUNTER — Encounter: Payer: Self-pay | Admitting: Family Medicine

## 2018-01-28 NOTE — Telephone Encounter (Signed)
A 6 month follow up from then would be fine

## 2018-01-29 ENCOUNTER — Ambulatory Visit
Admission: RE | Admit: 2018-01-29 | Discharge: 2018-01-29 | Disposition: A | Discharge status: Home / Self Care | Payer: Medicare PPO | Source: Ambulatory Visit | Attending: Family Medicine | Admitting: Family Medicine

## 2018-01-29 DIAGNOSIS — Z1231 Encounter for screening mammogram for malignant neoplasm of breast: Secondary | ICD-10-CM

## 2018-02-15 ENCOUNTER — Telehealth: Payer: Self-pay | Admitting: Emergency Medicine

## 2018-02-15 ENCOUNTER — Telehealth: Payer: Self-pay

## 2018-02-15 NOTE — Telephone Encounter (Signed)
Please see phone note  

## 2018-02-15 NOTE — Telephone Encounter (Signed)
Radiology called and stated they were cancel patient bone density scan due to patient having one in Aug/2018. Please call and let patient know appt was cancelled thanks.

## 2018-02-15 NOTE — Telephone Encounter (Signed)
I spoke with patient to let her know that [facility]. Office called in regards to her bone density scheduled. Patient had one last year & insurance normally doesn't cover cost even though patient is on reclast. This message has been relayed to Dr. Loanne Drilling in regards to scheduling only one year put. I asked that patient call insurance to check & see if they would cover DEXA scan first then we could reschedule. Patient stated that oncologist also suggested that she have one. She will check & let us know.

## 2018-02-17 ENCOUNTER — Other Ambulatory Visit: Payer: Medicare PPO

## 2018-02-18 ENCOUNTER — Inpatient Hospital Stay: Admission: RE | Admit: 2018-02-18 | Payer: Medicare PPO | Source: Ambulatory Visit

## 2018-02-18 ENCOUNTER — Ambulatory Visit (INDEPENDENT_AMBULATORY_CARE_PROVIDER_SITE_OTHER)
Admission: RE | Admit: 2018-02-18 | Discharge: 2018-02-18 | Disposition: A | Discharge status: Home / Self Care | Payer: Medicare PPO | Source: Ambulatory Visit | Attending: Endocrinology | Admitting: Endocrinology

## 2018-02-18 DIAGNOSIS — M810 Age-related osteoporosis without current pathological fracture: Secondary | ICD-10-CM | POA: Diagnosis not present

## 2018-02-25 ENCOUNTER — Ambulatory Visit (INDEPENDENT_AMBULATORY_CARE_PROVIDER_SITE_OTHER): Payer: Medicare PPO | Admitting: Endocrinology

## 2018-02-25 ENCOUNTER — Encounter: Payer: Self-pay | Admitting: Endocrinology

## 2018-02-25 VITALS — BP 102/50 | HR 87 | Ht 60.0 in | Wt 160.0 lb

## 2018-02-25 DIAGNOSIS — M810 Age-related osteoporosis without current pathological fracture: Secondary | ICD-10-CM | POA: Diagnosis not present

## 2018-02-25 LAB — VITAMIN D 25 HYDROXY (VIT D DEFICIENCY, FRACTURES): VITD: 23.75 ng/mL — AB (ref 30.00–100.00)

## 2018-02-25 NOTE — Patient Instructions (Addendum)
blood tests are requested for you today.  We'll let you know about the results. When you see the oncologist, please ask about adding Prolia.   Please come back for a follow-up appointment in 1 year.
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
## 2018-02-26 LAB — PTH, INTACT AND CALCIUM
CALCIUM: 9 mg/dL (ref 8.6–10.4)
PTH: 81 pg/mL — ABNORMAL HIGH (ref 14–64)

## 2018-03-11 ENCOUNTER — Encounter: Payer: Self-pay | Admitting: Endocrinology

## 2018-03-16 ENCOUNTER — Ambulatory Visit: Payer: Medicare PPO

## 2018-03-16 ENCOUNTER — Ambulatory Visit (INDEPENDENT_AMBULATORY_CARE_PROVIDER_SITE_OTHER): Payer: Medicare PPO | Admitting: *Deleted

## 2018-03-16 DIAGNOSIS — Z23 Encounter for immunization: Secondary | ICD-10-CM | POA: Diagnosis not present

## 2018-03-17 ENCOUNTER — Encounter: Payer: Self-pay | Admitting: Endocrinology

## 2018-03-23 ENCOUNTER — Other Ambulatory Visit: Payer: Self-pay | Admitting: *Deleted

## 2018-03-23 ENCOUNTER — Encounter: Payer: Self-pay | Admitting: Family Medicine

## 2018-03-23 ENCOUNTER — Telehealth: Payer: Self-pay | Admitting: Endocrinology

## 2018-03-23 MED ORDER — METOPROLOL TARTRATE 25 MG PO TABS: 25.0000 mg | ORAL_TABLET | Freq: Two times a day (BID) | ORAL | 3 refills | Status: DC

## 2018-03-23 NOTE — Telephone Encounter (Signed)
Dr. Sarajane Jews,  I need refill for Metoprolol Tartrate 25 mg tab, 90 days. This was originally from Dr. Nadara Mustard at [facility] but she wants all non-cancer meds to come from you. Please send to [facility]. Thanks, Marveen Reeks   Dr. Sarajane Jews, please advise. Thanks

## 2018-03-23 NOTE — Telephone Encounter (Signed)
Dr. Loanne Drilling wants patient to get Prolia-per patient. Patient's ph# [phone]

## 2018-03-26 NOTE — Telephone Encounter (Signed)
Patient is calling to advise this medication is not at her pharmacy she  Is needing this medication. Please advise

## 2018-03-29 ENCOUNTER — Telehealth: Payer: Self-pay | Admitting: Family Medicine

## 2018-03-29 MED ORDER — METOPROLOL TARTRATE 25 MG PO TABS: 25.0000 mg | ORAL_TABLET | Freq: Two times a day (BID) | ORAL | 0 refills | Status: DC

## 2018-03-29 NOTE — Telephone Encounter (Signed)
Copied from [place] [id number]. Topic: Quick Communication - See Telephone Encounter >> Mar 29, 2018  9:37 AM Percell Belt A wrote: CRM for notification. See Telephone encounter for: 03/29/18. [facility] called and stated that they have not rec'd the metoprolol tartrate (LOPRESSOR) 25 MG tablet [[id number]]  Can this be resent today?   [phone]

## 2018-03-29 NOTE — Telephone Encounter (Signed)
Copied from [place] [id number]. Topic: General - Other >> Mar 29, 2018  9:34 AM Janace Aris A wrote: Medication: metoprolol tartrate (LOPRESSOR) 25 MG tablet  Has the patient contacted their pharmacy? Yes,  patient is saying the pharmacy is not receiving the order. She would like the order called in to ensure they get it.   Preferred Pharmacy (with phone number or street name): [facility] [address] [place] - [address]  [phone] (Phone) [phone] (Fax)

## 2018-03-29 NOTE — Telephone Encounter (Signed)
This was sent in to the pharmacy at 11:23

## 2018-03-29 NOTE — Telephone Encounter (Signed)
Metoprolol tartrate refill Last Refill:03/23/18 # 180 3 RF Last OV: 05/15/17 PCP: Dr Sarajane Jews Pharmacy:[facility] [address]. Pt has upcoming appt 05/21/18. Filled for 90 days.

## 2018-03-31 ENCOUNTER — Encounter: Payer: Self-pay | Admitting: Endocrinology

## 2018-04-02 NOTE — Telephone Encounter (Signed)
Spoke with Patient and let her know we are awaiting info from insurance/amgen regarding cost and approval

## 2018-04-08 NOTE — Telephone Encounter (Signed)
prolia approval was submitted 04/08/18

## 2018-04-10 ENCOUNTER — Encounter: Payer: Self-pay | Admitting: Endocrinology

## 2018-04-12 ENCOUNTER — Encounter: Payer: Self-pay | Admitting: Endocrinology

## 2018-04-12 ENCOUNTER — Telehealth: Payer: Self-pay

## 2018-04-12 NOTE — Telephone Encounter (Signed)
Pt spoke with [facility] and stated she doesn't have another insurance and don't kn0w where we got that from. Pt was very upset because she hasn't gotten an appt for her prolia

## 2018-04-12 NOTE — Telephone Encounter (Signed)
Patient owes $93 for Prolia at check-in per Joycelyn Schmid and is ready to be scheduled for a nurse visit any time- attempted to schedule the patient but no VM was set up

## 2018-04-12 NOTE — Telephone Encounter (Signed)
Spoke to the patient this morning and made her aware that she needs to call [facility] and let them know she does not have any other insurance- she agreed to do this

## 2018-04-13 NOTE — Telephone Encounter (Addendum)
Patient scheduled for Prolia on 04/14/18- spoke with the patient and she was very angry making derogatory statements about how long it took for her to get Prolia- I tried to explain to her that the hold up was with her insurance and I had a letter that stated the problem- the patient continued to be nasty stating we were just lazy- I let her know she would receive a copy of the letter when she came in for the nurse visit-she agreed  I also informed patient of the [place] that she could apply for to help cover the cost of Prolia but she did not want to hear anything I had to say at this point and she hung up the phone

## 2018-04-13 NOTE — Telephone Encounter (Signed)
Patient scheduled for Prolia injection- will give her letter from insurance company stating she needs to contact them

## 2018-04-14 ENCOUNTER — Ambulatory Visit: Payer: Medicare PPO

## 2018-04-14 DIAGNOSIS — M810 Age-related osteoporosis without current pathological fracture: Secondary | ICD-10-CM

## 2018-04-14 MED ORDER — DENOSUMAB 60 MG/ML ~~LOC~~ SOSY
60.0000 mg | PREFILLED_SYRINGE | Freq: Once | SUBCUTANEOUS | Status: AC
  Administered 2018-04-14: 60 mg via SUBCUTANEOUS

## 2018-04-14 MED ORDER — DENOSUMAB 60 MG/ML ~~LOC~~ SOSY: 60.0000 mg | PREFILLED_SYRINGE | Freq: Once | SUBCUTANEOUS | 0 refills | Status: DC

## 2018-04-14 NOTE — Progress Notes (Signed)
Per orders of Dr. Loanne Drilling injection of prolia 60 mg /mL given in left arm today by Lolita Rieger RMA . Patient tolerated injection well.

## 2018-04-21 ENCOUNTER — Telehealth: Payer: Self-pay | Admitting: Family Medicine

## 2018-04-21 NOTE — Telephone Encounter (Signed)
Best number [phone] Pt has question on form you told her to fill out  Injection prolia last week Needs codes for prolia Amount was billied to insurer

## 2018-04-22 ENCOUNTER — Telehealth: Payer: Self-pay

## 2018-04-22 NOTE — Telephone Encounter (Signed)
Prior Auth started through Cover My Meds for Prolia. Will await response.

## 2018-04-22 NOTE — Telephone Encounter (Signed)
I have let given the patient the needed information.

## 2018-05-21 ENCOUNTER — Encounter: Payer: Self-pay | Admitting: Family Medicine

## 2018-05-21 ENCOUNTER — Ambulatory Visit (INDEPENDENT_AMBULATORY_CARE_PROVIDER_SITE_OTHER): Payer: Medicare PPO | Admitting: Family Medicine

## 2018-05-21 VITALS — BP 108/50 | HR 50 | Temp 97.6°F | Ht 60.0 in | Wt 162.4 lb

## 2018-05-21 DIAGNOSIS — J069 Acute upper respiratory infection, unspecified: Secondary | ICD-10-CM | POA: Diagnosis not present

## 2018-05-21 DIAGNOSIS — E039 Hypothyroidism, unspecified: Secondary | ICD-10-CM

## 2018-05-21 DIAGNOSIS — E782 Mixed hyperlipidemia: Secondary | ICD-10-CM | POA: Diagnosis not present

## 2018-05-21 DIAGNOSIS — I10 Essential (primary) hypertension: Secondary | ICD-10-CM

## 2018-05-21 LAB — LIPID PANEL
CHOL/HDL RATIO: 3
Cholesterol: 176 mg/dL (ref 0–200)
HDL: 59.4 mg/dL (ref >39.00)
LDL CALC: 91 mg/dL (ref 0–99)
NONHDL: 116.89
TRIGLYCERIDES: 127 mg/dL (ref 0.0–149.0)
VLDL: 25.4 mg/dL (ref 0.0–40.0)

## 2018-05-21 LAB — BASIC METABOLIC PANEL
BUN: 26 mg/dL — ABNORMAL HIGH (ref 6–23)
CHLORIDE: 108 meq/L (ref 96–112)
CO2: 28 meq/L (ref 19–32)
Calcium: 9.2 mg/dL (ref 8.4–10.5)
Creatinine, Ser: 1.23 mg/dL — ABNORMAL HIGH (ref 0.40–1.20)
GFR: 45.35 mL/min — ABNORMAL LOW (ref >60.00)
Glucose, Bld: 112 mg/dL — ABNORMAL HIGH (ref 70–99)
POTASSIUM: 4.5 meq/L (ref 3.5–5.1)
SODIUM: 145 meq/L (ref 135–145)

## 2018-05-21 LAB — HEPATIC FUNCTION PANEL
ALBUMIN: 4 g/dL (ref 3.5–5.2)
ALT: 27 U/L (ref 0–35)
AST: 20 U/L (ref 0–37)
Alkaline Phosphatase: 62 U/L (ref 39–117)
Bilirubin, Direct: 0.1 mg/dL (ref 0.0–0.3)
TOTAL PROTEIN: 6.6 g/dL (ref 6.0–8.3)
Total Bilirubin: 0.5 mg/dL (ref 0.2–1.2)

## 2018-05-21 LAB — T3, FREE: T3 FREE: 2.8 pg/mL (ref 2.3–4.2)

## 2018-05-21 LAB — TSH: TSH: 3.13 u[IU]/mL (ref 0.35–4.50)

## 2018-05-21 LAB — T4, FREE: FREE T4: 0.78 ng/dL (ref 0.60–1.60)

## 2018-05-21 MED ORDER — LEVOTHYROXINE SODIUM 50 MCG PO TABS: 50.0000 ug | ORAL_TABLET | Freq: Every day | ORAL | 3 refills | Status: DC

## 2018-05-21 MED ORDER — BENZONATATE 200 MG PO CAPS: 200.0000 mg | ORAL_CAPSULE | Freq: Two times a day (BID) | ORAL | 2 refills | Status: DC | PRN

## 2018-05-21 NOTE — Progress Notes (Signed)
   Subjective:    Patient ID: Miranda Mcguire, female    DOB: 14-Sep-1945, 72 y.o.   MRN: [id number]  HPI Here to follow up. She has been doing well in general. She sees her Oncologist regularly. They have been watching her platelet count climb slowly but steadily. Most recently it was 143. She has had a cold for a week (like her husband) with PND and a dry cough. No fever. She feels better today.    Review of Systems  Constitutional: Negative.   HENT: Positive for congestion and postnasal drip. Negative for sinus pressure, sinus pain and sore throat.   Eyes: Negative.   Respiratory: Positive for cough.   Cardiovascular: Negative.   Neurological: Negative.        Objective:   Physical Exam  Constitutional: She appears well-developed and well-nourished.  HENT:  Right Ear: External ear normal.  Left Ear: External ear normal.  Nose: Nose normal.  Mouth/Throat: Oropharynx is clear and moist.  Eyes: Conjunctivae are normal.  Neck: No thyromegaly present.  Cardiovascular: Normal rate, regular rhythm, normal heart sounds and intact distal pulses.  Pulmonary/Chest: Effort normal and breath sounds normal. No stridor. No respiratory distress. She has no wheezes. She has no rales.  Lymphadenopathy:    She has no cervical adenopathy.          Assessment & Plan:  Her viral URI seems to be resolving. Her HTN is stable. Get fasting labs to check her lipids and thyroid level.  Alysia Penna, MD

## 2018-06-19 ENCOUNTER — Other Ambulatory Visit: Payer: Self-pay | Admitting: Family Medicine

## 2018-08-03 ENCOUNTER — Encounter: Payer: Self-pay | Admitting: Family Medicine

## 2018-08-09 ENCOUNTER — Other Ambulatory Visit: Payer: Self-pay | Admitting: Family Medicine

## 2018-08-13 ENCOUNTER — Telehealth: Payer: Self-pay

## 2018-08-13 NOTE — Telephone Encounter (Signed)
Author spoke with pt. about awv, but pt. was not interested in participating, stating that she did not feel like she needed the extra preventative care visit. Reminded of CPE with Dr. Sarajane Jews 2/10.

## 2018-08-16 ENCOUNTER — Ambulatory Visit (INDEPENDENT_AMBULATORY_CARE_PROVIDER_SITE_OTHER): Payer: Medicare PPO | Admitting: Family Medicine

## 2018-08-16 ENCOUNTER — Encounter: Payer: Self-pay | Admitting: Family Medicine

## 2018-08-16 VITALS — BP 124/86 | HR 88 | Temp 98.2°F | Ht 60.0 in | Wt 165.4 lb

## 2018-08-16 DIAGNOSIS — D696 Thrombocytopenia, unspecified: Secondary | ICD-10-CM | POA: Diagnosis not present

## 2018-08-16 DIAGNOSIS — E039 Hypothyroidism, unspecified: Secondary | ICD-10-CM

## 2018-08-16 DIAGNOSIS — M150 Primary generalized (osteo)arthritis: Secondary | ICD-10-CM

## 2018-08-16 DIAGNOSIS — M810 Age-related osteoporosis without current pathological fracture: Secondary | ICD-10-CM

## 2018-08-16 DIAGNOSIS — M159 Polyosteoarthritis, unspecified: Secondary | ICD-10-CM

## 2018-08-16 DIAGNOSIS — E782 Mixed hyperlipidemia: Secondary | ICD-10-CM

## 2018-08-16 DIAGNOSIS — I10 Essential (primary) hypertension: Secondary | ICD-10-CM

## 2018-08-16 DIAGNOSIS — C9201 Acute myeloblastic leukemia, in remission: Secondary | ICD-10-CM

## 2018-08-16 LAB — POC URINALSYSI DIPSTICK (AUTOMATED)
Bilirubin, UA: NEGATIVE
Blood, UA: NEGATIVE
GLUCOSE UA: NEGATIVE
KETONES UA: NEGATIVE
Leukocytes, UA: NEGATIVE
Nitrite, UA: NEGATIVE
Protein, UA: NEGATIVE
SPEC GRAV UA: 1.02 (ref 1.010–1.025)
Urobilinogen, UA: 0.2 E.U./dL
pH, UA: 5.5 (ref 5.0–8.0)

## 2018-08-16 LAB — LIPID PANEL
Cholesterol: 204 mg/dL — ABNORMAL HIGH (ref 0–200)
HDL: 61.3 mg/dL
LDL Cholesterol: 110 mg/dL — ABNORMAL HIGH (ref 0–99)
NonHDL: 143.12
Total CHOL/HDL Ratio: 3
Triglycerides: 168 mg/dL — ABNORMAL HIGH (ref 0.0–149.0)
VLDL: 33.6 mg/dL (ref 0.0–40.0)

## 2018-08-16 LAB — BASIC METABOLIC PANEL
BUN: 23 mg/dL (ref 6–23)
CHLORIDE: 106 meq/L (ref 96–112)
CO2: 27 mEq/L (ref 19–32)
CREATININE: 1.16 mg/dL (ref 0.40–1.20)
Calcium: 9.3 mg/dL (ref 8.4–10.5)
GFR: 45.63 mL/min — ABNORMAL LOW (ref >60.00)
Glucose, Bld: 110 mg/dL — ABNORMAL HIGH (ref 70–99)
Potassium: 4.8 mEq/L (ref 3.5–5.1)
Sodium: 144 mEq/L (ref 135–145)

## 2018-08-16 LAB — HEPATIC FUNCTION PANEL
ALT: 21 U/L (ref 0–35)
AST: 17 U/L (ref 0–37)
Albumin: 4.2 g/dL (ref 3.5–5.2)
Alkaline Phosphatase: 61 U/L (ref 39–117)
BILIRUBIN DIRECT: 0.1 mg/dL (ref 0.0–0.3)
BILIRUBIN TOTAL: 0.7 mg/dL (ref 0.2–1.2)
Total Protein: 6.7 g/dL (ref 6.0–8.3)

## 2018-08-16 LAB — TSH: TSH: 2.2 u[IU]/mL (ref 0.35–4.50)

## 2018-08-16 NOTE — Progress Notes (Signed)
Subjective:    Patient ID: Miranda Mcguire, female    DOB: [birth year], 73 y.o.   MRN: [id number]  HPI Here to follow up on issues. She feels well in general. She sees Dr. Berton Bon at [facility] for iron over load. She gets phlebotomies every month. Her recent Hgb was 12.9 and ferritin has come down to 954. Her platelets are stable at 183. She is coming up on her 2 year anniversary of remission from AML next month. Her BP I stable. She sees Dr. Loanne Drilling regularly for Osteoporosis care and vitamin D supplementation. She had been having some nighttime cramps in the feet, but after starting on OTC supplements for potassium and magnesium, these have stopped.    Review of Systems  Constitutional: Negative.   HENT: Negative.   Eyes: Negative.   Respiratory: Negative.   Cardiovascular: Negative.   Gastrointestinal: Negative.   Genitourinary: Negative for decreased urine volume, difficulty urinating, dyspareunia, dysuria, enuresis, flank pain, frequency, hematuria, pelvic pain and urgency.  Musculoskeletal: Negative.   Skin: Negative.   Neurological: Negative.   Psychiatric/Behavioral: Negative.        Objective:   Physical Exam Constitutional:      General: She is not in acute distress.    Appearance: She is well-developed.  HENT:     Head: Normocephalic and atraumatic.     Right Ear: External ear normal.     Left Ear: External ear normal.     Nose: Nose normal.     Mouth/Throat:     Pharynx: No oropharyngeal exudate.  Eyes:     General: No scleral icterus.    Conjunctiva/sclera: Conjunctivae normal.     Pupils: Pupils are equal, round, and reactive to light.  Neck:     Musculoskeletal: Normal range of motion and neck supple.     Thyroid: No thyromegaly.     Vascular: No JVD.  Cardiovascular:     Rate and Rhythm: Normal rate and regular rhythm.     Heart sounds: Normal heart sounds. No murmur. No friction rub. No gallop.   Pulmonary:     Effort:  Pulmonary effort is normal. No respiratory distress.     Breath sounds: Normal breath sounds. No wheezing or rales.  Chest:     Chest wall: No tenderness.  Abdominal:     General: Bowel sounds are normal. There is no distension.     Palpations: Abdomen is soft. There is no mass.     Tenderness: There is no abdominal tenderness. There is no guarding or rebound.  Musculoskeletal: Normal range of motion.        General: No tenderness.  Lymphadenopathy:     Cervical: No cervical adenopathy.  Skin:    General: Skin is warm and dry.     Findings: No erythema or rash.  Neurological:     Mental Status: She is alert and oriented to person, place, and time.     Cranial Nerves: No cranial nerve deficit.     Motor: No abnormal muscle tone.     Coordination: Coordination normal.     Deep Tendon Reflexes: Reflexes are normal and symmetric. Reflexes normal.  Psychiatric:        Behavior: Behavior normal.        Thought Content: Thought content normal.        Judgment: Judgment normal.           Assessment & Plan:  Her AML remains in remission. Her iron overload  is being treated at [facility] as well. Her HTN is stable. We will get fasting labs to including lipids, etc.  Alysia Penna, MD

## 2018-09-16 ENCOUNTER — Other Ambulatory Visit: Payer: Self-pay | Admitting: Endocrinology

## 2018-09-17 ENCOUNTER — Other Ambulatory Visit: Payer: Self-pay | Admitting: Family Medicine

## 2018-09-23 ENCOUNTER — Telehealth: Payer: Self-pay | Admitting: *Deleted

## 2018-09-23 MED ORDER — DICLOFENAC SODIUM 75 MG PO TBEC: 75.0000 mg | DELAYED_RELEASE_TABLET | Freq: Two times a day (BID) | ORAL | 2 refills | Status: DC | PRN

## 2018-09-23 NOTE — Telephone Encounter (Signed)
Call in Diclofenac 75 mg to take bid prn pain, #60 with 2 rf

## 2018-09-23 NOTE — Telephone Encounter (Signed)
Copied from [place] [phone]. Topic: General - Other >> Sep 23, 2018 10:48 AM Carolyn Stare wrote:  Pt is having joint pain and said her oncologist told her to ask her pcp about nasaid  maybe [place]

## 2018-09-23 NOTE — Telephone Encounter (Signed)
Rx sent. Patient is aware.  

## 2018-09-24 ENCOUNTER — Encounter: Payer: Self-pay | Admitting: Family Medicine

## 2018-09-27 ENCOUNTER — Telehealth: Payer: Self-pay | Admitting: Family Medicine

## 2018-09-27 MED ORDER — DICLOFENAC SODIUM 2 % TD SOLN: TRANSDERMAL | 5 refills | Status: DC

## 2018-09-27 NOTE — Telephone Encounter (Signed)
Dr. Sarajane Jews please advise on the voltaren gel.  Thanks

## 2018-09-27 NOTE — Telephone Encounter (Signed)
I understand. Cancel the Diclofenac tablets. Instead call in Diclofenac 2% gel to apply bid as needed, 30 days with 5 rf

## 2018-09-27 NOTE — Telephone Encounter (Signed)
Copied from [place] [id number]. Topic: Quick Communication - See Telephone Encounter >> Sep 27, 2018  4:53 PM Bea Graff, NT wrote: CRM for notification. See Telephone encounter for: 09/27/18. Kristopher Oppenheim calling and states the pt is requesting theDiclofenac Sodium 1 % gel instead of the Diclofenac Sodium 2 % SOLN. Please advise.

## 2018-09-28 ENCOUNTER — Telehealth: Payer: Self-pay

## 2018-09-28 MED ORDER — DICLOFENAC SODIUM 1 % TD GEL: TRANSDERMAL | 5 refills | Status: DC

## 2018-09-28 NOTE — Telephone Encounter (Signed)
Dr. Sarajane Jews please advise if we can change the diclofenac gel to the 1% instead of the 2%.  Thanks

## 2018-09-28 NOTE — Telephone Encounter (Signed)
Yes the 1% is fine

## 2018-09-28 NOTE — Telephone Encounter (Signed)
PA for Diclofenac Sodium 2 % SOLN has been sent to cover my meds.  KeyEldridge Scot - PA Case ID: [id number] - Rx #: [id number]

## 2018-10-05 ENCOUNTER — Telehealth: Payer: Self-pay | Admitting: Endocrinology

## 2018-10-05 NOTE — Telephone Encounter (Signed)
Patient calling about getting her Reclast and Prolia shots. Would like a call back at [phone]

## 2018-10-11 ENCOUNTER — Encounter: Payer: Self-pay | Admitting: Endocrinology

## 2018-10-11 NOTE — Telephone Encounter (Signed)
Hi Stephanie,  Can you please help this pt with her request.  Thanks, Seon Gaertner

## 2018-10-12 NOTE — Telephone Encounter (Signed)
PA has been denied.  You asked for the drug listed above for your Primary generalized (osteo)arthritis. [employer] follows Medicare rules. The Medicare rule in the Prescription Drug Manual (Chapter 6, Section 10.6) says an off-label use of a drug is a use that is not included on the drugs label as approved by the U.S. Food and Drug Administration (FDA). FDA approved drugs used for a disease other than what is on the official label may be covered under Medicare if [employer] determines the use to be medically accepted. [employer] makes these decisions on a case-by-case basis. We take into consideration the two major drug compendia. These compendia (or drug guides) are called the [place] and the [place] (AHFS-DI). Off-label use is medically accepted when there is evidence in one or more of the compendia that it works for the disease in question. [employer] has determined that the off-label use of this drug for your condition is not medically accepted per Medicare rules and isn&apos;t covered based on available information.

## 2018-10-12 NOTE — Telephone Encounter (Signed)
That is disappointing. Of course she can pay cash for this if she wishes.

## 2018-10-13 ENCOUNTER — Encounter: Payer: Self-pay | Admitting: Family Medicine

## 2018-10-13 DIAGNOSIS — M810 Age-related osteoporosis without current pathological fracture: Secondary | ICD-10-CM

## 2018-10-13 NOTE — Telephone Encounter (Signed)
Ammie would you please call this patient and get the correct insurance information for her? The work Lattie Haw did to get this approved came back that the insurance was not accurate. I will update the system just asking if you would please get the information for me please

## 2018-10-13 NOTE — Telephone Encounter (Signed)
Please refer to pt comment and concern

## 2018-10-14 ENCOUNTER — Other Ambulatory Visit: Payer: Self-pay | Admitting: Family Medicine

## 2018-10-14 NOTE — Telephone Encounter (Signed)
I would be happy to refer her to a new endocrinologist. Have her check with her insurance company as to who is covered on their panel, and then let me know

## 2018-10-14 NOTE — Telephone Encounter (Signed)
Dr. Fry please advise. Thanks  

## 2018-10-18 NOTE — Telephone Encounter (Signed)
Dr. Sarajane Jews please advise.  Dr. Buddy Duty is covered by her insurance.   Thanks

## 2018-10-18 NOTE — Telephone Encounter (Signed)
I did the referral to Dr. Buddy Duty yes she should obtain her records from Dr. Cordelia Pen office

## 2018-10-20 NOTE — Telephone Encounter (Signed)
Spoke with [place] at [facility] she is stating that the prolia is not 100% covered unless there is a Prior auth that is completed.   # [phone] fax # [phone]

## 2018-10-20 NOTE — Telephone Encounter (Signed)
Please refer to information below:  1. Will need you to compose a letter for me to send to insurance for PA request:   Spoke with [place] at [facility] she is stating that the prolia is not 100% covered unless there is a Prior auth that is completed.   # [phone] fax # [phone]  2. Please advise for Vaughan Basta if this infusion is essential and the patient should be scheduled during this time.

## 2018-10-20 NOTE — Telephone Encounter (Signed)
I have spoke with [facility] PA is required pt is set up for 10/27/2018 to have the prolia to lessen her frustration but we must get this PA done asap and submitted. Lattie Haw please work on this in the allotted time tomorrow.

## 2018-10-20 NOTE — Telephone Encounter (Signed)
Spoke with patient states she is 100% covered for prolia contrary to what we are being told. I let her know this and she is calling the insurance to call me so I have a name that can be associated for this injection. I set her up for the injection of prolia for tomorrow afternoon.   Patient also states she is covered 100% for reclast and let her know that is done thru [facility] and that is put on hold at the moment due to trying to limit patients due to covid.  Please advise for Vaughan Basta if this infusion is essential and the patient should be scheduled during this time.

## 2018-10-20 NOTE — Telephone Encounter (Signed)
Patient called this AM, I had a pt at my window I asked her to hold, she hung up, I called back and she did not answer. Need her insurance info in order to process prolia....cannot move forward with anything until she gives [place] this info

## 2018-10-21 ENCOUNTER — Ambulatory Visit: Payer: Medicare PPO

## 2018-10-21 DIAGNOSIS — R7989 Other specified abnormal findings of blood chemistry: Secondary | ICD-10-CM | POA: Diagnosis not present

## 2018-10-21 DIAGNOSIS — N289 Disorder of kidney and ureter, unspecified: Secondary | ICD-10-CM | POA: Diagnosis not present

## 2018-10-21 DIAGNOSIS — Z9481 Bone marrow transplant status: Secondary | ICD-10-CM | POA: Diagnosis not present

## 2018-10-21 DIAGNOSIS — C9201 Acute myeloblastic leukemia, in remission: Secondary | ICD-10-CM | POA: Diagnosis not present

## 2018-10-25 ENCOUNTER — Telehealth: Payer: Self-pay | Admitting: Endocrinology

## 2018-10-25 NOTE — Telephone Encounter (Signed)
Called pt for clarification of request. States she did not call for refill of test strips nor does she understand what this has to do with her Prolia. Apologized for the confusion. Reiterated she did not ask for any refills for [facility].

## 2018-10-25 NOTE — Telephone Encounter (Signed)
Pt needs a refill on Test strips please she is out please call into [facility]

## 2018-10-27 ENCOUNTER — Ambulatory Visit: Payer: Medicare PPO

## 2018-10-27 ENCOUNTER — Other Ambulatory Visit: Payer: Self-pay

## 2018-10-27 DIAGNOSIS — M810 Age-related osteoporosis without current pathological fracture: Secondary | ICD-10-CM

## 2018-10-27 MED ORDER — DENOSUMAB 60 MG/ML ~~LOC~~ SOSY
60.0000 mg | PREFILLED_SYRINGE | Freq: Once | SUBCUTANEOUS | Status: AC
  Administered 2020-05-03: 60 mg via SUBCUTANEOUS

## 2018-10-27 NOTE — Progress Notes (Signed)
Per orders of Dr. Ellison injection of Prolia given today by L. Walker CMA . Patient tolerated injection well. 

## 2018-10-31 ENCOUNTER — Other Ambulatory Visit: Payer: Self-pay | Admitting: Family Medicine

## 2018-10-31 ENCOUNTER — Encounter: Payer: Self-pay | Admitting: Endocrinology

## 2018-11-01 NOTE — Telephone Encounter (Signed)
Please refer to Dr. Cordelia Pen response. Pt does need BOTH and does need an appt for BOTH.

## 2018-11-02 ENCOUNTER — Telehealth: Payer: Self-pay | Admitting: Nutrition

## 2018-11-02 NOTE — Telephone Encounter (Signed)
Appointment scheduled for Reclast for tomorrow at [place]

## 2018-11-03 ENCOUNTER — Other Ambulatory Visit: Payer: Self-pay

## 2018-11-03 ENCOUNTER — Ambulatory Visit (INDEPENDENT_AMBULATORY_CARE_PROVIDER_SITE_OTHER): Payer: Medicare PPO | Admitting: Endocrinology

## 2018-11-03 DIAGNOSIS — H04123 Dry eye syndrome of bilateral lacrimal glands: Secondary | ICD-10-CM | POA: Diagnosis not present

## 2018-11-03 DIAGNOSIS — M810 Age-related osteoporosis without current pathological fracture: Secondary | ICD-10-CM

## 2018-11-03 DIAGNOSIS — H40013 Open angle with borderline findings, low risk, bilateral: Secondary | ICD-10-CM | POA: Diagnosis not present

## 2018-11-04 ENCOUNTER — Encounter: Payer: Self-pay | Admitting: Family Medicine

## 2018-11-06 ENCOUNTER — Other Ambulatory Visit: Payer: Self-pay | Admitting: Family Medicine

## 2018-11-07 ENCOUNTER — Encounter: Payer: Self-pay | Admitting: Family Medicine

## 2018-11-08 NOTE — Patient Instructions (Signed)
Drink 4-6 glasses of water today Continue to take your Calcium and Vit. D per Dr. Cordelia Pen orders Call if questions or problems

## 2018-11-08 NOTE — Progress Notes (Addendum)
Per Dr. Cordelia Pen message on 11/01/18 , and after the patient signed the consent, an IV of Normal Saline was started in the patient left arm. After determining that the iv was intact, 5mg . of Zoledronic acid was started at 1:45PM.  The patient reported no discomfort during the infusion.  The infusion ended at 2:20.  Normal saline was infused for 2 minutes, and the IV was discontinued.  The site showed no signes of redness or swelling.  Patient was encouraged to drink plenty of water today and to continue her Calcium and Vit. D per Dr. Cordelia Pen order.  She agreed to do this and had no final questions. Leonia Reader, RN, BSN

## 2018-11-15 NOTE — Progress Notes (Signed)
I cannot close visit.

## 2018-11-18 DIAGNOSIS — C9201 Acute myeloblastic leukemia, in remission: Secondary | ICD-10-CM | POA: Diagnosis not present

## 2018-11-18 DIAGNOSIS — Z9481 Bone marrow transplant status: Secondary | ICD-10-CM | POA: Diagnosis not present

## 2018-11-18 DIAGNOSIS — R7989 Other specified abnormal findings of blood chemistry: Secondary | ICD-10-CM | POA: Diagnosis not present

## 2018-12-16 ENCOUNTER — Other Ambulatory Visit: Payer: Self-pay | Admitting: Family Medicine

## 2018-12-16 DIAGNOSIS — I129 Hypertensive chronic kidney disease with stage 1 through stage 4 chronic kidney disease, or unspecified chronic kidney disease: Secondary | ICD-10-CM | POA: Diagnosis not present

## 2018-12-16 DIAGNOSIS — Z9484 Stem cells transplant status: Secondary | ICD-10-CM | POA: Diagnosis not present

## 2018-12-16 DIAGNOSIS — Z9481 Bone marrow transplant status: Secondary | ICD-10-CM | POA: Diagnosis not present

## 2018-12-16 DIAGNOSIS — Z1231 Encounter for screening mammogram for malignant neoplasm of breast: Secondary | ICD-10-CM

## 2018-12-16 DIAGNOSIS — R7989 Other specified abnormal findings of blood chemistry: Secondary | ICD-10-CM | POA: Diagnosis not present

## 2018-12-16 DIAGNOSIS — N189 Chronic kidney disease, unspecified: Secondary | ICD-10-CM | POA: Diagnosis not present

## 2018-12-16 DIAGNOSIS — R739 Hyperglycemia, unspecified: Secondary | ICD-10-CM | POA: Diagnosis not present

## 2018-12-16 DIAGNOSIS — C9201 Acute myeloblastic leukemia, in remission: Secondary | ICD-10-CM | POA: Diagnosis not present

## 2018-12-16 DIAGNOSIS — E785 Hyperlipidemia, unspecified: Secondary | ICD-10-CM | POA: Diagnosis not present

## 2019-01-13 DIAGNOSIS — Z9481 Bone marrow transplant status: Secondary | ICD-10-CM | POA: Diagnosis not present

## 2019-01-13 DIAGNOSIS — C9201 Acute myeloblastic leukemia, in remission: Secondary | ICD-10-CM | POA: Diagnosis not present

## 2019-01-29 ENCOUNTER — Other Ambulatory Visit: Payer: Self-pay | Admitting: Family Medicine

## 2019-02-02 ENCOUNTER — Other Ambulatory Visit: Payer: Self-pay

## 2019-02-02 ENCOUNTER — Ambulatory Visit
Admission: RE | Admit: 2019-02-02 | Discharge: 2019-02-02 | Disposition: A | Discharge status: Home / Self Care | Payer: Medicare PPO | Source: Ambulatory Visit | Attending: Family Medicine | Admitting: Family Medicine

## 2019-02-02 DIAGNOSIS — Z1231 Encounter for screening mammogram for malignant neoplasm of breast: Secondary | ICD-10-CM

## 2019-02-04 ENCOUNTER — Other Ambulatory Visit: Payer: Self-pay | Admitting: Family Medicine

## 2019-02-07 ENCOUNTER — Other Ambulatory Visit: Payer: Self-pay

## 2019-02-07 ENCOUNTER — Encounter: Payer: Self-pay | Admitting: Family Medicine

## 2019-02-07 ENCOUNTER — Ambulatory Visit (INDEPENDENT_AMBULATORY_CARE_PROVIDER_SITE_OTHER): Payer: Medicare PPO | Admitting: Family Medicine

## 2019-02-07 VITALS — BP 120/64 | HR 125 | Temp 98.4°F | Wt 167.8 lb

## 2019-02-07 DIAGNOSIS — E782 Mixed hyperlipidemia: Secondary | ICD-10-CM | POA: Diagnosis not present

## 2019-02-07 DIAGNOSIS — M720 Palmar fascial fibromatosis [Dupuytren]: Secondary | ICD-10-CM

## 2019-02-07 DIAGNOSIS — E039 Hypothyroidism, unspecified: Secondary | ICD-10-CM

## 2019-02-07 DIAGNOSIS — I10 Essential (primary) hypertension: Secondary | ICD-10-CM | POA: Diagnosis not present

## 2019-02-07 LAB — LIPID PANEL
Cholesterol: 211 mg/dL — ABNORMAL HIGH (ref 0–200)
HDL: 63.7 mg/dL (ref >39.00)
LDL Cholesterol: 116 mg/dL — ABNORMAL HIGH (ref 0–99)
NonHDL: 147.65
Total CHOL/HDL Ratio: 3
Triglycerides: 159 mg/dL — ABNORMAL HIGH (ref 0.0–149.0)
VLDL: 31.8 mg/dL (ref 0.0–40.0)

## 2019-02-07 LAB — HEPATIC FUNCTION PANEL
ALT: 25 U/L (ref 0–35)
AST: 18 U/L (ref 0–37)
Albumin: 4.2 g/dL (ref 3.5–5.2)
Alkaline Phosphatase: 57 U/L (ref 39–117)
Bilirubin, Direct: 0.1 mg/dL (ref 0.0–0.3)
Total Bilirubin: 0.6 mg/dL (ref 0.2–1.2)
Total Protein: 6.5 g/dL (ref 6.0–8.3)

## 2019-02-07 LAB — BASIC METABOLIC PANEL
BUN: 28 mg/dL — ABNORMAL HIGH (ref 6–23)
CO2: 28 mEq/L (ref 19–32)
Calcium: 10.3 mg/dL (ref 8.4–10.5)
Chloride: 104 mEq/L (ref 96–112)
Creatinine, Ser: 1.23 mg/dL — ABNORMAL HIGH (ref 0.40–1.20)
GFR: 42.59 mL/min — ABNORMAL LOW (ref >60.00)
Glucose, Bld: 108 mg/dL — ABNORMAL HIGH (ref 70–99)
Potassium: 4.4 mEq/L (ref 3.5–5.1)
Sodium: 142 mEq/L (ref 135–145)

## 2019-02-07 LAB — TSH: TSH: 1.91 u[IU]/mL (ref 0.35–4.50)

## 2019-02-07 LAB — T3, FREE: T3, Free: 3.1 pg/mL (ref 2.3–4.2)

## 2019-02-07 LAB — T4, FREE: Free T4: 1.19 ng/dL (ref 0.60–1.60)

## 2019-02-07 MED ORDER — ATORVASTATIN CALCIUM 20 MG PO TABS: 20.0000 mg | ORAL_TABLET | Freq: Every day | ORAL | 3 refills | Status: DC

## 2019-02-07 NOTE — Progress Notes (Signed)
   Subjective:    Patient ID: Miranda Mcguire, female    DOB: Apr 20, 1946, 73 y.o.   MRN: [id number]  HPI Here to follow up. She is doing well in general, although she complains about pain in the left hand and sometimes the third finger gets stuck. Her BP is stable. She is still going to [facility] for phlebotomies.    Review of Systems  Constitutional: Negative.   Respiratory: Negative.   Cardiovascular: Negative.   Neurological: Negative.        Objective:   Physical Exam Constitutional:      Appearance: Normal appearance.  Cardiovascular:     Rate and Rhythm: Normal rate and regular rhythm.     Pulses: Normal pulses.     Heart sounds: Normal heart sounds.  Pulmonary:     Effort: Pulmonary effort is normal.     Breath sounds: Normal breath sounds.  Musculoskeletal:     Comments: The left palm shows a thickened flexor tendon for the 3rd finger. ROM is full   Neurological:     Mental Status: She is alert.           Assessment & Plan:  Her HTN is stable. We will get labs for lipids, a thyroid panel, etc. Refer to Hand Surgery for the Dupuytrens contracture. Alysia Penna, MD

## 2019-02-10 DIAGNOSIS — Z9484 Stem cells transplant status: Secondary | ICD-10-CM | POA: Diagnosis not present

## 2019-02-10 DIAGNOSIS — C9201 Acute myeloblastic leukemia, in remission: Secondary | ICD-10-CM | POA: Diagnosis not present

## 2019-02-16 DIAGNOSIS — M720 Palmar fascial fibromatosis [Dupuytren]: Secondary | ICD-10-CM | POA: Diagnosis not present

## 2019-02-16 DIAGNOSIS — M65332 Trigger finger, left middle finger: Secondary | ICD-10-CM | POA: Diagnosis not present

## 2019-02-16 DIAGNOSIS — M79642 Pain in left hand: Secondary | ICD-10-CM | POA: Diagnosis not present

## 2019-02-24 ENCOUNTER — Other Ambulatory Visit: Payer: Self-pay

## 2019-02-28 ENCOUNTER — Ambulatory Visit (INDEPENDENT_AMBULATORY_CARE_PROVIDER_SITE_OTHER): Payer: Medicare PPO | Admitting: Endocrinology

## 2019-02-28 ENCOUNTER — Other Ambulatory Visit: Payer: Self-pay

## 2019-02-28 ENCOUNTER — Encounter: Payer: Self-pay | Admitting: Endocrinology

## 2019-02-28 VITALS — BP 124/60 | HR 100 | Ht 60.0 in | Wt 171.8 lb

## 2019-02-28 DIAGNOSIS — E559 Vitamin D deficiency, unspecified: Secondary | ICD-10-CM | POA: Diagnosis not present

## 2019-02-28 DIAGNOSIS — M810 Age-related osteoporosis without current pathological fracture: Secondary | ICD-10-CM

## 2019-02-28 NOTE — Patient Instructions (Addendum)
blood tests are requested for you today.  We'll let you know about the results.   Please recheck the bone density.   Please come back for a follow-up appointment in 1 year.
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
## 2019-03-01 LAB — VITAMIN D 25 HYDROXY (VIT D DEFICIENCY, FRACTURES): VITD: 38.27 ng/mL (ref 30.00–100.00)

## 2019-03-01 LAB — PTH, INTACT AND CALCIUM
Calcium: 9.4 mg/dL (ref 8.6–10.4)
PTH: 48 pg/mL (ref 14–64)

## 2019-03-02 ENCOUNTER — Other Ambulatory Visit: Payer: Self-pay

## 2019-03-02 ENCOUNTER — Ambulatory Visit (INDEPENDENT_AMBULATORY_CARE_PROVIDER_SITE_OTHER)
Admission: RE | Admit: 2019-03-02 | Discharge: 2019-03-02 | Disposition: A | Discharge status: Home / Self Care | Payer: Medicare PPO | Source: Ambulatory Visit | Attending: Endocrinology | Admitting: Endocrinology

## 2019-03-02 DIAGNOSIS — M810 Age-related osteoporosis without current pathological fracture: Secondary | ICD-10-CM | POA: Diagnosis not present

## 2019-03-10 DIAGNOSIS — N189 Chronic kidney disease, unspecified: Secondary | ICD-10-CM | POA: Diagnosis not present

## 2019-03-10 DIAGNOSIS — Z9481 Bone marrow transplant status: Secondary | ICD-10-CM | POA: Diagnosis not present

## 2019-03-10 DIAGNOSIS — M810 Age-related osteoporosis without current pathological fracture: Secondary | ICD-10-CM | POA: Diagnosis not present

## 2019-03-10 DIAGNOSIS — I120 Hypertensive chronic kidney disease with stage 5 chronic kidney disease or end stage renal disease: Secondary | ICD-10-CM | POA: Diagnosis not present

## 2019-03-10 DIAGNOSIS — R7989 Other specified abnormal findings of blood chemistry: Secondary | ICD-10-CM | POA: Diagnosis not present

## 2019-03-10 DIAGNOSIS — C9201 Acute myeloblastic leukemia, in remission: Secondary | ICD-10-CM | POA: Diagnosis not present

## 2019-03-10 DIAGNOSIS — E785 Hyperlipidemia, unspecified: Secondary | ICD-10-CM | POA: Diagnosis not present

## 2019-03-10 DIAGNOSIS — Z79899 Other long term (current) drug therapy: Secondary | ICD-10-CM | POA: Diagnosis not present

## 2019-03-10 DIAGNOSIS — E039 Hypothyroidism, unspecified: Secondary | ICD-10-CM | POA: Diagnosis not present

## 2019-03-11 DIAGNOSIS — R7989 Other specified abnormal findings of blood chemistry: Secondary | ICD-10-CM | POA: Diagnosis not present

## 2019-03-11 DIAGNOSIS — Z9481 Bone marrow transplant status: Secondary | ICD-10-CM | POA: Diagnosis not present

## 2019-03-11 DIAGNOSIS — C9201 Acute myeloblastic leukemia, in remission: Secondary | ICD-10-CM | POA: Diagnosis not present

## 2019-03-30 DIAGNOSIS — M65331 Trigger finger, right middle finger: Secondary | ICD-10-CM | POA: Diagnosis not present

## 2019-03-30 DIAGNOSIS — M65332 Trigger finger, left middle finger: Secondary | ICD-10-CM | POA: Diagnosis not present

## 2019-03-30 DIAGNOSIS — M720 Palmar fascial fibromatosis [Dupuytren]: Secondary | ICD-10-CM | POA: Diagnosis not present

## 2019-04-07 DIAGNOSIS — R7989 Other specified abnormal findings of blood chemistry: Secondary | ICD-10-CM | POA: Diagnosis not present

## 2019-04-07 DIAGNOSIS — C9201 Acute myeloblastic leukemia, in remission: Secondary | ICD-10-CM | POA: Diagnosis not present

## 2019-04-07 DIAGNOSIS — Z9481 Bone marrow transplant status: Secondary | ICD-10-CM | POA: Diagnosis not present

## 2019-04-11 DIAGNOSIS — H348312 Tributary (branch) retinal vein occlusion, right eye, stable: Secondary | ICD-10-CM | POA: Diagnosis not present

## 2019-04-11 DIAGNOSIS — H40013 Open angle with borderline findings, low risk, bilateral: Secondary | ICD-10-CM | POA: Diagnosis not present

## 2019-04-11 DIAGNOSIS — H35371 Puckering of macula, right eye: Secondary | ICD-10-CM | POA: Diagnosis not present

## 2019-04-11 DIAGNOSIS — H353132 Nonexudative age-related macular degeneration, bilateral, intermediate dry stage: Secondary | ICD-10-CM | POA: Diagnosis not present

## 2019-04-11 DIAGNOSIS — H35341 Macular cyst, hole, or pseudohole, right eye: Secondary | ICD-10-CM | POA: Diagnosis not present

## 2019-04-16 ENCOUNTER — Encounter: Payer: Self-pay | Admitting: Family Medicine

## 2019-04-19 ENCOUNTER — Other Ambulatory Visit: Payer: Self-pay

## 2019-04-19 MED ORDER — AMLODIPINE BESYLATE 10 MG PO TABS: 10.0000 mg | ORAL_TABLET | Freq: Every day | ORAL | 2 refills | Status: DC

## 2019-04-19 MED ORDER — LEVOTHYROXINE SODIUM 50 MCG PO TABS: 50.0000 ug | ORAL_TABLET | Freq: Every day | ORAL | 3 refills | Status: DC

## 2019-04-26 ENCOUNTER — Ambulatory Visit (INDEPENDENT_AMBULATORY_CARE_PROVIDER_SITE_OTHER): Payer: Medicare PPO

## 2019-04-26 ENCOUNTER — Ambulatory Visit: Payer: Medicare PPO

## 2019-04-26 ENCOUNTER — Other Ambulatory Visit: Payer: Self-pay

## 2019-04-26 DIAGNOSIS — Z23 Encounter for immunization: Secondary | ICD-10-CM | POA: Diagnosis not present

## 2019-04-29 ENCOUNTER — Ambulatory Visit (INDEPENDENT_AMBULATORY_CARE_PROVIDER_SITE_OTHER): Payer: Medicare PPO

## 2019-04-29 ENCOUNTER — Other Ambulatory Visit: Payer: Self-pay

## 2019-04-29 DIAGNOSIS — M810 Age-related osteoporosis without current pathological fracture: Secondary | ICD-10-CM

## 2019-04-29 MED ORDER — DENOSUMAB 60 MG/ML ~~LOC~~ SOSY
60.0000 mg | PREFILLED_SYRINGE | Freq: Once | SUBCUTANEOUS | Status: AC
  Administered 2019-04-29: 60 mg via SUBCUTANEOUS

## 2019-04-29 NOTE — Progress Notes (Signed)
Per orders of Dr. Gherghe injection of Prolia given today by Travian Kerner, Certified Medical Assistant . Patient tolerated injection well.  

## 2019-05-11 ENCOUNTER — Encounter: Payer: Self-pay | Admitting: Family Medicine

## 2019-05-12 DIAGNOSIS — R7989 Other specified abnormal findings of blood chemistry: Secondary | ICD-10-CM | POA: Diagnosis not present

## 2019-05-12 DIAGNOSIS — Z9481 Bone marrow transplant status: Secondary | ICD-10-CM | POA: Diagnosis not present

## 2019-05-12 DIAGNOSIS — C9201 Acute myeloblastic leukemia, in remission: Secondary | ICD-10-CM | POA: Diagnosis not present

## 2019-05-16 ENCOUNTER — Telehealth: Payer: Self-pay | Admitting: Family Medicine

## 2019-05-16 MED ORDER — LIDOCAINE-PRILOCAINE 2.5-2.5 % EX CREA: 1.0000 "application " | TOPICAL_CREAM | CUTANEOUS | 5 refills | Status: DC | PRN

## 2019-05-16 NOTE — Telephone Encounter (Signed)
Okay for refill?  

## 2019-05-16 NOTE — Telephone Encounter (Signed)
Patient is requesting a new refill for medication Lidocaine-Prilocaine Cream. It is generic for EMLA Cream. 2.5% Dr.Ellison prescribed this to her but patient  states that Dr.Ellison advised her to get this medication through PCP   [place] [address], [place] - [address] [phone] (Phone) [phone] (Fax)

## 2019-05-16 NOTE — Telephone Encounter (Signed)
Ok for refill? 

## 2019-05-16 NOTE — Telephone Encounter (Signed)
Done

## 2019-06-09 DIAGNOSIS — Z79899 Other long term (current) drug therapy: Secondary | ICD-10-CM | POA: Diagnosis not present

## 2019-06-09 DIAGNOSIS — R7989 Other specified abnormal findings of blood chemistry: Secondary | ICD-10-CM | POA: Diagnosis not present

## 2019-07-14 DIAGNOSIS — C9201 Acute myeloblastic leukemia, in remission: Secondary | ICD-10-CM | POA: Diagnosis not present

## 2019-07-14 DIAGNOSIS — Z9481 Bone marrow transplant status: Secondary | ICD-10-CM | POA: Diagnosis not present

## 2019-07-14 DIAGNOSIS — R7989 Other specified abnormal findings of blood chemistry: Secondary | ICD-10-CM | POA: Diagnosis not present

## 2019-07-25 ENCOUNTER — Other Ambulatory Visit: Payer: Self-pay | Admitting: Family Medicine

## 2019-07-26 ENCOUNTER — Encounter: Payer: Self-pay | Admitting: Family Medicine

## 2019-07-26 ENCOUNTER — Ambulatory Visit (INDEPENDENT_AMBULATORY_CARE_PROVIDER_SITE_OTHER): Payer: Medicare PPO | Admitting: Family Medicine

## 2019-07-26 ENCOUNTER — Ambulatory Visit: Payer: Self-pay

## 2019-07-26 ENCOUNTER — Other Ambulatory Visit: Payer: Self-pay

## 2019-07-26 VITALS — BP 124/68 | HR 89 | Temp 97.7°F | Ht 60.0 in | Wt 174.6 lb

## 2019-07-26 DIAGNOSIS — B029 Zoster without complications: Secondary | ICD-10-CM | POA: Diagnosis not present

## 2019-07-26 MED ORDER — VALACYCLOVIR HCL 1 G PO TABS: 1000.0000 mg | ORAL_TABLET | Freq: Three times a day (TID) | ORAL | 0 refills | Status: DC

## 2019-07-26 NOTE — Patient Instructions (Signed)
Shingles  Shingles, which is also known as herpes zoster, is an infection that causes a painful skin rash and fluid-filled blisters. It is caused by a virus. Shingles only develops in people who:  Have had chickenpox.  Have been given a medicine to protect against chickenpox (have been vaccinated). Shingles is rare in this group. What are the causes? Shingles is caused by varicella-zoster virus (VZV). This is the same virus that causes chickenpox. After a person is exposed to VZV, the virus stays in the body in an inactive (dormant) state. Shingles develops if the virus is reactivated. This can happen many years after the first (initial) exposure to VZV. It is not known what causes this virus to be reactivated. What increases the risk? People who have had chickenpox or received the chickenpox vaccine are at risk for shingles. Shingles infection is more common in people who:  Are older than age 60.  Have a weakened disease-fighting system (immune system), such as people with: ? HIV. ? AIDS. ? Cancer.  Are taking medicines that weaken the immune system, such as transplant medicines.  Are experiencing a lot of stress. What are the signs or symptoms? Early symptoms of this condition include itching, tingling, and pain in an area on your skin. Pain may be described as burning, stabbing, or throbbing. A few days or weeks after early symptoms start, a painful red rash appears. The rash is usually on one side of the body and has a band-like or belt-like pattern. The rash eventually turns into fluid-filled blisters that break open, change into scabs, and dry up in about 2-3 weeks. At any time during the infection, you may also develop:  A fever.  Chills.  A headache.  An upset stomach. How is this diagnosed? This condition is diagnosed with a skin exam. Skin or fluid samples may be taken from the blisters before a diagnosis is made. These samples are examined under a microscope or sent to  a lab for testing. How is this treated? The rash may last for several weeks. There is not a specific cure for this condition. Your health care provider will probably prescribe medicines to help you manage pain, recover more quickly, and avoid long-term problems. Medicines may include:  Antiviral drugs.  Anti-inflammatory drugs.  Pain medicines.  Anti-itching medicines (antihistamines). If the area involved is on your face, you may be referred to a specialist, such as an eye doctor (ophthalmologist) or an ear, nose, and throat (ENT) doctor (otolaryngologist) to help you avoid eye problems, chronic pain, or disability. Follow these instructions at home: Medicines  Take over-the-counter and prescription medicines only as told by your health care provider.  Apply an anti-itch cream or numbing cream to the affected area as told by your health care provider. Relieving itching and discomfort   Apply cold, wet cloths (cold compresses) to the area of the rash or blisters as told by your health care provider.  Cool baths can be soothing. Try adding baking soda or dry oatmeal to the water to reduce itching. Do not bathe in hot water. Blister and rash care  Keep your rash covered with a loose bandage (dressing). Wear loose-fitting clothing to help ease the pain of material rubbing against the rash.  Keep your rash and blisters clean by washing the area with mild soap and cool water as told by your health care provider.  Check your rash every day for signs of infection. Check for: ? More redness, swelling, or pain. ? Fluid   or blood. ? Warmth. ? Pus or a bad smell.  Do not scratch your rash or pick at your blisters. To help avoid scratching: ? Keep your fingernails clean and cut short. ? Wear gloves or mittens while you sleep, if scratching is a problem. General instructions  Rest as told by your health care provider.  Keep all follow-up visits as told by your health care provider. This  is important.  Wash your hands often with soap and water. If soap and water are not available, use hand sanitizer. Doing this lowers your chance of getting a bacterial skin infection.  Before your blisters change into scabs, your shingles infection can cause chickenpox in people who have never had it or have never been vaccinated against it. To prevent this from happening, avoid contact with other people, especially: ? Babies. ? Pregnant women. ? Children who have eczema. ? Elderly people who have transplants. ? People who have chronic illnesses, such as cancer or AIDS. Contact a health care provider if:  Your pain is not relieved with prescribed medicines.  Your pain does not get better after the rash heals.  You have signs of infection in the rash area, such as: ? More redness, swelling, or pain around the rash. ? Fluid or blood coming from the rash. ? The rash area feeling warm to the touch. ? Pus or a bad smell coming from the rash. Get help right away if:  The rash is on your face or nose.  You have facial pain, pain around your eye area, or loss of feeling on one side of your face.  You have difficulty seeing.  You have ear pain or have ringing in your ear.  You have a loss of taste.  Your condition gets worse. Summary  Shingles, which is also known as herpes zoster, is an infection that causes a painful skin rash and fluid-filled blisters.  This condition is diagnosed with a skin exam. Skin or fluid samples may be taken from the blisters and examined before the diagnosis is made.  Keep your rash covered with a loose bandage (dressing). Wear loose-fitting clothing to help ease the pain of material rubbing against the rash.  Before your blisters change into scabs, your shingles infection can cause chickenpox in people who have never had it or have never been vaccinated against it. This information is not intended to replace advice given to you by your health care  provider. Make sure you discuss any questions you have with your health care provider. Document Revised: 10/15/2018 Document Reviewed: 02/25/2017 Elsevier Patient Education  2020 Elsevier Inc.  

## 2019-07-26 NOTE — Progress Notes (Signed)
  Subjective:     Patient ID: Miranda Mcguire, female   DOB: 12/19/1945, 74 y.o.   MRN: [id number]  HPI Miranda Mcguire is seen with rash which she first noticed on Sunday on her right forehead.  She noticed the second patch just inferior to the first rash which was at the scalp line.  She has not had any eye involvement.  No blurred vision.  No red eye.  No other rashes.  No fevers or chills.  No headache.  She has had both Zostavax as well as Shingrix vaccines previously.  Current rash is slightly painful but very mild pain which she rates as 1 out of 10  Past Medical History:  Diagnosis Date  . Allergy   . 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 Phill Myron at [facility]   . Anemia    nos  . Cancer ([place])   . Cataracts, bilateral    sees Dr. Baldemar Lenis   . Endometrial polyp    post-menopausal bleeding  . Hyperlipidemia   . Hypertension   . OA (osteoarthritis)   . OP (osteoporosis)    last dexa 08-15-08   Past Surgical History:  Procedure Laterality Date  . COLONOSCOPY  08/06/2016   at [facility], benign polyps, repeat in 5 yrs   . DILATION AND CURETTAGE OF UTERUS    . endometrial polypectomy     with D and C 03-08-09 pr dr Gardenia Phlegm  . herniated disc     L4-5 per Dr Saintclair Halsted  . TONSILECTOMY, ADENOIDECTOMY, [place]      reports that she has quit smoking. She has never used smokeless tobacco. She reports that she does not drink alcohol or use drugs. family history includes Osteoporosis in her sister; Stroke in an other family member. Allergies  Allergen Reactions  . Other   . Penicillins Other (See Comments)    Gas pocket   . Prunus Persica Hives  . Tape Dermatitis    Blistering - use Mepilex  . Aspirin Hives and Rash     Review of Systems  Constitutional: Negative for chills and fever.  Eyes: Negative for visual disturbance.  Respiratory: Negative for shortness of breath.   Cardiovascular: Negative for chest pain.  Skin:  Positive for rash.  Neurological: Negative for headaches.       Objective:   Physical Exam Vitals reviewed.  Constitutional:      Appearance: Normal appearance.  Cardiovascular:     Rate and Rhythm: Normal rate and regular rhythm.  Pulmonary:     Effort: Pulmonary effort is normal.     Breath sounds: Normal breath sounds.  Skin:    Comments: She has slightly raised vesicular rash in a cluster right forehead just right of the midline.  She has some slightly raised similar rash just inferior and lateral to that on the forehead.  No periocular involvement.  No nasal involvement  Neurological:     Mental Status: She is alert.        Assessment:     Shingles involving right forehead.  She has no evidence for eye involvement and pain is very mild at this time    Plan:     -Start Valtrex 1 g 3 times daily for 7 days -Follow-up immediately for any blurred vision, eye pain, or eye redness -Keep clean with soap and water  Eulas Post MD [facility]

## 2019-07-26 NOTE — Telephone Encounter (Signed)
    Incoming call from Patient with a complaint of a rash developing around her hair line and near her eye.   States there is about 4 to 5 . About the size of a nickle.  Notice Sunday night.   Itches a little  Denies pain. No other Sx  Transferred Patient to Office to make an appointment.      Reason for Disposition . [1] Localized rash is very painful AND [2] no fever  Answer Assessment - Initial Assessment Questions 1. APPEARANCE of RASH: "Describe the rash."  littlle pimples  Red blothch  2. LOCATION: "Where is the rash located?"     Around hairline 3. NUMBER: "How many spots are there?"     4 or five on top.  4. SIZE: "How big are the spots?" (Inches, centimeters or compare to size of a coin)      Size of nicle 5. ONSET: "When did the rash start?"      Notice Sunday night. 6. ITCHING: "Does the rash itch?" If so, ask: "How bad is the itch?"  (Scale 1-10; or mild, moderate, severe)    itching 7. PAIN: "Does the rash hurt?" If so, ask: "How bad is the pain?"  (Scale 1-10; or mild, moderate, severe)    denies 8. OTHER SYMPTOMS: "Do you have any other symptoms?" (e.g., fever)    denies 9. PREGNANCY: "Is there any chance you are pregnant?" "When was your last menstrual period?"   na  Protocols used: RASH OR REDNESS - LOCALIZED-A-AH

## 2019-07-26 NOTE — Telephone Encounter (Signed)
Patient had an appointment for this with Dr. Elease Hashimoto. Nothing further needed.

## 2019-07-29 ENCOUNTER — Other Ambulatory Visit: Payer: Self-pay | Admitting: Family Medicine

## 2019-08-01 ENCOUNTER — Ambulatory Visit: Payer: Medicare PPO | Admitting: Family Medicine

## 2019-08-12 DIAGNOSIS — E785 Hyperlipidemia, unspecified: Secondary | ICD-10-CM | POA: Diagnosis not present

## 2019-08-12 DIAGNOSIS — M810 Age-related osteoporosis without current pathological fracture: Secondary | ICD-10-CM | POA: Diagnosis not present

## 2019-08-12 DIAGNOSIS — I120 Hypertensive chronic kidney disease with stage 5 chronic kidney disease or end stage renal disease: Secondary | ICD-10-CM | POA: Diagnosis not present

## 2019-08-12 DIAGNOSIS — C9201 Acute myeloblastic leukemia, in remission: Secondary | ICD-10-CM | POA: Diagnosis not present

## 2019-08-12 DIAGNOSIS — E039 Hypothyroidism, unspecified: Secondary | ICD-10-CM | POA: Diagnosis not present

## 2019-08-12 DIAGNOSIS — Z79899 Other long term (current) drug therapy: Secondary | ICD-10-CM | POA: Diagnosis not present

## 2019-08-12 DIAGNOSIS — Z9484 Stem cells transplant status: Secondary | ICD-10-CM | POA: Diagnosis not present

## 2019-08-12 DIAGNOSIS — N189 Chronic kidney disease, unspecified: Secondary | ICD-10-CM | POA: Diagnosis not present

## 2019-08-12 DIAGNOSIS — Z9481 Bone marrow transplant status: Secondary | ICD-10-CM | POA: Diagnosis not present

## 2019-08-16 DIAGNOSIS — Z9481 Bone marrow transplant status: Secondary | ICD-10-CM | POA: Diagnosis not present

## 2019-08-16 DIAGNOSIS — C9201 Acute myeloblastic leukemia, in remission: Secondary | ICD-10-CM | POA: Diagnosis not present

## 2019-08-22 ENCOUNTER — Encounter: Payer: Medicare PPO | Admitting: Family Medicine

## 2019-09-08 ENCOUNTER — Telehealth: Payer: Self-pay | Admitting: Endocrinology

## 2019-09-08 DIAGNOSIS — C9201 Acute myeloblastic leukemia, in remission: Secondary | ICD-10-CM | POA: Diagnosis not present

## 2019-09-08 DIAGNOSIS — M810 Age-related osteoporosis without current pathological fracture: Secondary | ICD-10-CM

## 2019-09-08 DIAGNOSIS — Z9481 Bone marrow transplant status: Secondary | ICD-10-CM | POA: Diagnosis not present

## 2019-09-08 DIAGNOSIS — R7989 Other specified abnormal findings of blood chemistry: Secondary | ICD-10-CM | POA: Diagnosis not present

## 2019-09-08 NOTE — Telephone Encounter (Signed)
Patient requests to be called at ph# [phone] to schedule a reclast.

## 2019-09-09 ENCOUNTER — Other Ambulatory Visit: Payer: Self-pay | Admitting: Family Medicine

## 2019-09-14 ENCOUNTER — Telehealth: Payer: Self-pay | Admitting: Endocrinology

## 2019-09-14 NOTE — Telephone Encounter (Signed)
-----   Message from Shan Levans sent at 04/29/2019  1:52 PM EDT ----- Regarding: Prolia Patient scheduled Prolia injection for 11/01/19

## 2019-09-14 NOTE — Telephone Encounter (Signed)
Ok, I ordered to be done in the future

## 2019-09-14 NOTE — Addendum Note (Signed)
Addended by: Renato Shin on: 09/14/2019 09:06 AM   Modules accepted: Orders

## 2019-09-14 NOTE — Telephone Encounter (Signed)
Pt has been submitted to the portal. Pt is already scheduled for an appt in April so just need SOB and auth before appt if needed

## 2019-09-14 NOTE — Telephone Encounter (Signed)
Please advise 

## 2019-09-14 NOTE — Telephone Encounter (Signed)
Called pt to schedule lab appt 1 week before Reclast on 11/28/19. States she is not available to have labs at all that week. Requested to schedule lab appt on 5/24 and is asking that her Reclast be rescheduled to the week following. Advised I will forward to Vaughan Basta so she can reschedule her infusion.

## 2019-09-14 NOTE — Telephone Encounter (Signed)
Since last reclast infusion was on 11/03/18, she was told that we need to schedule this past that date.  She chose 11/28/18.  Appointment scheduled for 9AM on that date.

## 2019-09-14 NOTE — Telephone Encounter (Signed)
FYI

## 2019-09-19 ENCOUNTER — Other Ambulatory Visit: Payer: Self-pay

## 2019-09-20 ENCOUNTER — Encounter: Payer: Self-pay | Admitting: Family Medicine

## 2019-09-20 ENCOUNTER — Ambulatory Visit (INDEPENDENT_AMBULATORY_CARE_PROVIDER_SITE_OTHER): Payer: Medicare PPO | Admitting: Family Medicine

## 2019-09-20 VITALS — BP 142/70 | HR 100 | Temp 97.2°F | Ht 59.25 in | Wt 173.0 lb

## 2019-09-20 DIAGNOSIS — Z0000 Encounter for general adult medical examination without abnormal findings: Secondary | ICD-10-CM

## 2019-09-20 DIAGNOSIS — E039 Hypothyroidism, unspecified: Secondary | ICD-10-CM | POA: Diagnosis not present

## 2019-09-20 LAB — BASIC METABOLIC PANEL
BUN: 19 mg/dL (ref 6–23)
CO2: 28 mEq/L (ref 19–32)
Calcium: 9.2 mg/dL (ref 8.4–10.5)
Chloride: 106 mEq/L (ref 96–112)
Creatinine, Ser: 1.05 mg/dL (ref 0.40–1.20)
GFR: 51.03 mL/min — ABNORMAL LOW (ref >60.00)
Glucose, Bld: 106 mg/dL — ABNORMAL HIGH (ref 70–99)
Potassium: 4.5 mEq/L (ref 3.5–5.1)
Sodium: 142 mEq/L (ref 135–145)

## 2019-09-20 LAB — CBC WITH DIFFERENTIAL/PLATELET
Basophils Absolute: 0.1 10*3/uL (ref 0.0–0.1)
Basophils Relative: 1.4 % (ref 0.0–3.0)
Eosinophils Absolute: 0.1 10*3/uL (ref 0.0–0.7)
Eosinophils Relative: 1.4 % (ref 0.0–5.0)
HCT: 36.7 % (ref 36.0–46.0)
Hemoglobin: 12.2 g/dL (ref 12.0–15.0)
Lymphocytes Relative: 21.4 % (ref 12.0–46.0)
Lymphs Abs: 1.4 10*3/uL (ref 0.7–4.0)
MCHC: 33.3 g/dL (ref 30.0–36.0)
MCV: 93.4 fl (ref 78.0–100.0)
Monocytes Absolute: 0.6 10*3/uL (ref 0.1–1.0)
Monocytes Relative: 8.9 % (ref 3.0–12.0)
Neutro Abs: 4.5 10*3/uL (ref 1.4–7.7)
Neutrophils Relative %: 66.9 % (ref 43.0–77.0)
Platelets: 201 10*3/uL (ref 150.0–400.0)
RBC: 3.93 Mil/uL (ref 3.87–5.11)
RDW: 14.7 % (ref 11.5–15.5)
WBC: 6.7 10*3/uL (ref 4.0–10.5)

## 2019-09-20 LAB — LIPID PANEL
Cholesterol: 207 mg/dL — ABNORMAL HIGH (ref 0–200)
HDL: 57.4 mg/dL (ref >39.00)
NonHDL: 149.7
Total CHOL/HDL Ratio: 4
Triglycerides: 247 mg/dL — ABNORMAL HIGH (ref 0.0–149.0)
VLDL: 49.4 mg/dL — ABNORMAL HIGH (ref 0.0–40.0)

## 2019-09-20 LAB — HEPATIC FUNCTION PANEL
ALT: 18 U/L (ref 0–35)
AST: 15 U/L (ref 0–37)
Albumin: 4 g/dL (ref 3.5–5.2)
Alkaline Phosphatase: 53 U/L (ref 39–117)
Bilirubin, Direct: 0.1 mg/dL (ref 0.0–0.3)
Total Bilirubin: 0.5 mg/dL (ref 0.2–1.2)
Total Protein: 6.4 g/dL (ref 6.0–8.3)

## 2019-09-20 LAB — TSH: TSH: 2.38 u[IU]/mL (ref 0.35–4.50)

## 2019-09-20 LAB — T3, FREE: T3, Free: 3 pg/mL (ref 2.3–4.2)

## 2019-09-20 LAB — LDL CHOLESTEROL, DIRECT: Direct LDL: 101 mg/dL

## 2019-09-20 LAB — T4, FREE: Free T4: 0.82 ng/dL (ref 0.60–1.60)

## 2019-09-20 MED ORDER — DICLOFENAC SODIUM 75 MG PO TBEC: 75.0000 mg | DELAYED_RELEASE_TABLET | Freq: Two times a day (BID) | ORAL | 3 refills | Status: DC

## 2019-09-20 MED ORDER — LEVOTHYROXINE SODIUM 50 MCG PO TABS: 50.0000 ug | ORAL_TABLET | Freq: Every day | ORAL | 3 refills | Status: DC

## 2019-09-20 NOTE — Telephone Encounter (Signed)
Appointment rescheduled for 6/1.  9AM

## 2019-09-20 NOTE — Progress Notes (Signed)
   Subjective:    Patient ID: Miranda Mcguire, female    DOB: 14-Jul-1945, 74 y.o.   MRN: [id number]  HPI Here for a well exam. She is doing fine except for diffuse joint pains. We spoke about this a year ago and I prescribed Diclofenac, but she never got this filled. Now she wants to try it.    Review of Systems  Constitutional: Negative.   HENT: Negative.   Eyes: Negative.   Respiratory: Negative.   Cardiovascular: Negative.   Gastrointestinal: Negative.   Genitourinary: Negative for decreased urine volume, difficulty urinating, dyspareunia, dysuria, enuresis, flank pain, frequency, hematuria, pelvic pain and urgency.  Musculoskeletal: Positive for arthralgias.  Skin: Negative.   Neurological: Negative.   Psychiatric/Behavioral: Negative.        Objective:   Physical Exam Constitutional:      General: She is not in acute distress.    Appearance: She is well-developed.  HENT:     Head: Normocephalic and atraumatic.     Right Ear: External ear normal.     Left Ear: External ear normal.     Nose: Nose normal.     Mouth/Throat:     Pharynx: No oropharyngeal exudate.  Eyes:     General: No scleral icterus.    Conjunctiva/sclera: Conjunctivae normal.     Pupils: Pupils are equal, round, and reactive to light.  Neck:     Thyroid: No thyromegaly.     Vascular: No JVD.  Cardiovascular:     Rate and Rhythm: Normal rate and regular rhythm.     Heart sounds: Normal heart sounds. No murmur. No friction rub. No gallop.   Pulmonary:     Effort: Pulmonary effort is normal. No respiratory distress.     Breath sounds: Normal breath sounds. No wheezing or rales.  Chest:     Chest wall: No tenderness.  Abdominal:     General: Bowel sounds are normal. There is no distension.     Palpations: Abdomen is soft. There is no mass.     Tenderness: There is no abdominal tenderness. There is no guarding or rebound.  Musculoskeletal:        General: No tenderness. Normal range of  motion.     Cervical back: Normal range of motion and neck supple.  Lymphadenopathy:     Cervical: No cervical adenopathy.  Skin:    General: Skin is warm and dry.     Findings: No erythema or rash.  Neurological:     Mental Status: She is alert and oriented to person, place, and time.     Cranial Nerves: No cranial nerve deficit.     Motor: No abnormal muscle tone.     Coordination: Coordination normal.     Deep Tendon Reflexes: Reflexes are normal and symmetric. Reflexes normal.  Psychiatric:        Behavior: Behavior normal.        Thought Content: Thought content normal.        Judgment: Judgment normal.           Assessment & Plan:  Well exam. We discussed diet and exercise. Get fasting labs. For the joint pains, she will try diclofenac prn. Alysia Penna, MD

## 2019-10-03 ENCOUNTER — Telehealth: Payer: Self-pay | Admitting: Family Medicine

## 2019-10-03 DIAGNOSIS — E039 Hypothyroidism, unspecified: Secondary | ICD-10-CM

## 2019-10-03 DIAGNOSIS — E782 Mixed hyperlipidemia: Secondary | ICD-10-CM

## 2019-10-03 NOTE — Chronic Care Management (AMB) (Signed)
  Chronic Care Management   Note  10/03/2019 Name: Miranda Mcguire MRN: [id number] DOB: 1946/01/22  Miranda Mcguire is a 74 y.o. year old female who is a primary care patient of Laurey Morale, MD. I reached out to Beverley Fiedler by phone today in response to a referral sent by [name]'s PCP, Laurey Morale, MD.   [name] was given information about Chronic Care Management services today including:  1. CCM service includes personalized support from designated clinical staff supervised by her physician, including individualized plan of care and coordination with other care providers 2. 24/7 contact phone numbers for assistance for urgent and routine care needs. 3. Service will only be billed when office clinical staff spend 20 minutes or more in a month to coordinate care. 4. Only one practitioner may furnish and bill the service in a calendar month. 5. The patient may stop CCM services at any time (effective at the end of the month) by phone call to the office staff.   Patient agreed to services and verbal consent obtained.   Follow up plan:   [name] [employer] Scheduler

## 2019-10-05 DIAGNOSIS — M25561 Pain in right knee: Secondary | ICD-10-CM | POA: Diagnosis not present

## 2019-10-05 NOTE — Telephone Encounter (Signed)
SOB has returned PA is needed Miranda Mcguire is complete and Miranda Mcguire is extended until 07/06/20 Auth # is [id number]

## 2019-10-06 DIAGNOSIS — C9201 Acute myeloblastic leukemia, in remission: Secondary | ICD-10-CM | POA: Diagnosis not present

## 2019-10-06 DIAGNOSIS — Z9481 Bone marrow transplant status: Secondary | ICD-10-CM | POA: Diagnosis not present

## 2019-10-14 DIAGNOSIS — H40013 Open angle with borderline findings, low risk, bilateral: Secondary | ICD-10-CM | POA: Diagnosis not present

## 2019-10-21 NOTE — Addendum Note (Signed)
Addended by: Alysia Penna A on: 10/21/2019 07:46 AM   Modules accepted: Orders

## 2019-10-26 ENCOUNTER — Other Ambulatory Visit: Payer: Self-pay

## 2019-10-26 ENCOUNTER — Ambulatory Visit: Payer: Medicare PPO

## 2019-10-26 DIAGNOSIS — E782 Mixed hyperlipidemia: Secondary | ICD-10-CM

## 2019-10-26 DIAGNOSIS — E039 Hypothyroidism, unspecified: Secondary | ICD-10-CM

## 2019-10-26 DIAGNOSIS — M810 Age-related osteoporosis without current pathological fracture: Secondary | ICD-10-CM

## 2019-10-26 DIAGNOSIS — M159 Polyosteoarthritis, unspecified: Secondary | ICD-10-CM

## 2019-10-26 DIAGNOSIS — I10 Essential (primary) hypertension: Secondary | ICD-10-CM

## 2019-10-26 DIAGNOSIS — J309 Allergic rhinitis, unspecified: Secondary | ICD-10-CM

## 2019-10-26 NOTE — Chronic Care Management (AMB) (Signed)
Chronic Care Management Pharmacy  Name: Miranda Mcguire  MRN: [id number] DOB: 03-19-1946    Initial Questions: 1. Have you seen any other providers since your last visit? NA 2. Any changes in your medicines or health? No   Chief Complaint/ HPI  Miranda Mcguire,  74 y.o. , female presents for their Initial CCM visit with the clinical pharmacist via telephone due to COVID-19 Pandemic.  PCP : Laurey Morale, MD  Their chronic conditions include: HTN, HLD, Hypothyroidism, Osteoporosis, Osteoarthritis, Allergic rhinitis  Office Visits: 09/20/2019- Office visit- Patient presented to Dr. Alysia Penna, MD for annual exam. Patient reported diffuse joint pain. Patient prescribed diclofenac as needed. Patient to obtain fasting labs.   07/26/2019- Office visit- Patient presented to Dr. Carolann Littler, MD for rash. Plan: patient to start Valtrex 1 gram three times daily for 7 days for shingles. Patient to follow up as needed.   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 Patient to obtain repeat chimerism. 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 Patient to return in 08/2020.   03/03/2019- Endocrinology- Patient presented to Dr. Renato Shin, MD for osteoporosis follow up.  Patient has been on Prolia and Reclast since 2019. Patient to obtain repeat bone density and labs. Patient to follow up in 1 year.   Medications: Outpatient Encounter Medications as of 10/26/2019  Medication Sig Note  . amLODipine (NORVASC) 10 MG tablet Take 1 tablet (10 mg total) by mouth daily.   Marland Kitchen atorvastatin (LIPITOR) 20 MG tablet Take 1 tablet (20 mg total) by mouth daily.   Marland Kitchen BIOTIN 5000 PO Take 1 tablet by mouth daily.   . calcitRIOL (ROCALTROL) 0.25 MCG capsule TAKE ONE CAPSULE BY MOUTH THREE TIMES PER WEEK   . Calcium Carbonate-Vit D-Min (CALCIUM 1200 PO) Take 1 capsule by mouth 2 (two) times a  week.    . cholecalciferol (VITAMIN D3) 25 MCG (1000 UNIT) tablet Take 1,000 Units by mouth daily.    . diclofenac (VOLTAREN) 75 MG EC tablet Take 1 tablet (75 mg total) by mouth 2 (two) times daily.   Marland Kitchen levothyroxine (SYNTHROID) 50 MCG tablet Take 1 tablet (50 mcg total) by mouth daily.   Marland Kitchen lidocaine-prilocaine (EMLA) cream Apply 1 application topically as needed.   . loratadine (CLARITIN) 10 MG tablet Take 10 mg by mouth daily. allertec 01/24/2015: Received from: [facility] ([facility]) Received Sig: Take 10 mg by mouth.  . Magnesium Oxide 250 MG TABS Take 1 tablet by mouth daily.    . metoprolol tartrate (LOPRESSOR) 25 MG tablet TAKE ONE TABLET BY MOUTH TWICE A DAY WITH FOOD   . Multiple Vitamin (MULTIVITAMIN) tablet Take 1 tablet by mouth daily.   . Multiple Vitamins-Minerals (MACULAR VITAMIN BENEFIT PO) Take 1 tablet by mouth daily.   . Potassium Gluconate 550 (90 K) MG TABS Take 1 tablet by mouth daily.    . zoledronic acid (RECLAST) 5 MG/100ML SOLN injection Reclast   . denosumab (PROLIA) 60 MG/ML SOSY injection Prolia   . valACYclovir (VALTREX) 1000 MG tablet TAKE ONE TABLET BY MOUTH THREE TIMES A DAY (Patient not taking: Reported on 09/20/2019)    Facility-Administered Encounter Medications as of 10/26/2019  Medication  . denosumab (PROLIA) injection 60 mg    Current Diagnosis/Assessment:  Goals Addressed            This Visit's Progress   . Pharmacy Care Plan  CARE PLAN ENTRY  Current Barriers:  . Chronic Disease Management support, education, and care coordination needs related to Hypertension, Hyperlipidemia, and Hypothyroidism, Osteoporosis, Osteoarthritis, Allergic rhinitis  Hypertension . Current antihypertensive regimen:  o amlodipine 10mg , 1 tablt once daily  o metoprolol tartrate 25mg , 1 tablet twice daily  . Previous antihypertensives tried: none . Last practice recorded BP readings:  BP Readings from Last 3 Encounters:  09/20/19 (!) 142/70    07/26/19 124/68  02/28/19 124/60 .  Current home BP readings: does not check at home  Hyperlipidemia . Current antihyperlipidemic regimen: atorvastatin 20mg , 1 tablet once daily  . Previous antihyperlipidemic medications tried none . Most recent lipid panel:     Component Value Date/Time   CHOL 207 (H) 09/20/2019 0931   TRIG 247.0 (H) 09/20/2019 0931   HDL 57.40 09/20/2019 0931   CHOLHDL 4 09/20/2019 0931   VLDL 49.4 (H) 09/20/2019 0931   LDLCALC 116 (H) 02/07/2019 0826   LDLDIRECT 101.0 09/20/2019 0931 .  The 10-year ASCVD risk score Mikey Bussing DC Jr., et al., 2013) is: 22.6%  Hypothyroidism . Current regimen: levothyroxine 26mcg, 1 tablet once daily  TSH  Date Value Ref Range Status  09/20/2019 2.38 0.35 - 4.50 uIU/mL Final   Osteoporosis . Current regimen:  o Prolia 60mg  injection every 6 months o Reclast IV, every year o calcitriol 0.108mcg, 1 tablet 3 days per week o vitamin D, 1070mcg, 1 tablet daily  o calcium 1200mg  1 tablet twice daily   Allergic rhinitis . Current regimen:  loratadine 10mg , 1 tablet once daily   Osteoarthritis . Current regimen: diclofenac 75mg , 1 tablet twice daily    Pharmacist Clinical Goal(s):  Marland Kitchen Hypertension o Over the next 180 days, patient will maintain blood pressure <140/90 mmHg. Marland Kitchen Hyperlipidemia o Over the next 180 days, patient will lower ASCVD risk by continuing statin medication and lifestyle modifications (diet/ exercise).  . Hypothyroidism o Over the next 180 days, patient will maintain TSH between 0.45 to 4.5uIU/ml. . Osteoporosis o Over the next 180 days, patient will minimize risk of fractures by continuing appointments for Reclast infusions and Prolia injections. . Osteoarthritis o Over the next 90 days, Patient will minimize pain levels by continuing current regimen and obtain steroid injections every 3 months as needed.  Interventions: . Comprehensive medication review performed. . Hypertension o Recommend checking  blood pressure 1 to 2 times per week.  o Discussed diet modifications. DASH diet:  following a diet emphasizing fruits and vegetables and low-fat dairy products along with whole grains, fish, poultry, and nuts. Reducing red meats and sugars.  . Hyperlipidemia o We discussed how a diet high in plant sterols (fruits/vegetables/nuts/whole grains/legumes) may reduce your cholesterol.  Encouraged increasing fiber to a daily intake of 10-25g/day   Patient Self Care Activities:  . Patient verbalizes understanding of plan as described above, Self administers medications as prescribed, Calls pharmacy for medication refills, and Calls provider office for new concerns or questions . Over next 180 days, patient will: . Continue current medications as directed by providers.  . Continue following up with primary care provider and/or specialists.  Initial goal documentation       SDOH Interventions     Most Recent Value  SDOH Interventions  SDOH Interventions for the Following Domains  Financial Strain  Financial Strain Interventions  Other (Comment) [None needed.]      Hypertension   Office blood pressures are  BP Readings from Last 3 Encounters:  09/20/19 (!) 142/70  07/26/19 124/68  02/28/19 124/60   Patient has failed these meds in the past: none   Patient checks BP at home patient does not check at home   Patient is controlled on:  - amlodipine 10mg , 1 tablt once daily  - metoprolol tartrate 25mg , 1 tablet twice daily   We discussed diet and exercise extensively  . Discussed diet modifications. DASH diet:  following a diet emphasizing fruits and vegetables and low-fat dairy products along with whole grains, fish, poultry, and nuts. Reducing red meats and sugars.   . Exercising - Patient reports going on walks; further exercise is limited. . Reducing the amount of salt intake to 1500mg /per day.  . Recommend using a salt substitute to replace your salt if you need flavor.    - Patient reports not cooking with salt and does not add after meals are cooked.   Plan Continue current medications and control with diet and exercise   Hyperlipidemia   Lipid Panel     Component Value Date/Time   CHOL 207 (H) 09/20/2019 0931   TRIG 247.0 (H) 09/20/2019 0931   HDL 57.40 09/20/2019 0931   CHOLHDL 4 09/20/2019 0931   VLDL 49.4 (H) 09/20/2019 0931   LDLCALC 116 (H) 02/07/2019 0826   LDLDIRECT 101.0 09/20/2019 0931    The 10-year ASCVD risk score Mikey Bussing DC Jr., et al., 2013) is: 22.6%   Values used to calculate the score:     Age: 8 years     Sex: Female     Is Non-Hispanic African American: No     Diabetic: No     Tobacco smoker: No     Systolic Blood Pressure: [id number] mmHg     Is BP treated: Yes     HDL Cholesterol: 57.4 mg/dL     Total Cholesterol: 207 mg/dL   Patient has failed these meds in past: none  Patient is currently controlled on the following medications:   atorvastatin 20mg , 1 tablet once daily   We discussed:  diet and exercise extensively  . How to reduce cholesterol through diet/weight management and physical activity.    . We discussed how a diet high in plant sterols (fruits/vegetables/nuts/whole grains/legumes) may reduce your cholesterol.  Encouraged increasing fiber to a daily intake of 10-25g/day   Plan Continue current medications and control with diet and exercise  Hypothyroidism   TSH  Date Value Ref Range Status  09/20/2019 2.38 0.35 - 4.50 uIU/mL Final   Patient has failed these meds in past: none  Patient is currently controlled on the following medications:  - levothyroxine 51mcg, 1 tablet once daily   Plan Continue current medications.   Allergic rhinitis   Patient has failed these meds in past: none  Patient is currently controlled on the following medications: - loratadine 10mg , 1 tablet once daily   Plan Continue current medications.   Osteoporosis  Patient has failed these meds in past: Fosamax   Patient  is currently managed on the following medications:  - Prolia 60mg  injection every 6 months - Reclast IV, every year - calcitriol 0.39mcg, 1 tablet 3x/ week - vitamin D, 1034mcg, 1 tablet daily  - calcium 1200mg  1 tablet 2x daily   BMD  03/02/2019  Lumbar spine L1-L4 (L2) Femoral neck (FN)  T-score -1.0 RFN: -2.3 LFN: -2.4  Change in BMD from previous DXA test (%) Up 2.1* Down 4.4*  (*) statistically significant  02/25/2018 Lumbar spine L1-L4 Femoral neck (FN) 33% distal radius  T-score -1.6 RFN: -2.1 LFN: -2.1  n/a  Change in BMD from previous DXA test (%) Up 3.1% Down 0.5% n/a  (*) statistically significant   02/16/2017 Lumbar spine L1-L4 (L2) Femoral neck (FN)  T-score -1.9 RFN: -2.0 LFN: -2.0  Change in BMD from previous DXA test (%) -12.1%* -8.1%*  (*) statistically significant  Plan Managed by Dr. Loanne Drilling (endocrinology). Next follow up 02/28/20.  Patient scheduled to receive Prolia on 4/27 and Reclast on 6/01 (combination has been clarified multiple times through patient messages).  Continue current medications  Osteoarthritis   Patient has failed these meds in past: nabumetone  Patient is currently controlled on the following medications:  - diclofenac 75mg , 1 tablet twice daily  - patient states she obtains steroid injections in knees and hands with [facility] (every 3 months if needed)    Plan Continue current medications   OTC/ supplements   Patient is currently on the following medications:   magnesium 250mg , 1 tablet daily   potassium gluconate 550, 1 tablet daily   multivitamin, 1 tablet daily   biotin 5000 mcg, 1 tablet daily   macula supplement, 1 tablet daily   lidocaine- prilocaine cream, apply as needed     Medication Management  Patient organizes medications: patient does not use pill box; patient has pill bottles in baskets. She states she is used to medications and has a routine to take them.  Adherence: no gaps in refill history (from  medication dispense history from 04/29/2019 to 10/26/2019)    Follow up Follow up visit with PharmD in 6 months Will conduct general telephone calls for periodic check-ins before next visit.    Anson Crofts, PharmD Clinical Pharmacist [facility] [place] [phone]

## 2019-11-01 ENCOUNTER — Other Ambulatory Visit: Payer: Self-pay

## 2019-11-01 ENCOUNTER — Ambulatory Visit (INDEPENDENT_AMBULATORY_CARE_PROVIDER_SITE_OTHER): Payer: Medicare PPO

## 2019-11-01 DIAGNOSIS — M810 Age-related osteoporosis without current pathological fracture: Secondary | ICD-10-CM

## 2019-11-01 MED ORDER — DENOSUMAB 60 MG/ML ~~LOC~~ SOSY
60.0000 mg | PREFILLED_SYRINGE | Freq: Once | SUBCUTANEOUS | Status: AC
  Administered 2019-11-01: 60 mg via SUBCUTANEOUS

## 2019-11-01 NOTE — Progress Notes (Signed)
Per orders of Dr. Loanne Drilling injection of Prolia 60mg  given today by N.Aubree Doody. Patient tolerated injection well.

## 2019-11-03 NOTE — Patient Instructions (Addendum)
Visit Information  Goals Addressed            This Visit's Progress   . Pharmacy Care Plan       CARE PLAN ENTRY  Current Barriers:  . Chronic Disease Management support, education, and care coordination needs related to Hypertension, Hyperlipidemia, and Hypothyroidism, Osteoporosis, Osteoarthritis, Allergic rhinitis  Hypertension . Current antihypertensive regimen:  o amlodipine 10mg , 1 tablt once daily  o metoprolol tartrate 25mg , 1 tablet twice daily  . Previous antihypertensives tried: none . Last practice recorded BP readings:  BP Readings from Last 3 Encounters:  09/20/19 (!) 142/70  07/26/19 124/68  02/28/19 124/60 .  Current home BP readings: does not check at home  Hyperlipidemia . Current antihyperlipidemic regimen: atorvastatin 20mg , 1 tablet once daily  . Previous antihyperlipidemic medications tried none . Most recent lipid panel:     Component Value Date/Time   CHOL 207 (H) 09/20/2019 0931   TRIG 247.0 (H) 09/20/2019 0931   HDL 57.40 09/20/2019 0931   CHOLHDL 4 09/20/2019 0931   VLDL 49.4 (H) 09/20/2019 0931   LDLCALC 116 (H) 02/07/2019 0826   LDLDIRECT 101.0 09/20/2019 0931 .  The 10-year ASCVD risk score Mikey Bussing DC Jr., et al., 2013) is: 22.6%  Hypothyroidism . Current regimen: levothyroxine 29mcg, 1 tablet once daily  TSH  Date Value Ref Range Status  09/20/2019 2.38 0.35 - 4.50 uIU/mL Final   Osteoporosis . Current regimen:  o Prolia 60mg  injection every 6 months o Reclast IV, every year o calcitriol 0.68mcg, 1 tablet 3 days per week o vitamin D, 1056mcg, 1 tablet daily  o calcium 1200mg  1 tablet twice daily   Allergic rhinitis . Current regimen:  loratadine 10mg , 1 tablet once daily   Osteoarthritis . Current regimen: diclofenac 75mg , 1 tablet twice daily    Pharmacist Clinical Goal(s):  Marland Kitchen Hypertension o Over the next 180 days, patient will maintain blood pressure <140/90 mmHg. Marland Kitchen Hyperlipidemia o Over the next 180 days, patient will  lower ASCVD risk by continuing statin medication and lifestyle modifications (diet/ exercise).  . Hypothyroidism o Over the next 180 days, patient will maintain TSH between 0.45 to 4.5uIU/ml. . Osteoporosis o Over the next 180 days, patient will minimize risk of fractures by continuing appointments for Reclast infusions and Prolia injections. . Osteoarthritis o Over the next 90 days, Patient will minimize pain levels by continuing current regimen and obtain steroid injections every 3 months as needed.  Interventions: . Comprehensive medication review performed. . Hypertension o Recommend checking blood pressure 1 to 2 times per week.  o Discussed diet modifications. DASH diet:  following a diet emphasizing fruits and vegetables and low-fat dairy products along with whole grains, fish, poultry, and nuts. Reducing red meats and sugars.  . Hyperlipidemia o We discussed how a diet high in plant sterols (fruits/vegetables/nuts/whole grains/legumes) may reduce your cholesterol.  Encouraged increasing fiber to a daily intake of 10-25g/day   Patient Self Care Activities:  . Patient verbalizes understanding of plan as described above, Self administers medications as prescribed, Calls pharmacy for medication refills, and Calls provider office for new concerns or questions . Over next 180 days, patient will: . Continue current medications as directed by providers.  . Continue following up with primary care provider and/or specialists.  Initial goal documentation        Miranda Mcguire was given information about Chronic Care Management services today including:  1. CCM service includes personalized support from designated clinical staff supervised by her physician,  including individualized plan of care and coordination with other care providers 2. 24/7 contact phone numbers for assistance for urgent and routine care needs. 3. Standard insurance, coinsurance, copays and deductibles apply for chronic  care management only during months in which we provide at least 20 minutes of these services. Most insurances cover these services at 100%, however patients may be responsible for any copay, coinsurance and/or deductible if applicable. This service may help you avoid the need for more expensive face-to-face services. 4. Only one practitioner may furnish and bill the service in a calendar month. 5. The patient may stop CCM services at any time (effective at the end of the month) by phone call to the office staff.  Patient agreed to services and verbal consent obtained.   The patient verbalized understanding of instructions provided today and agreed to receive a mailed copy of patient instruction and/or educational materials. Telephone follow up appointment with pharmacy team member scheduled for: 04/26/2020  Anson Crofts, PharmD Clinical Pharmacist [facility] [phone]       [place] stands for "Dietary Approaches to Stop Hypertension." The DASH eating plan is a healthy eating plan that has been shown to reduce high blood pressure (hypertension). It may also reduce your risk for type 2 diabetes, heart disease, and stroke. The DASH eating plan may also help with weight loss. What are tips for following this plan?  General guidelines  Avoid eating more than 2,300 mg (milligrams) of salt (sodium) a day. If you have hypertension, you may need to reduce your sodium intake to 1,500 mg a day.  Limit alcohol intake to no more than 1 drink a day for nonpregnant women and 2 drinks a day for men. One drink equals 12 oz of beer, 5 oz of wine, or 1 oz of hard liquor.  Work with your health care provider to maintain a healthy body weight or to lose weight. Ask what an ideal weight is for you.  Get at least 30 minutes of exercise that causes your heart to beat faster (aerobic exercise) most days of the week. Activities may include walking, swimming, or  biking.  Work with your health care provider or diet and nutrition specialist (dietitian) to adjust your eating plan to your individual calorie needs. Reading food labels   Check food labels for the amount of sodium per serving. Choose foods with less than 5 percent of the Daily Value of sodium. Generally, foods with less than 300 mg of sodium per serving fit into this eating plan.  To find whole grains, look for the word "whole" as the first word in the ingredient list. Shopping  Buy products labeled as "low-sodium" or "no salt added."  Buy fresh foods. Avoid canned foods and premade or frozen meals. Cooking  Avoid adding salt when cooking. Use salt-free seasonings or herbs instead of table salt or sea salt. Check with your health care provider or pharmacist before using salt substitutes.  Do not fry foods. Cook foods using healthy methods such as baking, boiling, grilling, and broiling instead.  Cook with heart-healthy oils, such as olive, canola, soybean, or sunflower oil. Meal planning  Eat a balanced diet that includes: ? 5 or more servings of fruits and vegetables each day. At each meal, try to fill half of your plate with fruits and vegetables. ? Up to 6-8 servings of whole grains each day. ? Less than 6 oz of lean meat, poultry, or fish each day.  A 3-oz serving of meat is about the same size as a deck of cards. One egg equals 1 oz. ? 2 servings of low-fat dairy each day. ? A serving of nuts, seeds, or beans 5 times each week. ? Heart-healthy fats. Healthy fats called Omega-3 fatty acids are found in foods such as flaxseeds and coldwater fish, like sardines, salmon, and mackerel.  Limit how much you eat of the following: ? Canned or prepackaged foods. ? Food that is high in trans fat, such as fried foods. ? Food that is high in saturated fat, such as fatty meat. ? Sweets, desserts, sugary drinks, and other foods with added sugar. ? Full-fat dairy products.  Do not salt  foods before eating.  Try to eat at least 2 vegetarian meals each week.  Eat more home-cooked food and less restaurant, buffet, and fast food.  When eating at a restaurant, ask that your food be prepared with less salt or no salt, if possible. What foods are recommended? The items listed may not be a complete list. Talk with your dietitian about what dietary choices are best for you. Grains Whole-grain or whole-wheat bread. Whole-grain or whole-wheat pasta. Brown rice. [place]row. Bulgur. Whole-grain and low-sodium cereals. Pita bread. Low-fat, low-sodium crackers. Whole-wheat flour tortillas. Vegetables Fresh or frozen vegetables (raw, steamed, roasted, or grilled). Low-sodium or reduced-sodium tomato and vegetable juice. Low-sodium or reduced-sodium tomato sauce and tomato paste. Low-sodium or reduced-sodium canned vegetables. Fruits All fresh, dried, or frozen fruit. Canned fruit in natural juice (without added sugar). Meat and other protein foods Skinless chicken or [place]. Ground chicken or [place]. Pork with fat trimmed off. Fish and seafood. Egg whites. Dried beans, peas, or lentils. Unsalted nuts, nut butters, and seeds. Unsalted canned beans. Lean cuts of beef with fat trimmed off. Low-sodium, lean deli meat. Dairy Low-fat (1%) or fat-free (skim) milk. Fat-free, low-fat, or reduced-fat cheeses. Nonfat, low-sodium ricotta or cottage cheese. Low-fat or nonfat yogurt. Low-fat, low-sodium cheese. Fats and oils Soft margarine without trans fats. Vegetable oil. Low-fat, reduced-fat, or light mayonnaise and salad dressings (reduced-sodium). Canola, safflower, olive, soybean, and sunflower oils. Avocado. Seasoning and other foods Herbs. Spices. Seasoning mixes without salt. Unsalted popcorn and pretzels. Fat-free sweets. What foods are not recommended? The items listed may not be a complete list. Talk with your dietitian about what dietary choices are best for you. Grains Baked goods  made with fat, such as croissants, muffins, or some breads. Dry pasta or rice meal packs. Vegetables Creamed or fried vegetables. Vegetables in a cheese sauce. Regular canned vegetables (not low-sodium or reduced-sodium). Regular canned tomato sauce and paste (not low-sodium or reduced-sodium). Regular tomato and vegetable juice (not low-sodium or reduced-sodium). Angie Fava. Olives. Fruits Canned fruit in a light or heavy syrup. Fried fruit. Fruit in cream or butter sauce. Meat and other protein foods Fatty cuts of meat. Ribs. Fried meat. Berniece Salines. Sausage. Bologna and other processed lunch meats. Salami. Fatback. Hotdogs. Bratwurst. Salted nuts and seeds. Canned beans with added salt. Canned or smoked fish. Whole eggs or egg yolks. Chicken or [place] with skin. Dairy Whole or 2% milk, cream, and half-and-half. Whole or full-fat cream cheese. Whole-fat or sweetened yogurt. Full-fat cheese. Nondairy creamers. Whipped toppings. Processed cheese and cheese spreads. Fats and oils Butter. Stick margarine. Lard. Shortening. Ghee. Bacon fat. Tropical oils, such as coconut, palm kernel, or palm oil. Seasoning and other foods Salted popcorn and pretzels. Onion salt, garlic salt, seasoned salt, table salt, and sea salt. Worcestershire sauce. Tartar  sauce. Barbecue sauce. Teriyaki sauce. Soy sauce, including reduced-sodium. Steak sauce. Canned and packaged gravies. Fish sauce. Oyster sauce. Cocktail sauce. Horseradish that you find on the shelf. Ketchup. Mustard. Meat flavorings and tenderizers. Bouillon cubes. Hot sauce and Tabasco sauce. Premade or packaged marinades. Premade or packaged taco seasonings. Relishes. Regular salad dressings. Where to find more information:  National Heart, Lung, and [place]: https://wilson-eaton.com/  American Heart Association: www.heart.org Summary  The DASH eating plan is a healthy eating plan that has been shown to reduce high blood pressure (hypertension). It may also reduce  your risk for type 2 diabetes, heart disease, and stroke.  With the DASH eating plan, you should limit salt (sodium) intake to 2,300 mg a day. If you have hypertension, you may need to reduce your sodium intake to 1,500 mg a day.  When on the DASH eating plan, aim to eat more fresh fruits and vegetables, whole grains, lean proteins, low-fat dairy, and heart-healthy fats.  Work with your health care provider or diet and nutrition specialist (dietitian) to adjust your eating plan to your individual calorie needs. This information is not intended to replace advice given to you by your health care provider. Make sure you discuss any questions you have with your health care provider. Document Revised: 06/05/2017 Document Reviewed: 06/16/2016 Elsevier Patient Education  2020 [employer].

## 2019-11-10 DIAGNOSIS — C9201 Acute myeloblastic leukemia, in remission: Secondary | ICD-10-CM | POA: Diagnosis not present

## 2019-11-10 DIAGNOSIS — Z9481 Bone marrow transplant status: Secondary | ICD-10-CM | POA: Diagnosis not present

## 2019-11-28 ENCOUNTER — Other Ambulatory Visit: Payer: Self-pay

## 2019-11-28 ENCOUNTER — Other Ambulatory Visit (INDEPENDENT_AMBULATORY_CARE_PROVIDER_SITE_OTHER): Payer: Medicare PPO

## 2019-11-28 ENCOUNTER — Encounter: Payer: Self-pay | Admitting: Family Medicine

## 2019-11-28 ENCOUNTER — Ambulatory Visit: Payer: Medicare PPO

## 2019-11-28 DIAGNOSIS — M810 Age-related osteoporosis without current pathological fracture: Secondary | ICD-10-CM

## 2019-11-28 LAB — BASIC METABOLIC PANEL
BUN: 27 mg/dL — ABNORMAL HIGH (ref 6–23)
CO2: 27 mEq/L (ref 19–32)
Calcium: 9.2 mg/dL (ref 8.4–10.5)
Chloride: 107 mEq/L (ref 96–112)
Creatinine, Ser: 1.21 mg/dL — ABNORMAL HIGH (ref 0.40–1.20)
GFR: 43.31 mL/min — ABNORMAL LOW (ref >60.00)
Glucose, Bld: 130 mg/dL — ABNORMAL HIGH (ref 70–99)
Potassium: 4.5 mEq/L (ref 3.5–5.1)
Sodium: 141 mEq/L (ref 135–145)

## 2019-11-30 ENCOUNTER — Encounter: Payer: Self-pay | Admitting: Endocrinology

## 2019-11-30 ENCOUNTER — Telehealth: Payer: Self-pay

## 2019-11-30 NOTE — Telephone Encounter (Signed)
Called pt's insurance company to verify coverage for Reclast.  Representative stated that the patient has a max out of pocket expense of $1,000, and zero of this has been met.  However, due to this medication and process being billed through Part B, the patient has a $0.00 copay and the procedure codes of C3754,H6067, and 96365 are covered at 100% and no PA is needed.   Reference # is [id number]  Will call pt to schedule.

## 2019-12-01 NOTE — Telephone Encounter (Signed)
Pt had BMP on 11/28/19 in preperation for Reclast infusion. Reclast is now scheduled at 0900 on 12/12/19. Will the BMP done on 11/28/19 be sufficient on 12/12/19?

## 2019-12-01 NOTE — Telephone Encounter (Signed)
Yes, that is fine. 

## 2019-12-06 ENCOUNTER — Ambulatory Visit: Payer: Medicare PPO

## 2019-12-07 ENCOUNTER — Other Ambulatory Visit: Payer: Self-pay | Admitting: Endocrinology

## 2019-12-07 NOTE — Telephone Encounter (Signed)
Her kidney function has been a little slow but stable for there past few years. No need for special treatment at this time. We will keep an eye on it

## 2019-12-08 DIAGNOSIS — C9201 Acute myeloblastic leukemia, in remission: Secondary | ICD-10-CM | POA: Diagnosis not present

## 2019-12-08 DIAGNOSIS — R7989 Other specified abnormal findings of blood chemistry: Secondary | ICD-10-CM | POA: Diagnosis not present

## 2019-12-08 DIAGNOSIS — Z9481 Bone marrow transplant status: Secondary | ICD-10-CM | POA: Diagnosis not present

## 2019-12-12 ENCOUNTER — Ambulatory Visit (INDEPENDENT_AMBULATORY_CARE_PROVIDER_SITE_OTHER): Payer: Medicare PPO

## 2019-12-12 ENCOUNTER — Other Ambulatory Visit: Payer: Self-pay

## 2019-12-12 VITALS — BP 120/68 | HR 97 | Temp 98.4°F | Resp 18 | Ht 59.0 in | Wt 177.0 lb

## 2019-12-12 DIAGNOSIS — M810 Age-related osteoporosis without current pathological fracture: Secondary | ICD-10-CM

## 2019-12-12 MED ORDER — ZOLEDRONIC ACID 5 MG/100ML IV SOLN
5.0000 mg | Freq: Once | INTRAVENOUS | Status: AC
  Administered 2019-12-12: 5 mg via INTRAVENOUS

## 2019-12-12 NOTE — Progress Notes (Signed)
0900:  BP 120/68, HR 97, RR  18, SpO2 100% on room air.  Access established with 22G in left ac x 1 attempt.  Reclast 5mg /129ml started. Pt tolerating well.    0940:  Infusion complete.  22G in left ac discontinued, catheter intact.  Pt tolerated well, denies any complaints.  BP 118/64, HR 94, RR 18, SpO2 98% on room air.

## 2019-12-20 ENCOUNTER — Other Ambulatory Visit: Payer: Self-pay | Admitting: Family Medicine

## 2019-12-20 DIAGNOSIS — Z1231 Encounter for screening mammogram for malignant neoplasm of breast: Secondary | ICD-10-CM

## 2020-01-05 DIAGNOSIS — Z9481 Bone marrow transplant status: Secondary | ICD-10-CM | POA: Diagnosis not present

## 2020-02-06 DIAGNOSIS — N182 Chronic kidney disease, stage 2 (mild): Secondary | ICD-10-CM | POA: Diagnosis not present

## 2020-02-06 DIAGNOSIS — C9201 Acute myeloblastic leukemia, in remission: Secondary | ICD-10-CM | POA: Diagnosis not present

## 2020-02-06 DIAGNOSIS — Z9221 Personal history of antineoplastic chemotherapy: Secondary | ICD-10-CM | POA: Diagnosis not present

## 2020-02-06 DIAGNOSIS — T865 Complications of stem cell transplant: Secondary | ICD-10-CM | POA: Diagnosis not present

## 2020-02-06 DIAGNOSIS — Z9222 Personal history of monoclonal drug therapy: Secondary | ICD-10-CM | POA: Diagnosis not present

## 2020-02-06 DIAGNOSIS — N289 Disorder of kidney and ureter, unspecified: Secondary | ICD-10-CM | POA: Diagnosis not present

## 2020-02-06 DIAGNOSIS — Z9481 Bone marrow transplant status: Secondary | ICD-10-CM | POA: Diagnosis not present

## 2020-02-06 DIAGNOSIS — Z79899 Other long term (current) drug therapy: Secondary | ICD-10-CM | POA: Diagnosis not present

## 2020-02-06 DIAGNOSIS — R7989 Other specified abnormal findings of blood chemistry: Secondary | ICD-10-CM | POA: Diagnosis not present

## 2020-02-06 DIAGNOSIS — M810 Age-related osteoporosis without current pathological fracture: Secondary | ICD-10-CM | POA: Diagnosis not present

## 2020-02-08 ENCOUNTER — Ambulatory Visit
Admission: RE | Admit: 2020-02-08 | Discharge: 2020-02-08 | Disposition: A | Discharge status: Home / Self Care | Payer: Medicare PPO | Source: Ambulatory Visit | Attending: Family Medicine | Admitting: Family Medicine

## 2020-02-08 ENCOUNTER — Other Ambulatory Visit: Payer: Self-pay

## 2020-02-08 DIAGNOSIS — Z1231 Encounter for screening mammogram for malignant neoplasm of breast: Secondary | ICD-10-CM

## 2020-02-27 ENCOUNTER — Telehealth: Payer: Self-pay | Admitting: Family Medicine

## 2020-02-27 NOTE — Telephone Encounter (Signed)
Patient declined the AWV this year

## 2020-02-28 ENCOUNTER — Telehealth: Payer: Self-pay

## 2020-02-28 ENCOUNTER — Ambulatory Visit (INDEPENDENT_AMBULATORY_CARE_PROVIDER_SITE_OTHER): Payer: Medicare PPO | Admitting: Endocrinology

## 2020-02-28 ENCOUNTER — Encounter: Payer: Self-pay | Admitting: Endocrinology

## 2020-02-28 ENCOUNTER — Other Ambulatory Visit: Payer: Self-pay

## 2020-02-28 VITALS — BP 140/60 | HR 97 | Ht 59.0 in | Wt 174.8 lb

## 2020-02-28 DIAGNOSIS — M810 Age-related osteoporosis without current pathological fracture: Secondary | ICD-10-CM | POA: Diagnosis not present

## 2020-02-28 MED ORDER — VITAMIN D 25 MCG (1000 UNIT) PO TABS: 3000.0000 [IU] | ORAL_TABLET | Freq: Every day | ORAL | 11 refills | Status: AC

## 2020-02-28 NOTE — Patient Instructions (Addendum)
Please increase the vitamin-D to 3000 units per day. Please continue the same Prolia and Reclast. Please do the bone density soon (must be after 03/03/20).  Please come back for a follow-up appointment in 1 year.

## 2020-02-28 NOTE — Telephone Encounter (Signed)
Pt seen today by Dr. Loanne Drilling for osteoporosis. Last Prolia 11/01/19. Sending this message to Wyatt Haste, LPN to ensure pt is scheduled for next Prolia in Oct, pending insurance auth if any.
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
## 2020-02-28 NOTE — Telephone Encounter (Signed)
Verification is not done until one month or less prior to administration due date.

## 2020-03-06 ENCOUNTER — Ambulatory Visit (INDEPENDENT_AMBULATORY_CARE_PROVIDER_SITE_OTHER)
Admission: RE | Admit: 2020-03-06 | Discharge: 2020-03-06 | Disposition: A | Discharge status: Home / Self Care | Payer: Medicare PPO | Source: Ambulatory Visit | Attending: Endocrinology | Admitting: Endocrinology

## 2020-03-06 ENCOUNTER — Other Ambulatory Visit: Payer: Self-pay

## 2020-03-06 DIAGNOSIS — M810 Age-related osteoporosis without current pathological fracture: Secondary | ICD-10-CM

## 2020-03-09 ENCOUNTER — Telehealth: Payer: Self-pay

## 2020-03-09 NOTE — Telephone Encounter (Signed)
Patient submitted to [place] Portal to verify coverage for Prolia.

## 2020-03-15 NOTE — Telephone Encounter (Signed)
Summary of benefits indicates that patient's insurance does require a PA for Prolia, but it is already on file but set to expire on 07/06/20. Patient does not owe anything, and she can be scheduled anytime on or after 05/03/20.

## 2020-03-21 NOTE — Telephone Encounter (Signed)
Called pt and informed her that is was time to schedule her Prolia injection again, however, insurance would not cover it until on or after 05/03/20. Pt verbalized understanding of this, and she was scheduled for 05/03/20 at 1015. She was also made aware that her copay appears to be $0 based on the summary of benefits provided by [employer]. Pt verbalized understanding of this.

## 2020-03-28 DIAGNOSIS — H35033 Hypertensive retinopathy, bilateral: Secondary | ICD-10-CM | POA: Diagnosis not present

## 2020-03-28 DIAGNOSIS — H353132 Nonexudative age-related macular degeneration, bilateral, intermediate dry stage: Secondary | ICD-10-CM | POA: Diagnosis not present

## 2020-03-28 DIAGNOSIS — H40013 Open angle with borderline findings, low risk, bilateral: Secondary | ICD-10-CM | POA: Diagnosis not present

## 2020-03-28 DIAGNOSIS — H04123 Dry eye syndrome of bilateral lacrimal glands: Secondary | ICD-10-CM | POA: Diagnosis not present

## 2020-04-04 ENCOUNTER — Other Ambulatory Visit: Payer: Self-pay | Admitting: Family Medicine

## 2020-04-23 ENCOUNTER — Other Ambulatory Visit: Payer: Self-pay

## 2020-04-23 ENCOUNTER — Ambulatory Visit (INDEPENDENT_AMBULATORY_CARE_PROVIDER_SITE_OTHER): Payer: Medicare PPO | Admitting: *Deleted

## 2020-04-23 DIAGNOSIS — Z23 Encounter for immunization: Secondary | ICD-10-CM

## 2020-04-25 ENCOUNTER — Telehealth: Payer: Medicare PPO

## 2020-04-26 ENCOUNTER — Ambulatory Visit: Payer: Medicare PPO

## 2020-04-26 DIAGNOSIS — M810 Age-related osteoporosis without current pathological fracture: Secondary | ICD-10-CM

## 2020-04-26 DIAGNOSIS — I10 Essential (primary) hypertension: Secondary | ICD-10-CM

## 2020-04-26 NOTE — Chronic Care Management (AMB) (Signed)
Chronic Care Management Pharmacy  Name: Miranda Mcguire  MRN: [id number] DOB: 1945/07/09    Initial Questions: 1. Have you seen any other providers since your last visit? NA 2. Any changes in your medicines or health? No   Chief Complaint/ HPI  Miranda Mcguire,  74 y.o. , female presents for their Follow-Up CCM visit with the clinical pharmacist via telephone due to COVID-19 Pandemic.  PCP : Laurey Morale, MD  Their chronic conditions include: HTN, HLD, Hypothyroidism, Osteoporosis, Osteoarthritis, Allergic rhinitis  Office Visits: 09/20/2019- Office visit- Patient presented to Dr. Alysia Penna, MD for annual exam. Patient reported diffuse joint pain. Patient prescribed diclofenac as needed. Patient to obtain fasting labs.   07/26/2019- Office visit- Patient presented to Dr. Carolann Littler, MD for rash. Plan: patient to start Valtrex 1 gram three times daily for 7 days for shingles. Patient to follow up as needed.   Consult Visit: 02/28/20 Renato Shin, MD (endocrinology): Patient presented for osteoporosis follow up. DEXA repeated and showed slight improvement. Patient declines Forteo. Increased vitamin D to 3000 units daily and follow up in 1 year for DEXA.  02/06/20 Berton Bon (hematology/oncology): Patient presented for follow up for stem cell transplant. Plan to hold phlebotomy and monitor for the next few months.  08/12/2019- Hematology/ Oncology- Patient presented to Dr. Darrold Junker, MD for BMT survivorship follow up. Patient to obtain repeat chimerism. 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 Patient to return in 08/2020.   Medications: Outpatient Encounter Medications as of 04/26/2020  Medication Sig Note  . amLODipine (NORVASC) 10 MG tablet TAKE ONE TABLET BY MOUTH DAILY   . atorvastatin (LIPITOR) 20 MG tablet TAKE ONE TABLET BY MOUTH DAILY   . BIOTIN 5000 PO Take 1 tablet by mouth daily.   . calcitRIOL (ROCALTROL) 0.25 MCG  capsule TAKE 1 CAPSULE BY MOUTH THREE TIMES A WEEK   . Calcium Carbonate-Vit D-Min (CALCIUM 1200 PO) Take 1 capsule by mouth 2 (two) times a week.    . cholecalciferol (VITAMIN D3) 25 MCG (1000 UNIT) tablet Take 3 tablets (3,000 Units total) by mouth daily.   Marland Kitchen denosumab (PROLIA) 60 MG/ML SOSY injection Prolia   . diclofenac (VOLTAREN) 75 MG EC tablet Take 1 tablet (75 mg total) by mouth 2 (two) times daily.   Marland Kitchen levothyroxine (SYNTHROID) 50 MCG tablet Take 1 tablet (50 mcg total) by mouth daily.   Marland Kitchen lidocaine-prilocaine (EMLA) cream Apply 1 application topically as needed.   . loratadine (CLARITIN) 10 MG tablet Take 10 mg by mouth daily. allertec 01/24/2015: Received from: [facility] Received Sig: Take 10 mg by mouth.  . Magnesium Oxide 250 MG TABS Take 1 tablet by mouth daily.    . metoprolol tartrate (LOPRESSOR) 25 MG tablet TAKE ONE TABLET BY MOUTH TWICE A DAY WITH FOOD   . Multiple Vitamin (MULTIVITAMIN) tablet Take 1 tablet by mouth daily.   . Multiple Vitamins-Minerals (MACULAR VITAMIN BENEFIT PO) Take 1 tablet by mouth daily.   . Potassium Gluconate 550 (90 K) MG TABS Take 1 tablet by mouth daily.    . valACYclovir (VALTREX) 1000 MG tablet TAKE ONE TABLET BY MOUTH THREE TIMES A DAY   . zoledronic acid (RECLAST) 5 MG/100ML SOLN injection Reclast    Facility-Administered Encounter Medications as of 04/26/2020  Medication  . denosumab (PROLIA) injection 60 mg    Current Diagnosis/Assessment:  Goals Addressed            This  Visit's Progress   . Pharmacy Care Plan       CARE PLAN ENTRY  Current Barriers:  . Chronic Disease Management support, education, and care coordination needs related to Hypertension, Hyperlipidemia, and Hypothyroidism, Osteoporosis, Osteoarthritis, Allergic rhinitis  Hypertension . Current antihypertensive regimen:  o amlodipine 10mg , 1 tablt once daily  o metoprolol tartrate 25mg , 1 tablet twice daily  . Previous  antihypertensives tried: none . Last practice recorded BP readings:  BP Readings from Last 3 Encounters:  09/20/19 (!) 142/70  07/26/19 124/68  02/28/19 124/60 .  Current home BP readings: does not check at home  Hyperlipidemia . Current antihyperlipidemic regimen: atorvastatin 20mg , 1 tablet once daily  . Previous antihyperlipidemic medications tried none . Most recent lipid panel:     Component Value Date/Time   CHOL 207 (H) 09/20/2019 0931   TRIG 247.0 (H) 09/20/2019 0931   HDL 57.40 09/20/2019 0931   CHOLHDL 4 09/20/2019 0931   VLDL 49.4 (H) 09/20/2019 0931   LDLCALC 116 (H) 02/07/2019 0826   LDLDIRECT 101.0 09/20/2019 0931 .  The 10-year ASCVD risk score Mikey Bussing DC Jr., et al., 2013) is: 22.6%  Hypothyroidism . Current regimen: levothyroxine 10mcg, 1 tablet once daily  TSH  Date Value Ref Range Status  09/20/2019 2.38 0.35 - 4.50 uIU/mL Final   Osteoporosis . Current regimen:  o Prolia 60mg  injection every 6 months o Reclast IV, every year o calcitriol 0.23mcg, 1 tablet 3 days per week o vitamin D, 1086mcg, 1 tablet daily  o calcium 1200mg  1 tablet twice daily   Allergic rhinitis . Current regimen:  loratadine 10mg , 1 tablet once daily   Osteoarthritis . Current regimen: diclofenac 75mg , 1 tablet twice daily    Pharmacist Clinical Goal(s):  Marland Kitchen Hypertension o Over the next 180 days, patient will maintain blood pressure <140/90 mmHg. Marland Kitchen Hyperlipidemia o Over the next 180 days, patient will lower ASCVD risk by continuing statin medication and lifestyle modifications (diet/ exercise).  . Hypothyroidism o Over the next 180 days, patient will maintain TSH between 0.45 to 4.5uIU/ml. . Osteoporosis o Over the next 180 days, patient will minimize risk of fractures by continuing appointments for Reclast infusions and Prolia injections. . Osteoarthritis o Over the next 90 days, Patient will minimize pain levels by continuing current regimen and obtain steroid injections  every 3 months as needed.  Interventions: . Comprehensive medication review performed. . Hypertension o Recommend checking blood pressure 1 to 2 times per week.  o Discussed diet modifications. DASH diet:  following a diet emphasizing fruits and vegetables and low-fat dairy products along with whole grains, fish, poultry, and nuts. Reducing red meats and sugars.  o Discussed the importance of checking blood pressure at home. Marland Kitchen Hyperlipidemia o We discussed how a diet high in plant sterols (fruits/vegetables/nuts/whole grains/legumes) may reduce your cholesterol.  Encouraged increasing fiber to a daily intake of 10-25g/day   Patient Self Care Activities:  . Patient verbalizes understanding of plan as described above, Self administers medications as prescribed, Calls pharmacy for medication refills, and Calls provider office for new concerns or questions . Over next 180 days, patient will: . Continue current medications as directed by providers.  . Continue following up with primary care provider and/or specialists.  Please see past updates related to this goal by clicking on the "Past Updates" button in the selected goal          Hypertension   Office blood pressures are  BP Readings from Last 3 Encounters:  02/28/20 140/60  12/12/19 120/68  09/20/19 (!) 142/70   Patient has failed these meds in the past: none   Patient checks BP at home patient does not check at home   Patient is controlled on:  - amlodipine 10mg , 1 tablet once daily  - metoprolol tartrate 25mg , 1 tablet twice daily   We discussed diet and exercise extensively  . Diet: patient and husband go out to eat a few times a week (fast food and sit down); discussed looking at nutrition labels for menus that have information available . Exercising - Patient reports going on short walks; patient is limited by significant back pain . Discussed the importance of blood pressure monitoring at home. . Recommend using a  salt substitute to replace your salt if you need flavor.   - Patient reports eating some prepackaged foods - recommended reading nutrition labels for sodium content  Plan Continue current medications and control with diet and exercise   Hyperlipidemia  LDL goal < 100  Lipid Panel     Component Value Date/Time   CHOL 207 (H) 09/20/2019 0931   TRIG 247.0 (H) 09/20/2019 0931   HDL 57.40 09/20/2019 0931   CHOLHDL 4 09/20/2019 0931   VLDL 49.4 (H) 09/20/2019 0931   LDLCALC 116 (H) 02/07/2019 0826   LDLDIRECT 101.0 09/20/2019 0931    The 10-year ASCVD risk score Mikey Bussing DC Jr., et al., 2013) is: 27.3%   Values used to calculate the score:     Age: 76 years     Sex: Female     Is Non-Hispanic African American: No     Diabetic: No     Tobacco smoker: No     Systolic Blood Pressure: 009 mmHg     Is BP treated: Yes     HDL Cholesterol: 57.4 mg/dL     Total Cholesterol: 207 mg/dL   Patient has failed these meds in past: none  Patient is currently controlled on the following medications:   atorvastatin 20mg , 1 tablet once daily   We discussed:  diet and exercise extensively  . How to reduce cholesterol through diet/weight management and physical activity.    . We discussed how a diet high in plant sterols (fruits/vegetables/nuts/whole grains/legumes) may reduce your cholesterol.  Encouraged increasing fiber to a daily intake of 10-25g/day   Plan Continue current medications and control with diet and exercise  Hypothyroidism   TSH  Date Value Ref Range Status  09/20/2019 2.38 0.35 - 4.50 uIU/mL Final   Patient has failed these meds in past: none  Patient is currently controlled on the following medications:  - levothyroxine 9mcg, 1 tablet once daily   Plan Continue current medications.   Allergic rhinitis   Patient has failed these meds in past: none  Patient is currently controlled on the following medications: - loratadine 10mg , 1 tablet once daily   Plan Continue  current medications.   Osteoporosis  Patient has failed these meds in past: Fosamax   Patient is currently managed on the following medications:  - Prolia 60mg  injection every 6 months - Reclast IV, every year - calcitriol 0.18mcg, 1 tablet 3x/ week - vitamin D, 3000 units, 1 tablet daily  - calcium 1200mg  1 tablet 2x daily   BMD  02/28/2020 Lumbar spine L1-L4 (L2) Femoral neck (FN)  T-score -1.1 RFN: -1.9 LFN: -2.0 Change in BMD from previous DXA test (%) up 7.5% up 7.4%  (*) statistically significant  03/02/2019  Lumbar spine L1-L4 (L2) Femoral neck (  FN)  T-score -1.0 RFN: -2.3 LFN: -2.4  Change in BMD from previous DXA test (%) Up 2.1* Down 4.4*  (*) statistically significant  02/25/2018 Lumbar spine L1-L4 Femoral neck (FN) 33% distal radius  T-score -1.6 RFN: -2.1 LFN: -2.1 n/a  Change in BMD from previous DXA test (%) Up 3.1% Down 0.5% n/a  (*) statistically significant  02/16/2017 Lumbar spine L1-L4 (L2) Femoral neck (FN)  T-score -1.9 RFN: -2.0 LFN: -2.0  Change in BMD from previous DXA test (%) -12.1%* -8.1%*  (*) statistically significant  Can't move because of her back - disc problems, uses aspercreme  Plan Managed by Dr. Loanne Drilling (endocrinology).   Patient scheduled to receive Prolia on 10/28 and Reclast was given  on 6/01. (combination has been clarified multiple times through patient messages).  Continue current medications  Osteoarthritis   Patient has failed these meds in past: nabumetone  Patient is currently controlled on the following medications:  - diclofenac 75mg , 1 tablet twice daily  - patient states she obtains steroid injections in knees and hands with [facility] (every 3 months if needed)   - Aspercreme PRN  We discussed: trying Voltaren gel OTC for pain relief to replace diclofenac tablets (alternate with 1 tablet and 1 application) for quicker pain relief  Plan Continue current medications   OTC/ supplements   Patient is currently  on the following medications:   magnesium 250mg , 1 tablet daily   potassium gluconate 550, 1 tablet daily   multivitamin, 1 tablet daily   biotin 5000 mcg, 1 tablet daily   macula supplement, 1 tablet daily   lidocaine- prilocaine cream, apply as needed    Vaccines   Reviewed and discussed patient's vaccination history.    Immunization History  Administered Date(s) Administered  . DTaP / IPV 02/23/2017, 04/20/2017, 06/08/2017  . Fluad Quad(high Dose 65+) 04/26/2019, 04/23/2020  . Influenza Split 04/12/2012, 03/24/2013  . Influenza Whole 04/17/2008, 04/10/2009  . Influenza, High Dose Seasonal PF 03/16/2018  . Influenza-Unspecified 04/03/2014, 06/13/2015  . Moderna SARS-COVID-2 Vaccination 07/18/2019, 08/15/2019  . Pneumococcal Conjugate-13 07/24/2014  . Pneumococcal Polysaccharide-23 05/20/2010  . Zoster 07/04/2014  . Zoster Recombinat (Shingrix) 03/22/2018, 03/24/2018, 05/31/2018, 05/31/2018   Patient received COVID Moderna booster on 02/24/20. Will send message to CMA to add to chart.  Plan  Patient is up to date on all immunizations.   Medication Management   Pt uses  pharmacy for all medications Uses pill box? No - patient does not use pill box; patient has pill bottles in baskets. She states she is used to medications and has a routine to take them.  Pt endorses 100% compliance  We discussed: Current pharmacy is preferred with insurance plan and patient is satisfied with pharmacy services  Plan  Continue current medication management strategy   Follow up: 6 month phone visit 3 month BP assessment with CPA  Jeni Salles, PharmD Clinical Pharmacist [place] [facility] [place]

## 2020-04-26 NOTE — Patient Instructions (Addendum)
Hi Pat,  It was so lovely to get to meet you over the phone today! As we discussed, continue taking your current medications and start checking your blood pressure once or twice a month to make sure your current blood pressure medications are working. I did attach some information as far as tips and tricks that might be helpful to consider while checking blood pressure. Also, consider trying Voltaren gel over the counter for pain relief to see if you can use less of the diclofenac tablets and for more immediate pain relief.  Please call me if you have any questions or need anything before our next touch base!  Best, Maddie  Jeni Salles, PharmD Clinical Pharmacist [place] [facility] [place]   Visit Information  Goals Addressed            This Visit's Progress   . Pharmacy Care Plan       CARE PLAN ENTRY  Current Barriers:  . Chronic Disease Management support, education, and care coordination needs related to Hypertension, Hyperlipidemia, and Hypothyroidism, Osteoporosis, Osteoarthritis, Allergic rhinitis  Hypertension . Current antihypertensive regimen:  o amlodipine 10mg , 1 tablt once daily  o metoprolol tartrate 25mg , 1 tablet twice daily  . Previous antihypertensives tried: none . Last practice recorded BP readings:  BP Readings from Last 3 Encounters:  09/20/19 (!) 142/70  07/26/19 124/68  02/28/19 124/60 .  Current home BP readings: does not check at home  Hyperlipidemia . Current antihyperlipidemic regimen: atorvastatin 20mg , 1 tablet once daily  . Previous antihyperlipidemic medications tried none . Most recent lipid panel:     Component Value Date/Time   CHOL 207 (H) 09/20/2019 0931   TRIG 247.0 (H) 09/20/2019 0931   HDL 57.40 09/20/2019 0931   CHOLHDL 4 09/20/2019 0931   VLDL 49.4 (H) 09/20/2019 0931   LDLCALC 116 (H) 02/07/2019 0826   LDLDIRECT 101.0 09/20/2019 0931 .  The 10-year ASCVD risk score Mikey Bussing DC Jr., et al., 2013) is:  22.6%  Hypothyroidism . Current regimen: levothyroxine 57mcg, 1 tablet once daily  TSH  Date Value Ref Range Status  09/20/2019 2.38 0.35 - 4.50 uIU/mL Final   Osteoporosis . Current regimen:  o Prolia 60mg  injection every 6 months o Reclast IV, every year o calcitriol 0.47mcg, 1 tablet 3 days per week o vitamin D, 1049mcg, 1 tablet daily  o calcium 1200mg  1 tablet twice daily   Allergic rhinitis . Current regimen:  loratadine 10mg , 1 tablet once daily   Osteoarthritis . Current regimen: diclofenac 75mg , 1 tablet twice daily    Pharmacist Clinical Goal(s):  Marland Kitchen Hypertension o Over the next 180 days, patient will maintain blood pressure <140/90 mmHg. Marland Kitchen Hyperlipidemia o Over the next 180 days, patient will lower ASCVD risk by continuing statin medication and lifestyle modifications (diet/ exercise).  . Hypothyroidism o Over the next 180 days, patient will maintain TSH between 0.45 to 4.5uIU/ml. . Osteoporosis o Over the next 180 days, patient will minimize risk of fractures by continuing appointments for Reclast infusions and Prolia injections. . Osteoarthritis o Over the next 90 days, Patient will minimize pain levels by continuing current regimen and obtain steroid injections every 3 months as needed.  Interventions: . Comprehensive medication review performed. . Hypertension o Recommend checking blood pressure 1 to 2 times per week.  o Discussed diet modifications. DASH diet:  following a diet emphasizing fruits and vegetables and low-fat dairy products along with whole grains, fish, poultry, and nuts. Reducing red meats and sugars.  o Discussed the importance of checking blood pressure at home. Marland Kitchen Hyperlipidemia o We discussed how a diet high in plant sterols (fruits/vegetables/nuts/whole grains/legumes) may reduce your cholesterol.  Encouraged increasing fiber to a daily intake of 10-25g/day   Patient Self Care Activities:  . Patient verbalizes understanding of plan as  described above, Self administers medications as prescribed, Calls pharmacy for medication refills, and Calls provider office for new concerns or questions . Over next 180 days, patient will: . Continue current medications as directed by providers.  . Continue following up with primary care provider and/or specialists.  Please see past updates related to this goal by clicking on the "Past Updates" button in the selected goal         The patient verbalized understanding of instructions provided today and declined a print copy of patient instruction materials.   Telephone follow up appointment with pharmacy team member scheduled for: 6 months  How to Take Your Blood Pressure You can take your blood pressure at home with a machine. You may need to check your blood pressure at home:  To check if you have high blood pressure (hypertension).  To check your blood pressure over time.  To make sure your blood pressure medicine is working. Supplies needed: You will need a blood pressure machine, or monitor. You can buy one at a drugstore or online. When choosing one:  Choose one with an arm cuff.  Choose one that wraps around your upper arm. Only one finger should fit between your arm and the cuff.  Do not choose one that measures your blood pressure from your wrist or finger. Your doctor can suggest a monitor. How to prepare Avoid these things for 30 minutes before checking your blood pressure:  Drinking caffeine.  Drinking alcohol.  Eating.  Smoking.  Exercising. Five minutes before checking your blood pressure:  Pee.  Sit in a dining chair. Avoid sitting in a soft couch or armchair.  Be quiet. Do not talk. How to take your blood pressure Follow the instructions that came with your machine. If you have a digital blood pressure monitor, these may be the instructions: 1. Sit up straight. 2. Place your feet on the floor. Do not cross your ankles or legs. 3. Rest your left  arm at the level of your heart. You may rest it on a table, desk, or chair. 4. Pull up your shirt sleeve. 5. Wrap the blood pressure cuff around the upper part of your left arm. The cuff should be 1 inch (2.5 cm) above your elbow. It is best to wrap the cuff around bare skin. 6. Fit the cuff snugly around your arm. You should be able to place only one finger between the cuff and your arm. 7. Put the cord inside the groove of your elbow. 8. Press the power button. 9. Sit quietly while the cuff fills with air and loses air. 10. Write down the numbers on the screen. 11. Wait 2-3 minutes and then repeat steps 1-10. What do the numbers mean? Two numbers make up your blood pressure. The first number is called systolic pressure. The second is called diastolic pressure. An example of a blood pressure reading is "120 over 80" (or 120/80). If you are an adult and do not have a medical condition, use this guide to find out if your blood pressure is normal: Normal  First number: below 120.  Second number: below 80. Elevated  First number: 120-129.  Second number: below 80. Hypertension  stage 1  First number: 130-139.  Second number: 80-89. Hypertension stage 2  First number: 140 or above.  Second number: 22 or above. Your blood pressure is above normal even if only the top or bottom number is above normal. Follow these instructions at home:  Check your blood pressure as often as your doctor tells you to.  Take your monitor to your next doctor's appointment. Your doctor will: ? Make sure you are using it correctly. ? Make sure it is working right.  Make sure you understand what your blood pressure numbers should be.  Tell your doctor if your medicines are causing side effects. Contact a doctor if:  Your blood pressure keeps being high. Get help right away if:  Your first blood pressure number is higher than 180.  Your second blood pressure number is higher than 120. This  information is not intended to replace advice given to you by your health care provider. Make sure you discuss any questions you have with your health care provider. Document Revised: 06/05/2017 Document Reviewed: 11/30/2015 Elsevier Patient Education  2020 [employer].

## 2020-05-03 ENCOUNTER — Ambulatory Visit (INDEPENDENT_AMBULATORY_CARE_PROVIDER_SITE_OTHER): Payer: Medicare PPO | Admitting: *Deleted

## 2020-05-03 ENCOUNTER — Other Ambulatory Visit: Payer: Self-pay

## 2020-05-03 DIAGNOSIS — M810 Age-related osteoporosis without current pathological fracture: Secondary | ICD-10-CM | POA: Diagnosis not present

## 2020-05-03 NOTE — Progress Notes (Signed)
Administered  Prolia to pt--Left arm today.

## 2020-05-08 DIAGNOSIS — Z9481 Bone marrow transplant status: Secondary | ICD-10-CM | POA: Diagnosis not present

## 2020-05-30 NOTE — Progress Notes (Signed)
Please sign encounter 

## 2020-06-01 ENCOUNTER — Other Ambulatory Visit: Payer: Self-pay | Admitting: Family Medicine

## 2020-07-10 ENCOUNTER — Telehealth: Payer: Self-pay | Admitting: Pharmacist

## 2020-07-10 NOTE — Chronic Care Management (AMB) (Signed)
Chronic Care Management Pharmacy Assistant   Name: Marita Burnsed  MRN: [id number] DOB: 03-10-1946  Reason for Encounter: Disease State/ Hypertension Adherence Call  PCP : Nelwyn Salisbury, MD  Allergies:   Allergies  Allergen Reactions  . Other   . Penicillins Other (See Comments)    Gas pocket   . Prunus Persica Hives  . Tape Dermatitis    Blistering - use Mepilex  . Aspirin Hives and Rash    Medications: Outpatient Encounter Medications as of 07/10/2020  Medication Sig Note  . amLODipine (NORVASC) 10 MG tablet TAKE ONE TABLET BY MOUTH DAILY   . atorvastatin (LIPITOR) 20 MG tablet TAKE ONE TABLET BY MOUTH DAILY   . BIOTIN 5000 PO Take 1 tablet by mouth daily.   . calcitRIOL (ROCALTROL) 0.25 MCG capsule TAKE 1 CAPSULE BY MOUTH THREE TIMES A WEEK   . Calcium Carbonate-Vit D-Min (CALCIUM 1200 PO) Take 1 capsule by mouth 2 (two) times a week.    . cholecalciferol (VITAMIN D3) 25 MCG (1000 UNIT) tablet Take 3 tablets (3,000 Units total) by mouth daily.   . diclofenac (VOLTAREN) 75 MG EC tablet Take 1 tablet (75 mg total) by mouth 2 (two) times daily.   Marland Kitchen levothyroxine (SYNTHROID) 50 MCG tablet Take 1 tablet (50 mcg total) by mouth daily.   Marland Kitchen lidocaine-prilocaine (EMLA) cream Apply 1 application topically as needed.   . loratadine (CLARITIN) 10 MG tablet Take 10 mg by mouth daily. allertec 01/24/2015: Received from: [facility] Received Sig: Take 10 mg by mouth.  . Magnesium Oxide 250 MG TABS Take 1 tablet by mouth daily.    . metoprolol tartrate (LOPRESSOR) 25 MG tablet TAKE ONE TABLET BY MOUTH TWICE A DAY WITH FOOD   . Multiple Vitamin (MULTIVITAMIN) tablet Take 1 tablet by mouth daily.   . Multiple Vitamins-Minerals (MACULAR VITAMIN BENEFIT PO) Take 1 tablet by mouth daily.   . Potassium Gluconate 550 (90 K) MG TABS Take 1 tablet by mouth daily.    . valACYclovir (VALTREX) 1000 MG tablet TAKE ONE TABLET BY MOUTH THREE TIMES A DAY   .  zoledronic acid (RECLAST) 5 MG/100ML SOLN injection Reclast    No facility-administered encounter medications on file as of 07/10/2020.    Current Diagnosis: Patient Active Problem List   Diagnosis Date Noted  . Iron overload 08/16/2018  . HTN (hypertension) 02/12/2017  . Port catheter in place 10/30/2015  . 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 Thrombocytopenia (HCC) 11/13/2014  . Absolute anemia   . POSTMENOPAUSAL BLEEDING 02/02/2009  . Osteoporosis 04/17/2008  . Hypothyroidism 04/06/2007  . Hyperlipidemia 04/06/2007  . Anemia 04/06/2007  . Allergic rhinitis 04/06/2007  . Osteoarthritis 04/06/2007  . RENAL CALCULUS, HX OF 04/06/2007    Goals Addressed   None    Reviewed chart prior to disease state call. Spoke with patient regarding BP  Recent Office Vitals: BP Readings from Last 3 Encounters:  02/28/20 140/60  12/12/19 120/68  09/20/19 (!) 142/70   Pulse Readings from Last 3 Encounters:  02/28/20 97  12/12/19 97  09/20/19 100    Wt Readings from Last 3 Encounters:  02/28/20 174 lb 12.8 oz (79.3 kg)  12/12/19 177 lb (80.3 kg)  09/20/19 173 lb (78.5 kg)     Kidney Function Lab Results  Component Value Date/Time   CREATININE 1.21 (H) 11/28/2019 10:04 AM   CREATININE 1.05 09/20/2019 09:31 AM   CREATININE 1.0 07/06/2017 10:17 AM  CREATININE 1.1 05/14/2017 10:46 AM   GFR 43.31 (L) 11/28/2019 10:04 AM   GFRNONAA 83.43 05/20/2010 09:44 AM   GFRAA 93 04/10/2008 02:57 PM    BMP Latest Ref Rng & Units 11/28/2019 09/20/2019 02/28/2019  Glucose 70 - 99 mg/dL 858(I) 502(D) -  BUN 6 - 23 mg/dL 74(J) 19 -  Creatinine 0.40 - 1.20 mg/dL 2.87(O) 6.76 -  Sodium 135 - 145 mEq/L 141 142 -  Potassium 3.5 - 5.1 mEq/L 4.5 4.5 -  Chloride 96 - 112 mEq/L 107 106 -  CO2 19 - 32 mEq/L 27 28 -  Calcium 8.4 - 10.5 mg/dL 9.2 9.2 9.4    . Current antihypertensive regimen:  o Amlodipine (NORVASC) 10 MG tablet -Daily o Metoprolol tartrate (LOPRESSOR) 25 MG tablet- twice  daily . How often are you checking your Blood Pressure? infrequently . Current home BP readings: She did not have any current readings. . What recent interventions/DTPs have been made by any provider to improve Blood Pressure control since last CPP Visit: None . Any recent hospitalizations or ED visits since last visit with CPP? Yes . What diet changes have been made to improve Blood Pressure Control?  o No Changes . What exercise is being done to improve your Blood Pressure Control?  o No Changes  Adherence Review: Is the patient currently on ACE/ARB medication? No Does the patient have >5 day gap between last estimated fill dates? No  Follow-Up:  Pharmacist Review  I spoke with Mrs. Vallier and discussed medication adherence with the patient, no issues at this time with current medication. She states have been doing well. She had one change in her medication. She now takes Diclofenac (VOLTAREN) 75 MG EC tablet daily and not twice daily. She denies any side effects with her medications. Also, the patient denies any problems with his current pharmacy -[facility].  Berenice Bouton, [facility] Clinical Pharmacy Assistant [phone]

## 2020-08-29 ENCOUNTER — Other Ambulatory Visit: Payer: Self-pay | Admitting: Endocrinology

## 2020-08-29 ENCOUNTER — Other Ambulatory Visit: Payer: Self-pay | Admitting: Family Medicine

## 2020-09-20 ENCOUNTER — Encounter: Payer: Self-pay | Admitting: Family Medicine

## 2020-09-20 ENCOUNTER — Other Ambulatory Visit: Payer: Self-pay

## 2020-09-21 ENCOUNTER — Ambulatory Visit (INDEPENDENT_AMBULATORY_CARE_PROVIDER_SITE_OTHER): Payer: Medicare Other | Admitting: Family Medicine

## 2020-09-21 ENCOUNTER — Encounter: Payer: Self-pay | Admitting: Family Medicine

## 2020-09-21 VITALS — BP 110/58 | HR 87 | Temp 98.1°F | Ht 59.0 in | Wt 166.6 lb

## 2020-09-21 DIAGNOSIS — M159 Polyosteoarthritis, unspecified: Secondary | ICD-10-CM

## 2020-09-21 DIAGNOSIS — M8949 Other hypertrophic osteoarthropathy, multiple sites: Secondary | ICD-10-CM | POA: Diagnosis not present

## 2020-09-21 DIAGNOSIS — E039 Hypothyroidism, unspecified: Secondary | ICD-10-CM | POA: Diagnosis not present

## 2020-09-21 DIAGNOSIS — E782 Mixed hyperlipidemia: Secondary | ICD-10-CM | POA: Diagnosis not present

## 2020-09-21 DIAGNOSIS — R739 Hyperglycemia, unspecified: Secondary | ICD-10-CM | POA: Diagnosis not present

## 2020-09-21 DIAGNOSIS — M810 Age-related osteoporosis without current pathological fracture: Secondary | ICD-10-CM

## 2020-09-21 DIAGNOSIS — C9201 Acute myeloblastic leukemia, in remission: Secondary | ICD-10-CM

## 2020-09-21 LAB — CBC WITH DIFFERENTIAL/PLATELET
Basophils Absolute: 0.1 10*3/uL (ref 0.0–0.1)
Basophils Relative: 2.1 % (ref 0.0–3.0)
Eosinophils Absolute: 0.2 10*3/uL (ref 0.0–0.7)
Eosinophils Relative: 2.4 % (ref 0.0–5.0)
HCT: 35.9 % — ABNORMAL LOW (ref 36.0–46.0)
Hemoglobin: 11.8 g/dL — ABNORMAL LOW (ref 12.0–15.0)
Lymphocytes Relative: 20.7 % (ref 12.0–46.0)
Lymphs Abs: 1.3 10*3/uL (ref 0.7–4.0)
MCHC: 33 g/dL (ref 30.0–36.0)
MCV: 93.2 fl (ref 78.0–100.0)
Monocytes Absolute: 0.7 10*3/uL (ref 0.1–1.0)
Monocytes Relative: 10.9 % (ref 3.0–12.0)
Neutro Abs: 4.2 10*3/uL (ref 1.4–7.7)
Neutrophils Relative %: 63.9 % (ref 43.0–77.0)
Platelets: 248 10*3/uL (ref 150.0–400.0)
RBC: 3.85 Mil/uL — ABNORMAL LOW (ref 3.87–5.11)
RDW: 15.3 % (ref 11.5–15.5)
WBC: 6.5 10*3/uL (ref 4.0–10.5)

## 2020-09-21 LAB — HEPATIC FUNCTION PANEL
ALT: 17 U/L (ref 0–35)
AST: 13 U/L (ref 0–37)
Albumin: 3.9 g/dL (ref 3.5–5.2)
Alkaline Phosphatase: 58 U/L (ref 39–117)
Bilirubin, Direct: 0.1 mg/dL (ref 0.0–0.3)
Total Bilirubin: 0.6 mg/dL (ref 0.2–1.2)
Total Protein: 6.4 g/dL (ref 6.0–8.3)

## 2020-09-21 LAB — LIPID PANEL
Cholesterol: 180 mg/dL (ref 0–200)
HDL: 55.5 mg/dL (ref >39.00)
LDL Cholesterol: 91 mg/dL (ref 0–99)
NonHDL: 124.63
Total CHOL/HDL Ratio: 3
Triglycerides: 167 mg/dL — ABNORMAL HIGH (ref 0.0–149.0)
VLDL: 33.4 mg/dL (ref 0.0–40.0)

## 2020-09-21 LAB — T3, FREE: T3, Free: 2.9 pg/mL (ref 2.3–4.2)

## 2020-09-21 LAB — TSH: TSH: 3.5 u[IU]/mL (ref 0.35–4.50)

## 2020-09-21 LAB — BASIC METABOLIC PANEL
BUN: 28 mg/dL — ABNORMAL HIGH (ref 6–23)
CO2: 28 mEq/L (ref 19–32)
Calcium: 9 mg/dL (ref 8.4–10.5)
Chloride: 106 mEq/L (ref 96–112)
Creatinine, Ser: 1.35 mg/dL — ABNORMAL HIGH (ref 0.40–1.20)
GFR: 38.2 mL/min — ABNORMAL LOW (ref >60.00)
Glucose, Bld: 101 mg/dL — ABNORMAL HIGH (ref 70–99)
Potassium: 4.8 mEq/L (ref 3.5–5.1)
Sodium: 143 mEq/L (ref 135–145)

## 2020-09-21 LAB — T4, FREE: Free T4: 0.86 ng/dL (ref 0.60–1.60)

## 2020-09-21 LAB — HEMOGLOBIN A1C: Hgb A1c MFr Bld: 6.6 % — ABNORMAL HIGH (ref 4.6–6.5)

## 2020-09-21 MED ORDER — ATORVASTATIN CALCIUM 20 MG PO TABS: 20.0000 mg | ORAL_TABLET | Freq: Every day | ORAL | 3 refills | Status: DC

## 2020-09-21 MED ORDER — LEVOTHYROXINE SODIUM 50 MCG PO TABS: 50.0000 ug | ORAL_TABLET | Freq: Every day | ORAL | 3 refills | Status: DC

## 2020-09-21 MED ORDER — METOPROLOL TARTRATE 25 MG PO TABS: 25.0000 mg | ORAL_TABLET | Freq: Two times a day (BID) | ORAL | 3 refills | Status: DC

## 2020-09-21 MED ORDER — AMLODIPINE BESYLATE 10 MG PO TABS: 10.0000 mg | ORAL_TABLET | Freq: Every day | ORAL | 3 refills | Status: DC

## 2020-09-21 NOTE — Progress Notes (Signed)
   Subjective:    Patient ID: Miranda Mcguire, female    DOB: 06-Oct-1945, 75 y.o.   MRN: [id number]  HPI Here to follow up on issues. She feels well in general. She saw her oncologist at [facility] recently and they said she is doing well. 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 Her BP is stable. Her arthritis bothers her at times but she stays active.    Review of Systems  Constitutional: Negative.   HENT: Negative.   Eyes: Negative.   Respiratory: Negative.   Cardiovascular: Negative.   Gastrointestinal: Negative.   Genitourinary: Negative for decreased urine volume, difficulty urinating, dyspareunia, dysuria, enuresis, flank pain, frequency, hematuria, pelvic pain and urgency.  Musculoskeletal: Positive for arthralgias.  Skin: Negative.   Neurological: Negative.   Psychiatric/Behavioral: Negative.        Objective:   Physical Exam Constitutional:      General: She is not in acute distress.    Appearance: Normal appearance. She is well-developed.  HENT:     Head: Normocephalic and atraumatic.     Right Ear: External ear normal.     Left Ear: External ear normal.     Nose: Nose normal.     Mouth/Throat:     Pharynx: No oropharyngeal exudate.  Eyes:     General: No scleral icterus.    Conjunctiva/sclera: Conjunctivae normal.     Pupils: Pupils are equal, round, and reactive to light.  Neck:     Thyroid: No thyromegaly.     Vascular: No JVD.  Cardiovascular:     Rate and Rhythm: Normal rate and regular rhythm.     Heart sounds: Normal heart sounds. No murmur heard. No friction rub. No gallop.   Pulmonary:     Effort: Pulmonary effort is normal. No respiratory distress.     Breath sounds: Normal breath sounds. No wheezing or rales.  Chest:     Chest wall: No tenderness.  Abdominal:     General: Bowel sounds are normal. There is no distension.     Palpations: Abdomen is soft. There is no mass.     Tenderness: There is no  abdominal tenderness. There is no guarding or rebound.  Musculoskeletal:        General: No tenderness. Normal range of motion.     Cervical back: Normal range of motion and neck supple.  Lymphadenopathy:     Cervical: No cervical adenopathy.  Skin:    General: Skin is warm and dry.     Findings: No erythema or rash.  Neurological:     Mental Status: She is alert and oriented to person, place, and time.     Cranial Nerves: No cranial nerve deficit.     Motor: No abnormal muscle tone.     Coordination: Coordination normal.     Deep Tendon Reflexes: Reflexes are normal and symmetric. Reflexes normal.  Psychiatric:        Behavior: Behavior normal.        Thought Content: Thought content normal.        Judgment: Judgment normal.           Assessment & Plan:  She is doing well. Her AML is in remission. Her HTN and OA are stable. We will get fasting labs for lipids, thyroid levels, etc.  Alysia Penna, MD

## 2020-09-24 ENCOUNTER — Telehealth: Payer: Self-pay | Admitting: Family Medicine

## 2020-09-24 DIAGNOSIS — N1832 Chronic kidney disease, stage 3b: Secondary | ICD-10-CM | POA: Insufficient documentation

## 2020-09-24 NOTE — Telephone Encounter (Signed)
I did the referral to Nephrology

## 2020-09-24 NOTE — Telephone Encounter (Signed)
Patient is calling and wanted to see if provider can refer her to a nephrologist because of her levels, please advise. CB is [phone]

## 2020-09-25 NOTE — Telephone Encounter (Signed)
Spoke with pt advised that referral to Nephrolgy, pt is aware that she will receive a call for scheduling

## 2020-09-26 ENCOUNTER — Other Ambulatory Visit: Payer: Self-pay | Admitting: Family Medicine

## 2020-10-15 ENCOUNTER — Telehealth: Payer: Self-pay

## 2020-10-15 NOTE — Telephone Encounter (Signed)
Prolia VOB initiated via [email or web address] 

## 2020-10-16 NOTE — Telephone Encounter (Signed)
MEDICAL BENEFIT SUMMARY Patient Out-of-Pocket Responsibility Coverage Available Authorization Required Deductible Co-pay/Coinsurance Prolia OOP COST PHYSICIAN FACILITY FEE ADMIN FEE PURCHASE OR REFERRAL: YES NO PRIMARY No SECONDARY No No* No* No* SPECIALTY PHARMACY (via Medical Benefit): YES NO No* No* N/A* *Reflects patient costs once plan deductible is met. Please see Medical Benefit Details for further information regarding patient costs. Patient costs may vary based on services rendered. BENEFITS VERIFIED FOR THE FOLLOWING DIAGNOSIS AND INSURANCE PLANS Verified for Diagnosis M81.0 Site of Care  MD Office   Policy Level: Primary Policy Status: Active Payer Name: [facility] Plan Name: [employer] MEDICARE ADVANTAGE ([place] Number: [id number] Employer Name: [employer] Plan Type: Medicare Managed Care Group Number: [id number]  Payer Phone: [phone] PRIMARY MEDICAL BENEFIT DETAILS (PHYSICIAN PURCHASE, OR REFERRAL TO TREATING SITE) COVERAGE AVAILABLE: Yes COVERAGE DETAILS: Product and administration will be covered at 100% of the contracted rate. No deductible, coinsurance or out of pocket max applies. The benefits provided on this Verification of Benefits form are Medical Benefits and are the patient's In-Network benefits for Prolia. If you would like Pharmacy Benefits for Prolia, please call [phone]. AUTHORIZATION REQUIRED: No

## 2020-10-16 NOTE — Telephone Encounter (Signed)
Pt ready for scheduling on or after 11/02/20  Out-of-pocket cost due at time of visit: $0.00  Prolia co-insurance: 0% Admin fee co-insurance: 0% Deductible does not apply

## 2020-10-22 ENCOUNTER — Telehealth: Payer: Self-pay | Admitting: Pharmacist

## 2020-10-22 NOTE — Chronic Care Management (AMB) (Signed)
I spoke with the patient about his/her upcoming appointment on 04.19.2022 @ 9:00 am with the clinical pharmacist. She stated that she was seen last month, and she also went over her medications with her PCP in January. She stated there is no need for an appointment at this time and she cancelled.   Miranda Mcguire, [place] [phone]

## 2020-10-23 ENCOUNTER — Telehealth: Payer: Medicare PPO

## 2020-10-24 ENCOUNTER — Other Ambulatory Visit: Payer: Self-pay | Admitting: Nephrology

## 2020-10-24 DIAGNOSIS — N1832 Chronic kidney disease, stage 3b: Secondary | ICD-10-CM

## 2020-10-24 NOTE — Telephone Encounter (Signed)
Appt for Prolia inj scheduled: 11/02/2020

## 2020-10-26 ENCOUNTER — Other Ambulatory Visit: Payer: Self-pay | Admitting: Family Medicine

## 2020-10-26 ENCOUNTER — Ambulatory Visit
Admission: RE | Admit: 2020-10-26 | Discharge: 2020-10-26 | Disposition: A | Discharge status: Home / Self Care | Payer: Medicare Other | Source: Ambulatory Visit | Attending: Nephrology | Admitting: Nephrology

## 2020-10-26 DIAGNOSIS — N1832 Chronic kidney disease, stage 3b: Secondary | ICD-10-CM

## 2020-11-02 ENCOUNTER — Ambulatory Visit: Payer: Self-pay

## 2020-11-06 ENCOUNTER — Other Ambulatory Visit: Payer: Self-pay

## 2020-11-06 ENCOUNTER — Ambulatory Visit (INDEPENDENT_AMBULATORY_CARE_PROVIDER_SITE_OTHER): Payer: Medicare Other | Admitting: Endocrinology

## 2020-11-06 DIAGNOSIS — M810 Age-related osteoporosis without current pathological fracture: Secondary | ICD-10-CM | POA: Diagnosis not present

## 2020-11-06 MED ORDER — DENOSUMAB 60 MG/ML ~~LOC~~ SOSY
60.0000 mg | PREFILLED_SYRINGE | Freq: Once | SUBCUTANEOUS | Status: AC
  Administered 2020-11-06: 60 mg via SUBCUTANEOUS

## 2020-11-06 NOTE — Progress Notes (Signed)
Pt was administered a Prolia 60mg /ml injection in her Lt arm

## 2020-11-06 NOTE — Telephone Encounter (Signed)
Pt received Prolia inj at office visit with Dr. Loanne Drilling on 11/06/2020.   Next Prolia inj due 05/10/2021.

## 2020-11-19 ENCOUNTER — Other Ambulatory Visit: Payer: Self-pay | Admitting: Nephrology

## 2020-11-19 DIAGNOSIS — N1832 Chronic kidney disease, stage 3b: Secondary | ICD-10-CM

## 2020-11-23 ENCOUNTER — Other Ambulatory Visit: Payer: Self-pay | Admitting: Nephrology

## 2020-11-23 DIAGNOSIS — N281 Cyst of kidney, acquired: Secondary | ICD-10-CM

## 2020-12-07 ENCOUNTER — Ambulatory Visit
Admission: RE | Admit: 2020-12-07 | Discharge: 2020-12-07 | Disposition: A | Discharge status: Home / Self Care | Payer: Medicare Other | Source: Ambulatory Visit | Attending: Nephrology | Admitting: Nephrology

## 2020-12-07 ENCOUNTER — Encounter: Payer: Self-pay | Admitting: Hematology

## 2020-12-07 ENCOUNTER — Other Ambulatory Visit: Payer: Self-pay

## 2020-12-07 ENCOUNTER — Other Ambulatory Visit: Payer: Medicare Other

## 2020-12-07 DIAGNOSIS — N281 Cyst of kidney, acquired: Secondary | ICD-10-CM

## 2020-12-07 MED ORDER — GADOBENATE DIMEGLUMINE 529 MG/ML IV SOLN
15.0000 mL | Freq: Once | INTRAVENOUS | Status: AC | PRN
  Administered 2020-12-07: 15 mL via INTRAVENOUS

## 2020-12-17 ENCOUNTER — Telehealth: Payer: Self-pay | Admitting: Endocrinology

## 2020-12-17 NOTE — Telephone Encounter (Signed)
Patient called to start process of getting scheduled for a Reclast infusion.  Her last infusion was 12/12/2019, insurance at that time [place], but is now [facility] -  Call back number to schedule Reclast infusion is [phone]

## 2020-12-18 ENCOUNTER — Other Ambulatory Visit: Payer: Self-pay | Admitting: Endocrinology

## 2020-12-18 DIAGNOSIS — M810 Age-related osteoporosis without current pathological fracture: Secondary | ICD-10-CM

## 2020-12-19 NOTE — Telephone Encounter (Signed)
Patient notified that she will need lab work before having the reclast infusion.  She would like to have this done on Monday at [place].

## 2020-12-24 ENCOUNTER — Other Ambulatory Visit: Payer: Self-pay

## 2020-12-24 ENCOUNTER — Encounter: Payer: Self-pay | Admitting: Endocrinology

## 2020-12-24 ENCOUNTER — Other Ambulatory Visit (INDEPENDENT_AMBULATORY_CARE_PROVIDER_SITE_OTHER): Payer: Medicare Other

## 2020-12-24 DIAGNOSIS — M810 Age-related osteoporosis without current pathological fracture: Secondary | ICD-10-CM

## 2020-12-24 LAB — BASIC METABOLIC PANEL
BUN: 24 mg/dL — ABNORMAL HIGH (ref 6–23)
CO2: 27 mEq/L (ref 19–32)
Calcium: 9.6 mg/dL (ref 8.4–10.5)
Chloride: 105 mEq/L (ref 96–112)
Creatinine, Ser: 1.24 mg/dL — ABNORMAL HIGH (ref 0.40–1.20)
GFR: 42.22 mL/min — ABNORMAL LOW (ref >60.00)
Glucose, Bld: 112 mg/dL — ABNORMAL HIGH (ref 70–99)
Potassium: 4.5 mEq/L (ref 3.5–5.1)
Sodium: 141 mEq/L (ref 135–145)

## 2020-12-25 ENCOUNTER — Other Ambulatory Visit: Payer: Self-pay | Admitting: Family Medicine

## 2020-12-25 DIAGNOSIS — Z1231 Encounter for screening mammogram for malignant neoplasm of breast: Secondary | ICD-10-CM

## 2021-01-01 ENCOUNTER — Telehealth: Payer: Self-pay | Admitting: Nutrition

## 2021-01-01 NOTE — Telephone Encounter (Signed)
Scheduled for 02/01/21

## 2021-01-01 NOTE — Telephone Encounter (Signed)
Do you want a reclast infusion for this patient?

## 2021-01-02 ENCOUNTER — Other Ambulatory Visit: Payer: Self-pay

## 2021-01-02 ENCOUNTER — Ambulatory Visit (INDEPENDENT_AMBULATORY_CARE_PROVIDER_SITE_OTHER): Payer: Medicare Other | Admitting: Nutrition

## 2021-01-02 DIAGNOSIS — M810 Age-related osteoporosis without current pathological fracture: Secondary | ICD-10-CM | POA: Diagnosis not present

## 2021-01-02 NOTE — Progress Notes (Signed)
Per Dr. Cordelia Pen note on 01/31/21, and after the patient signed the consent form, an IV was started in patient's right arm using a 23g needle.  Normal saline was infused, and site showed not signes of infiltration.  5mg . Of Zolendronic acid was infused starting at 9:25AM and infused until 10:00.  The patient reported no discomfort, dizziness or site discomfort during the infusion.  The line was flushed with normal saline, and the IV was D/ced.  The IV site showed no signes of redness or swelling.  She was encouraged to drink 4-5 glasses of water today.  She had no final questions

## 2021-01-02 NOTE — Progress Notes (Signed)
Per DR. Elison's note on

## 2021-01-02 NOTE — Telephone Encounter (Signed)
Reclast appointment scheduled for 01/02/21.

## 2021-01-02 NOTE — Patient Instructions (Signed)
Drink 4-6 glasses of water today Continue to take your calcium and Vitamin D per Dr. Cordelia Pen orders

## 2021-01-09 ENCOUNTER — Encounter: Payer: Self-pay | Admitting: Family Medicine

## 2021-01-09 ENCOUNTER — Ambulatory Visit (INDEPENDENT_AMBULATORY_CARE_PROVIDER_SITE_OTHER): Payer: Medicare Other

## 2021-01-09 ENCOUNTER — Other Ambulatory Visit: Payer: Self-pay

## 2021-01-09 DIAGNOSIS — Z0000 Encounter for general adult medical examination without abnormal findings: Secondary | ICD-10-CM

## 2021-01-09 NOTE — Patient Instructions (Signed)
Miranda Mcguire , Thank you for taking time to come for your Medicare Wellness Visit. I appreciate your ongoing commitment to your health goals. Please review the following plan we discussed and let me know if I can assist you in the future.   Screening recommendations/referrals: Colonoscopy: Done 08/07/16  Mammogram: Scheduled 02/15/21 Bone Density: Done 02/17/20 repeat every 2 years  Recommended yearly ophthalmology/optometry visit for glaucoma screening and checkup Recommended yearly dental visit for hygiene and checkup  Vaccinations: Influenza vaccine: due 02/04/21 Pneumococcal vaccine: Completed  Tdap vaccine: Due to be completed every 10 years Shingles vaccine: Completed 9/18 & 05/31/18   Covid-19:Completed 1/11, 2/8, 02/24/20 & 10/15/20  Advanced directives: Please bring a copy of your health care power of attorney and living will to the office at your convenience.  Conditions/risks identified: none at this time  Next appointment: Follow up in one year for your annual wellness visit    Preventive Care 65 Years and Older, Female Preventive care refers to lifestyle choices and visits with your health care provider that can promote health and wellness. What does preventive care include? A yearly physical exam. This is also called an annual well check. Dental exams once or twice a year. Routine eye exams. Ask your health care provider how often you should have your eyes checked. Personal lifestyle choices, including: Daily care of your teeth and gums. Regular physical activity. Eating a healthy diet. Avoiding tobacco and drug use. Limiting alcohol use. Practicing safe sex. Taking low-dose aspirin every day. Taking vitamin and mineral supplements as recommended by your health care provider. What happens during an annual well check? The services and screenings done by your health care provider during your annual well check will depend on your age, overall health, lifestyle risk factors,  and family history of disease. Counseling  Your health care provider may ask you questions about your: Alcohol use. Tobacco use. Drug use. Emotional well-being. Home and relationship well-being. Sexual activity. Eating habits. History of falls. Memory and ability to understand (cognition). Work and work Statistician. Reproductive health. Screening  You may have the following tests or measurements: Height, weight, and BMI. Blood pressure. Lipid and cholesterol levels. These may be checked every 5 years, or more frequently if you are over 24 years old. Skin check. Lung cancer screening. You may have this screening every year starting at age 84 if you have a 30-pack-year history of smoking and currently smoke or have quit within the past 15 years. Fecal occult blood test (FOBT) of the stool. You may have this test every year starting at age 41. Flexible sigmoidoscopy or colonoscopy. You may have a sigmoidoscopy every 5 years or a colonoscopy every 10 years starting at age 42. Hepatitis C blood test. Hepatitis B blood test. Sexually transmitted disease (STD) testing. Diabetes screening. This is done by checking your blood sugar (glucose) after you have not eaten for a while (fasting). You may have this done every 1-3 years. Bone density scan. This is done to screen for osteoporosis. You may have this done starting at age 28. Mammogram. This may be done every 1-2 years. Talk to your health care provider about how often you should have regular mammograms. Talk with your health care provider about your test results, treatment options, and if necessary, the need for more tests. Vaccines  Your health care provider may recommend certain vaccines, such as: Influenza vaccine. This is recommended every year. Tetanus, diphtheria, and acellular pertussis (Tdap, Td) vaccine. You may need a Td booster  every 10 years. Zoster vaccine. You may need this after age 57. Pneumococcal 13-valent conjugate  (PCV13) vaccine. One dose is recommended after age 80. Pneumococcal polysaccharide (PPSV23) vaccine. One dose is recommended after age 79. Talk to your health care provider about which screenings and vaccines you need and how often you need them. This information is not intended to replace advice given to you by your health care provider. Make sure you discuss any questions you have with your health care provider. Document Released: 07/20/2015 Document Revised: 03/12/2016 Document Reviewed: 04/24/2015 Elsevier Interactive Patient Education  2017 [place] Prevention in the Home Falls can cause injuries. They can happen to people of all ages. There are many things you can do to make your home safe and to help prevent falls. What can I do on the outside of my home? Regularly fix the edges of walkways and driveways and fix any cracks. Remove anything that might make you trip as you walk through a door, such as a raised step or threshold. Trim any bushes or trees on the path to your home. Use bright outdoor lighting. Clear any walking paths of anything that might make someone trip, such as rocks or tools. Regularly check to see if handrails are loose or broken. Make sure that both sides of any steps have handrails. Any raised decks and porches should have guardrails on the edges. Have any leaves, snow, or ice cleared regularly. Use sand or salt on walking paths during winter. Clean up any spills in your garage right away. This includes oil or grease spills. What can I do in the bathroom? Use night lights. Install grab bars by the toilet and in the tub and shower. Do not use towel bars as grab bars. Use non-skid mats or decals in the tub or shower. If you need to sit down in the shower, use a plastic, non-slip stool. Keep the floor dry. Clean up any water that spills on the floor as soon as it happens. Remove soap buildup in the tub or shower regularly. Attach bath mats securely with  double-sided non-slip rug tape. Do not have throw rugs and other things on the floor that can make you trip. What can I do in the bedroom? Use night lights. Make sure that you have a light by your bed that is easy to reach. Do not use any sheets or blankets that are too big for your bed. They should not hang down onto the floor. Have a firm chair that has side arms. You can use this for support while you get dressed. Do not have throw rugs and other things on the floor that can make you trip. What can I do in the kitchen? Clean up any spills right away. Avoid walking on wet floors. Keep items that you use a lot in easy-to-reach places. If you need to reach something above you, use a strong step stool that has a grab bar. Keep electrical cords out of the way. Do not use floor polish or wax that makes floors slippery. If you must use wax, use non-skid floor wax. Do not have throw rugs and other things on the floor that can make you trip. What can I do with my stairs? Do not leave any items on the stairs. Make sure that there are handrails on both sides of the stairs and use them. Fix handrails that are broken or loose. Make sure that handrails are as long as the stairways. Check any carpeting to  make sure that it is firmly attached to the stairs. Fix any carpet that is loose or worn. Avoid having throw rugs at the top or bottom of the stairs. If you do have throw rugs, attach them to the floor with carpet tape. Make sure that you have a light switch at the top of the stairs and the bottom of the stairs. If you do not have them, ask someone to add them for you. What else can I do to help prevent falls? Wear shoes that: Do not have high heels. Have rubber bottoms. Are comfortable and fit you well. Are closed at the toe. Do not wear sandals. If you use a stepladder: Make sure that it is fully opened. Do not climb a closed stepladder. Make sure that both sides of the stepladder are locked  into place. Ask someone to hold it for you, if possible. Clearly mark and make sure that you can see: Any grab bars or handrails. First and last steps. Where the edge of each step is. Use tools that help you move around (mobility aids) if they are needed. These include: Canes. Walkers. Scooters. Crutches. Turn on the lights when you go into a dark area. Replace any light bulbs as soon as they burn out. Set up your furniture so you have a clear path. Avoid moving your furniture around. If any of your floors are uneven, fix them. If there are any pets around you, be aware of where they are. Review your medicines with your doctor. Some medicines can make you feel dizzy. This can increase your chance of falling. Ask your doctor what other things that you can do to help prevent falls. This information is not intended to replace advice given to you by your health care provider. Make sure you discuss any questions you have with your health care provider. Document Released: 04/19/2009 Document Revised: 11/29/2015 Document Reviewed: 07/28/2014 Elsevier Interactive Patient Education  2017 [employer].

## 2021-01-09 NOTE — Progress Notes (Signed)
Virtual Visit via Telephone Note  I connected with  Miranda Mcguire on 01/09/21 at 10:15 AM EDT by telephone and verified that I am speaking with the correct person using two identifiers.  Location: Patient: Home Provider: Office Persons participating in the virtual visit: patient/Nurse Health Advisor   I discussed the limitations, risks, security and privacy concerns of performing an evaluation and management service by telephone and the availability of in person appointments. The patient expressed understanding and agreed to proceed.  Interactive audio and video telecommunications were attempted between this nurse and patient, however failed, due to patient having technical difficulties OR patient did not have access to video capability.  We continued and completed visit with audio only.  Some vital signs may be absent or patient reported.   Willette Brace, LPN   Subjective:   Miranda Mcguire is a 75 y.o. female who presents for an Initial Medicare Annual Wellness Visit.  Review of Systems     Cardiac Risk Factors include: advanced age (>78mn, >>97women);hypertension;dyslipidemia;obesity (BMI >30kg/m2)     Objective:    There were no vitals filed for this visit. There is no height or weight on file to calculate BMI.  Advanced Directives 01/09/2021 05/03/2015 03/19/2015 01/24/2015 11/13/2014  Does Patient Have a Medical Advance Directive? Yes Yes Yes - Yes  Type of Advance Directive Living will [place] will - [place] will Living will  Copy of [place] Chart? - Yes Yes No - copy requested No - copy requested    Current Medications (verified) Outpatient Encounter Medications as of 01/09/2021  Medication Sig   amLODipine (NORVASC) 10 MG tablet Take 1 tablet (10 mg total) by mouth daily.   atorvastatin (LIPITOR) 20 MG tablet Take 1 tablet (20 mg total) by mouth daily.   B Complex-C  (SUPER B COMPLEX PO) Take by mouth daily.   calcitRIOL (ROCALTROL) 0.25 MCG capsule TAKE 1 CAPSULE BY MOUTH THREE TIMES PER WEEK   Calcium Carbonate-Vit D-Min (CALCIUM 1200 PO) Take 1 capsule by mouth daily.   cholecalciferol (VITAMIN D3) 25 MCG (1000 UNIT) tablet Take 3 tablets (3,000 Units total) by mouth daily.   denosumab (PROLIA) 60 MG/ML SOSY injection Inject 60 mg into the skin every 6 (six) months.   levothyroxine (SYNTHROID) 50 MCG tablet Take 1 tablet (50 mcg total) by mouth daily.   loratadine (CLARITIN) 10 MG tablet Take 10 mg by mouth daily. allertec   Magnesium Oxide 250 MG TABS Take 1 tablet by mouth daily.    metoprolol tartrate (LOPRESSOR) 25 MG tablet Take 1 tablet (25 mg total) by mouth 2 (two) times daily with a meal.   Multiple Vitamin (MULTIVITAMIN) tablet Take 1 tablet by mouth daily.   Multiple Vitamins-Minerals (MACULAR HEALTH FORMULA PO) Take by mouth daily. Twice a day   Potassium Gluconate 550 (90 K) MG TABS Take 1 tablet by mouth daily. Every other day   zoledronic acid (RECLAST) 5 MG/100ML SOLN injection Reclast   [DISCONTINUED] diclofenac (VOLTAREN) 75 MG EC tablet TAKE ONE TABLET BY MOUTH TWICE A DAY   [DISCONTINUED] Multiple Vitamins-Minerals (MACULAR VITAMIN BENEFIT PO) Take 1 tablet by mouth daily.   No facility-administered encounter medications on file as of 01/09/2021.    Allergies (verified) Other, Penicillins, Prunus persica, Tape, and Aspirin   History: Past Medical History:  Diagnosis Date   Allergy    [place]    se[place] Dr. LMagda Paganini  Lissa Merlin at [facility]    Anemia    nos   Cancer [facility])    Cataracts, bilateral    sees Dr. Baldemar Lenis    Endometrial polyp    post-menopausal bleeding   Hyperlipidemia    Hypertension    OA (osteoarthritis)    OP (osteoporosis)    last dexa 08-15-08   Past Surgical History:  Procedure Laterality Date   COLONOSCOPY  08/06/2016   at [facility], benign polyps, repeat in 5 yrs     DILATION AND CURETTAGE OF UTERUS     endometrial polypectomy     with D and C 03-08-09 pr dr Gardenia Phlegm   herniated disc     L4-5 per Dr Saintclair Halsted   TONSILECTOMY, ADENOIDECTOMY, BILATERAL MYRINGOTOMY AND TUBES     Family History  Problem Relation Age of Onset   Stroke Other    Osteoporosis Sister    Social History   Socioeconomic History   Marital status: Married    Spouse name: Not on file   Number of children: Not on file   Years of education: Not on file   Highest education level: Not on file  Occupational History   Not on file  Tobacco Use   Smoking status: Former    Pack years: 0.00   Smokeless tobacco: Never  Substance and Sexual Activity   Alcohol use: No    Alcohol/week: 0.0 standard drinks   Drug use: No   Sexual activity: Not on file  Other Topics Concern   Not on file  Social History Narrative   Not on file   Social Determinants of Health   Financial Resource Strain: Low Risk    Difficulty of Paying Living Expenses: Not hard at all  Food Insecurity: No Food Insecurity   Worried About Charity fundraiser in the Last Year: Never true   [place] in the Last Year: Never true  Transportation Needs: No Transportation Needs   Lack of Transportation (Medical): No   Lack of Transportation (Non-Medical): No  Physical Activity: Inactive   Days of Exercise per Week: 0 days   Minutes of Exercise per Session: 0 min  Stress: No Stress Concern Present   Feeling of Stress : Not at all  Social Connections: Socially Integrated   Frequency of Communication with Friends and Family: More than three times a week   Frequency of Social Gatherings with Friends and Family: Twice a week   Attends Religious Services: 1 to 4 times per year   Active Member of [employer] or Organizations: Yes   Attends Archivist Meetings: 1 to 4 times per year   Marital Status: Married    Tobacco Counseling Counseling given: Not Answered   Clinical Intake:  Pre-visit preparation  completed: Yes  Pain : No/denies pain     BMI - recorded: 33.65 Nutritional Status: BMI > 30  Obese Nutritional Risks: None Diabetes: No  How often do you need to have someone help you when you read instructions, pamphlets, or other written materials from your doctor or pharmacy?: 1 - Never  Diabetic?No  Interpreter Needed?: No  Information entered by :: Charlott Rakes, LPN   Activities of Daily Living In your present state of health, do you have any difficulty performing the following activities: 01/09/2021  Hearing? N  Vision? N  Difficulty concentrating or making decisions? Y  Comment memory at times  Walking or climbing stairs? N  Dressing or bathing? N  Doing errands, shopping? N  Preparing Food and eating ? N  Using the Toilet? N  In the past six months, have you accidently leaked urine? N  Do you have problems with loss of bowel control? N  Managing your Medications? N  Managing your Finances? N  Housekeeping or managing your Housekeeping? N  Some recent data might be hidden    Patient Care Team: Laurey Morale, MD as PCP - General Luci Bank, MD as Referring Physician (Oncology) Truitt Merle, MD as Consulting Physician (Hematology) Viona Gilmore, [facility] as Pharmacist (Pharmacist)  Indicate any recent Medical Services you may have received from other than [facility] providers in the past year (date may be approximate).     Assessment:   This is a routine wellness examination for Sonny Masters.  Hearing/Vision screen Hearing Screening - Comments:: Pt denies any hearing issues  Vision Screening - Comments:: [facility]   Dietary issues and exercise activities discussed: Current Exercise Habits: The patient does not participate in regular exercise at present   Goals Addressed             This Visit's Progress    Patient Stated       Maintain healthy        Depression Screen PHQ 2/9 Scores 01/09/2021  09/20/2019 08/16/2018 07/06/2015 07/04/2014  PHQ - 2 Score 0 0 0 0 0  PHQ- 9 Score - 0 - - -    Fall Risk Fall Risk  01/09/2021 09/20/2019 08/16/2018 07/06/2015 07/04/2014  Falls in the past year? 0 0 0 No No  Number falls in past yr: 0 - - - -  Injury with Fall? 0 - - - -  Risk for fall due to : Impaired balance/gait;Impaired vision;Impaired mobility - - - -  Risk for fall due to: Comment when  back is in pain - - - -  Follow up Falls prevention discussed - - - -    FALL RISK PREVENTION PERTAINING TO THE HOME:  Any stairs in or around the home? Yes  If so, are there any without handrails? No  Home free of loose throw rugs in walkways, pet beds, electrical cords, etc? Yes  Adequate lighting in your home to reduce risk of falls? Yes   ASSISTIVE DEVICES UTILIZED TO PREVENT FALLS:  Life alert? No  Use of a cane, walker or w/c? No  Grab bars in the bathroom? No  Shower chair or bench in shower? No  Elevated toilet seat or a handicapped toilet? No   TIMED UP AND GO:  Was the test performed? No     Cognitive Function:     6CIT Screen 01/09/2021  What Year? 0 points  What month? 0 points  What time? 0 points  Count back from 20 0 points  Months in reverse 0 points  Repeat phrase 0 points  Total Score 0    Immunizations Immunization History  Administered Date(s) Administered   DTaP / IPV 02/23/2017, 04/20/2017, 06/08/2017   Fluad Quad(high Dose 65+) 04/26/2019, 04/23/2020   Hepatitis B, ped/adol 02/23/2017, 04/20/2017, 08/17/2017   HiB (PRP-OMP) 02/23/2017, 04/20/2017, 06/08/2017   Influenza Split 04/12/2012, 03/24/2013, 04/03/2014, 06/13/2015, 03/16/2018   Influenza Whole 04/17/2008, 04/10/2009   Influenza, High Dose Seasonal PF 06/13/2015, 05/19/2016, 04/20/2017, 03/16/2018   Influenza-Unspecified 04/03/2014, 06/13/2015   MMR 03/10/2019   Moderna Sars-Covid-2 Vaccination 07/18/2019, 08/15/2019, 02/24/2020, 10/15/2020   Pneumococcal Conjugate-13 07/24/2014, 02/23/2017,  04/20/2017, 06/08/2017   Pneumococcal Polysaccharide-23 05/20/2010  Zoster Recombinat (Shingrix) 03/22/2018, 03/24/2018, 05/31/2018, 05/31/2018   Zoster, Live 07/04/2014    TDAP status: Due, Education has been provided regarding the importance of this vaccine. Advised may receive this vaccine at local pharmacy or Health Dept. Aware to provide a copy of the vaccination record if obtained from local pharmacy or Health Dept. Verbalized acceptance and understanding.  Flu Vaccine status: Up to date  Pneumococcal vaccine status: Up to date  Covid-19 vaccine status: Completed vaccines  Qualifies for Shingles Vaccine? Yes   Zostavax completed Yes   Shingrix Completed?: Yes  Screening Tests Health Maintenance  Topic Date Due   Hepatitis C Screening  Never done   TETANUS/TDAP  Never done   INFLUENZA VACCINE  02/04/2021   COVID-19 Vaccine (5 - Booster for Moderna series) 02/14/2021   DEXA SCAN  Completed   PNA vac Low Risk Adult  Completed   Zoster Vaccines- Shingrix  Completed   HPV VACCINES  Aged Out    Health Maintenance  Health Maintenance Due  Topic Date Due   Hepatitis C Screening  Never done   TETANUS/TDAP  Never done    Colorectal cancer screening: Type of screening: Colonoscopy. Completed 07/29/16. Repeat every 5 years per pt   Mammogram scheduled in 02/15/21  Bone Density status: Completed 03/06/20. Results reflect: Bone density results: OSTEOPOROSIS. Repeat every 2 years.    Additional Screening:  Hepatitis C Screening: does qualify;  Vision Screening: Recommended annual ophthalmology exams for early detection of glaucoma and other disorders of the eye. Is the patient up to date with their annual eye exam?  Yes  Who is the provider or what is the name of the office in which the patient attends annual eye exams? Dr [facility] If pt is not established with a provider, would they like to be referred to a provider to establish care? No .   Dental Screening: Recommended  annual dental exams for proper oral hygiene  Community Resource Referral / Chronic Care Management: CRR required this visit?  No   CCM required this visit?  No      Plan:     I have personally reviewed and noted the following in the patient's chart:   Medical and social history Use of alcohol, tobacco or illicit drugs  Current medications and supplements including opioid prescriptions. Patient is not currently taking opioid prescriptions. Functional ability and status Nutritional status Physical activity Advanced directives List of other physicians Hospitalizations, surgeries, and ER visits in previous 12 months Vitals Screenings to include cognitive, depression, and falls Referrals and appointments  In addition, I have reviewed and discussed with patient certain preventive protocols, quality metrics, and best practice recommendations. A written personalized care plan for preventive services as well as general preventive health recommendations were provided to patient.     Willette Brace, LPN   09/07/8327   Nurse Notes: None

## 2021-02-15 ENCOUNTER — Ambulatory Visit
Admission: RE | Admit: 2021-02-15 | Discharge: 2021-02-15 | Disposition: A | Discharge status: Home / Self Care | Payer: Medicare Other | Source: Ambulatory Visit | Attending: Family Medicine | Admitting: Family Medicine

## 2021-02-15 ENCOUNTER — Other Ambulatory Visit: Payer: Self-pay

## 2021-02-15 DIAGNOSIS — Z1231 Encounter for screening mammogram for malignant neoplasm of breast: Secondary | ICD-10-CM

## 2021-02-19 ENCOUNTER — Encounter: Payer: Self-pay | Admitting: Family Medicine

## 2021-02-21 ENCOUNTER — Other Ambulatory Visit: Payer: Self-pay

## 2021-02-21 ENCOUNTER — Other Ambulatory Visit: Payer: Self-pay | Admitting: Endocrinology

## 2021-02-25 ENCOUNTER — Telehealth: Payer: Self-pay | Admitting: Gastroenterology

## 2021-02-25 NOTE — Telephone Encounter (Signed)
Good morning Dr. Candis Schatz,  We have received patient's records from her last colonoscopy on 08/07/2016 at  [facility];  Dr. Adora Fridge, MD.  You are the DOD 02/12/21 4:52PM  Patient would like to have her GI medical concerns treated with our practice. Please advise on scheduling.  Thank you

## 2021-02-28 ENCOUNTER — Ambulatory Visit (INDEPENDENT_AMBULATORY_CARE_PROVIDER_SITE_OTHER): Payer: Medicare Other | Admitting: Endocrinology

## 2021-02-28 ENCOUNTER — Other Ambulatory Visit: Payer: Self-pay

## 2021-02-28 VITALS — BP 110/54 | HR 85 | Ht 59.0 in | Wt 167.4 lb

## 2021-02-28 DIAGNOSIS — M810 Age-related osteoporosis without current pathological fracture: Secondary | ICD-10-CM

## 2021-02-28 LAB — BASIC METABOLIC PANEL
BUN: 25 mg/dL — ABNORMAL HIGH (ref 6–23)
CO2: 27 mEq/L (ref 19–32)
Calcium: 9.6 mg/dL (ref 8.4–10.5)
Chloride: 106 mEq/L (ref 96–112)
Creatinine, Ser: 1.27 mg/dL — ABNORMAL HIGH (ref 0.40–1.20)
GFR: 40.98 mL/min — ABNORMAL LOW (ref >60.00)
Glucose, Bld: 135 mg/dL — ABNORMAL HIGH (ref 70–99)
Potassium: 4.2 mEq/L (ref 3.5–5.1)
Sodium: 141 mEq/L (ref 135–145)

## 2021-02-28 LAB — VITAMIN D 25 HYDROXY (VIT D DEFICIENCY, FRACTURES): VITD: 50.16 ng/mL (ref 30.00–100.00)

## 2021-02-28 NOTE — Patient Instructions (Addendum)
Blood tests are requested for you today.  We'll let you know about the results.  Please continue the same Prolia and Reclast. Please call [phone] to schedule a Bone Density (DexaScan) a the [place] office at [place]. (must be after 03/12/21).  Please come back for a follow-up appointment in 1 year.
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
## 2021-03-01 LAB — PTH, INTACT AND CALCIUM
Calcium: 9.4 mg/dL (ref 8.6–10.4)
PTH: 93 pg/mL — ABNORMAL HIGH (ref 16–77)

## 2021-03-13 ENCOUNTER — Other Ambulatory Visit: Payer: Medicare Other

## 2021-03-13 ENCOUNTER — Ambulatory Visit (INDEPENDENT_AMBULATORY_CARE_PROVIDER_SITE_OTHER): Payer: Medicare Other | Admitting: Gastroenterology

## 2021-03-13 ENCOUNTER — Encounter: Payer: Self-pay | Admitting: Gastroenterology

## 2021-03-13 VITALS — BP 120/70 | HR 88 | Ht 59.0 in | Wt 167.0 lb

## 2021-03-13 DIAGNOSIS — Z86010 Personal history of colonic polyps: Secondary | ICD-10-CM

## 2021-03-13 NOTE — Progress Notes (Signed)
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 Alysia Penna for surveillance colonoscopy.  The patient last underwent a colonoscopy in January 2018.  Five polyps were removed and she was recommended to repeat in 5 years.  I was unable to find the histology report from these polyps. the patient had a colonoscopy in 2009 in which 1 small tubular adenoma and 1 hyperplastic polyp were removed.  The patient denies any chronic GI symptoms to include abdominal pain, constipation, diarrhea or blood in the stool.  She denies any family history of colon cancer. She also has a history of iron overload secondary to frequent blood transfusions.  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  She underwent therapeutic phlebotomy, which was subsequently discontinued when her ferritin levels reached goal and her liver enzymes were normal.  This was managed by her hematologist.  She has follow-up with them early next year. Patient denies any active cardiopulmonary symptoms such as chest pain/pressure, exertional dyspnea, orthopnea or leg swelling.  She denies any previous difficulties or problems with colonoscopies.   Past Medical History:  Diagnosis Date   Allergy    AML (acute myeloid leukemia) ([place])    sees Dr. Phill Myron at [facility]    Anemia    nos   Cancer [facility])    Cataracts, bilateral    sees Dr. 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   COLONOSCOPY  08/06/2016   at [facility], benign polyps, repeat in 5 yrs    DILATION AND CURETTAGE OF UTERUS     endometrial polypectomy     with D and C 03-08-09 pr dr Gardenia Phlegm   herniated disc      L4-5 per Dr Saintclair Halsted   TONSILECTOMY, ADENOIDECTOMY, BILATERAL MYRINGOTOMY AND TUBES     Family History  Problem Relation Age of Onset   Dementia Mother    Heart disease Father    Osteoporosis Sister    Stroke Other    Colon cancer Neg Hx    Esophageal cancer Neg Hx    Rectal cancer Neg Hx    Social History   Tobacco Use   Smoking status: Former    Types: Cigarettes   Smokeless tobacco: Never  Vaping Use   Vaping Use: Never used  Substance Use Topics   Alcohol use: No    Alcohol/week: 0.0 standard drinks   Drug use: No   Current Outpatient Medications  Medication Sig Dispense Refill   amLODipine (NORVASC) 10 MG tablet Take 1 tablet (10 mg total) by mouth daily. 90 tablet 3   atorvastatin (LIPITOR) 20 MG tablet Take 1 tablet (20 mg total) by mouth daily. 90 tablet 3   B Complex-C (SUPER B COMPLEX PO) Take by mouth daily.     calcitRIOL (ROCALTROL) 0.25 MCG capsule TAKE ONE CAPSULE BY MOUTH THREE TIMES A WEEK 39 capsule 2   Calcium Carbonate-Vit D-Min (CALCIUM 1200 PO) Take 1 capsule by mouth daily.     cholecalciferol (VITAMIN D3) 25 MCG (1000 UNIT) tablet Take 3 tablets (3,000 Units total) by mouth daily. 90 tablet  11   denosumab (PROLIA) 60 MG/ML SOSY injection Inject 60 mg into the skin every 6 (six) months.     levothyroxine (SYNTHROID) 50 MCG tablet Take 1 tablet (50 mcg total) by mouth daily. 90 tablet 3   loratadine (CLARITIN) 10 MG tablet Take 10 mg by mouth daily. allertec     Magnesium Oxide 250 MG TABS Take 1 tablet by mouth daily.      metoprolol tartrate (LOPRESSOR) 25 MG tablet Take 1 tablet (25 mg total) by mouth 2 (two) times daily with a meal. 180 tablet 3   Multiple Vitamin (MULTIVITAMIN) tablet Take 1 tablet by mouth daily.     Multiple Vitamins-Minerals (MACULAR HEALTH FORMULA PO) Take by mouth daily. Twice a day     Potassium Gluconate 550 (90 K) MG TABS Take 1 tablet by mouth daily. Every other day     zoledronic acid (RECLAST) 5 MG/100ML SOLN injection  Reclast     No current facility-administered medications for this visit.   Allergies  Allergen Reactions   Other    Penicillins Other (See Comments)    Gas pocket    Prunus Persica Hives   Tape Dermatitis    Blistering - use Mepilex   Aspirin Hives and Rash     Review of Systems: All systems reviewed and negative except where noted in HPI.    MM 3D SCREEN BREAST BILATERAL  Result Date: 02/19/2021 CLINICAL DATA:  Screening. EXAM: DIGITAL SCREENING BILATERAL MAMMOGRAM WITH TOMOSYNTHESIS AND CAD TECHNIQUE: Bilateral screening digital craniocaudal and mediolateral oblique mammograms were obtained. Bilateral screening digital breast tomosynthesis was performed. The images were evaluated with computer-aided detection. COMPARISON:  Previous exam(s). ACR Breast Density Category b: There are scattered areas of fibroglandular density. FINDINGS: There are no findings suspicious for malignancy. IMPRESSION: No mammographic evidence of malignancy. A result letter of this screening mammogram will be mailed directly to the patient. RECOMMENDATION: Screening mammogram in one year. (Code:SM-B-01Y) BI-RADS CATEGORY  1: Negative. Electronically Signed   By: Lajean Manes M.D.   On: 02/19/2021 08:17    Physical Exam: BP 120/70   Pulse 88   Ht '4\' 11"'$  (1.499 m)   Wt 167 lb (75.8 kg)   BMI 33.73 kg/m  Constitutional: Pleasant,well-developed, Caucasian female in no acute distress. HEENT: Normocephalic and atraumatic. Conjunctivae are normal. No scleral icterus.  Mallampati 2 Cardiovascular: Normal rate, regular rhythm.  Pulmonary/chest: Effort normal and breath sounds normal. No wheezing, rales or rhonchi. Abdominal: Soft, nondistended, nontender. Bowel sounds active throughout. There are no masses palpable. No hepatomegaly. Extremities: no edema Neurological: Alert and oriented to person place and time. Skin: Skin is warm and dry. No rashes noted. Psychiatric: Normal mood and affect. Behavior is  normal.  CBC    Component Value Date/Time   WBC 6.5 09/21/2020 0853   RBC 3.85 (L) 09/21/2020 0853   HGB 11.8 (L) 09/21/2020 0853   HGB 12.5 07/06/2017 1017   HCT 35.9 (L) 09/21/2020 0853   HCT 37.5 07/06/2017 1017   PLT 248.0 09/21/2020 0853   PLT 77 (L) 07/06/2017 1017   MCV 93.2 09/21/2020 0853   MCV 98.8 07/06/2017 1017   MCH 32.8 07/06/2017 1017   MCH 29.4 11/13/2014 1749   MCHC 33.0 09/21/2020 0853   RDW 15.3 09/21/2020 0853   RDW 14.4 07/06/2017 1017   LYMPHSABS 1.3 09/21/2020 0853   LYMPHSABS 1.0 07/06/2017 1017   MONOABS 0.7 09/21/2020 0853   MONOABS 0.7 07/06/2017 1017   EOSABS 0.2 09/21/2020 0853  EOSABS 1.2 (H) 07/06/2017 1017   BASOSABS 0.1 09/21/2020 0853   BASOSABS 0.1 07/06/2017 1017    CMP     Component Value Date/Time   NA 141 02/28/2021 0918   NA 139 07/06/2017 1017   K 4.2 02/28/2021 0918   K 4.1 07/06/2017 1017   CL 106 02/28/2021 0918   CO2 27 02/28/2021 0918   CO2 24 07/06/2017 1017   GLUCOSE 135 (H) 02/28/2021 0918   GLUCOSE 112 07/06/2017 1017   BUN 25 (H) 02/28/2021 0918   BUN 16.0 07/06/2017 1017   CREATININE 1.27 (H) 02/28/2021 0918   CREATININE 1.0 07/06/2017 1017   CALCIUM 9.4 02/28/2021 0918   CALCIUM 9.6 02/28/2021 0918   CALCIUM 8.9 07/06/2017 1017   PROT 6.4 09/21/2020 0853   PROT 6.3 (L) 07/06/2017 1017   ALBUMIN 3.9 09/21/2020 0853   ALBUMIN 3.4 (L) 07/06/2017 1017   AST 13 09/21/2020 0853   AST 54 (H) 07/06/2017 1017   ALT 17 09/21/2020 0853   ALT 112 (H) 07/06/2017 1017   ALKPHOS 58 09/21/2020 0853   ALKPHOS 138 07/06/2017 1017   BILITOT 0.6 09/21/2020 0853   BILITOT 0.49 07/06/2017 1017   GFRNONAA 83.43 05/20/2010 0944   GFRAA 93 04/10/2008 1457   Colonoscopy Jan 2018:  Diverticulosis, 5 polyps (3 cecum, 1 AC, 1 Sigmoid), recommended to repeat in 5 years Colonoscopy 2009: 1 rectal TA, 1 rectal HP  ASSESSMENT AND PLAN: 75 year old female with a history of AML currently in remission following a stem cell  transplant due for surveillance colonoscopy.  She has no chronic GI symptoms no family history of colon cancer.  She has no active cardiopulmonary symptoms that would be of concern for elective sedated procedure.  We will schedule the patient for a surveillance colonoscopy to be performed in February 2023.  Currently, we are not booking procedures that far out.  We will contact the patient when the February schedule is available.  Patient can continue to follow-up with hematology regarding her iron overload and determine if further phlebotomy is indicated.  Personal history of colon polyps -Colonoscopy in Feb 2023  Iron overload with hepatic hemosiderosis -F/u hematology   The details, risks (including bleeding, perforation, infection, missed lesions, medication reactions and possible hospitalization or surgery if complications occur), benefits, and alternatives to colonoscopy with possible biopsy and possible polypectomy were discussed with the patient and she consents to proceed.   Longino Trefz E. Candis Schatz, MD [facility]    CC: Laurey Morale, MD

## 2021-03-13 NOTE — Patient Instructions (Signed)
If you are age 77 or older, your body mass index should be between 23-30. Your Body mass index is 33.73 kg/m. If this is out of the aforementioned range listed, please consider follow up with your Primary Care Provider.  If you are age 31 or younger, your body mass index should be between 19-25. Your Body mass index is 33.73 kg/m. If this is out of the aformentioned range listed, please consider follow up with your Primary Care Provider.   We will call you to schedule a direct colon in the beginning of next year.  The [facility] providers would like to encourage you to use [facility] to communicate with providers for non-urgent requests or questions.  Due to long hold times on the telephone, sending your provider a message by [facility] may be a faster and more efficient way to get a response.  Please allow 48 business hours for a response.  Please remember that this is for non-urgent requests.   It was a pleasure to see you today!  Thank you for trusting me with your gastrointestinal care!    Scott E. Candis Schatz, MD

## 2021-04-13 NOTE — Telephone Encounter (Signed)
Prolia VOB initiated via [email or web address]  Last OV:  Next OV:  Last Prolia inj: 11/06/20 Next Prolia inj DUE: 05/10/21

## 2021-04-17 NOTE — Telephone Encounter (Addendum)
Pt ready for scheduling on or after 05/10/21  Out-of-pocket cost due at time of visit: $0.00  Primary: [employer] Medicare Prolia co-insurance: 0% Admin fee co-insurance: 0%  Secondary: n/a Prolia co-insurance:  Admin fee co-insurance:   Deductible: does not apply  Prior Auth: not required PA# Valid:     ** This summary of benefits is an estimation of the patient's out-of-pocket cost. Exact cost may very based on individual plan coverage.

## 2021-04-18 ENCOUNTER — Telehealth: Payer: Self-pay | Admitting: Pharmacist

## 2021-04-18 NOTE — Chronic Care Management (AMB) (Signed)
Chronic Care Management Pharmacy Assistant   Name: Miranda Mcguire  MRN: [id number] DOB: 1946/04/11  Reason for Encounter: Disease State Hypertension Assessment Call    Conditions to be addressed/monitored: HTN  Recent office visits:  01/09/21 Willette Brace, LPN - Patient presented for Medicare Annual Wellness exam. No medication changes.  01/02/21 Ocie Doyne, RN- Age related osteoporosis, unspecified pathological fracture presence Osteoporosis, post-menopausal. No medication changes.   Recent consult visits:  03/13/21 Daryel November, MD (Gastroenterology) - Patient presented for History of colonic polyps and other concerns. No medication changes.  02/28/21 Renato Shin, MD (Endocrinology) - Patient presented for Osteoporosis, unspecified osteoporosis type, unspecified pathological fracture presence. No medication changes.  11/06/20 Renato Shin, MD (Endocrinology) - Patient presented for Osteoporosis, unspecified osteoporosis type, unspecified pathological fracture presence. No medication changes.   Hospital visits:  None in previous 6 months  Medications: Outpatient Encounter Medications as of 04/18/2021  Medication Sig Note   amLODipine (NORVASC) 10 MG tablet Take 1 tablet (10 mg total) by mouth daily.    atorvastatin (LIPITOR) 20 MG tablet Take 1 tablet (20 mg total) by mouth daily.    B Complex-C (SUPER B COMPLEX PO) Take by mouth daily.    calcitRIOL (ROCALTROL) 0.25 MCG capsule TAKE ONE CAPSULE BY MOUTH THREE TIMES A WEEK    Calcium Carbonate-Vit D-Min (CALCIUM 1200 PO) Take 1 capsule by mouth daily.    cholecalciferol (VITAMIN D3) 25 MCG (1000 UNIT) tablet Take 3 tablets (3,000 Units total) by mouth daily.    denosumab (PROLIA) 60 MG/ML SOSY injection Inject 60 mg into the skin every 6 (six) months.    levothyroxine (SYNTHROID) 50 MCG tablet Take 1 tablet (50 mcg total) by mouth daily.    loratadine (CLARITIN) 10 MG tablet Take 10 mg by mouth  daily. allertec 01/24/2015: Received from: [facility] Received Sig: Take 10 mg by mouth.   Magnesium Oxide 250 MG TABS Take 1 tablet by mouth daily.     metoprolol tartrate (LOPRESSOR) 25 MG tablet Take 1 tablet (25 mg total) by mouth 2 (two) times daily with a meal.    Multiple Vitamin (MULTIVITAMIN) tablet Take 1 tablet by mouth daily.    Multiple Vitamins-Minerals (MACULAR HEALTH FORMULA PO) Take by mouth daily. Twice a day    Potassium Gluconate 550 (90 K) MG TABS Take 1 tablet by mouth daily. Every other day    zoledronic acid (RECLAST) 5 MG/100ML SOLN injection Reclast    No facility-administered encounter medications on file as of 04/18/2021.  Reviewed chart prior to disease state call. Spoke with patient regarding BP  Recent Office Vitals: BP Readings from Last 3 Encounters:  03/13/21 120/70  02/28/21 (!) 110/54  09/21/20 (!) 110/58   Pulse Readings from Last 3 Encounters:  03/13/21 88  02/28/21 85  09/21/20 87    Wt Readings from Last 3 Encounters:  03/13/21 167 lb (75.8 kg)  02/28/21 167 lb 6.4 oz (75.9 kg)  09/21/20 166 lb 9.6 oz (75.6 kg)     Kidney Function Lab Results  Component Value Date/Time   CREATININE 1.27 (H) 02/28/2021 09:18 AM   CREATININE 1.24 (H) 12/24/2020 07:52 AM   CREATININE 1.0 07/06/2017 10:17 AM   CREATININE 1.1 05/14/2017 10:46 AM   GFR 40.98 (L) 02/28/2021 09:18 AM   GFRNONAA 83.43 05/20/2010 09:44 AM   GFRAA 93 04/10/2008 02:57 PM    BMP Latest Ref Rng & Units 02/28/2021 02/28/2021 12/24/2020  Glucose 70 -  99 mg/dL - 135(H) 112(H)  BUN 6 - 23 mg/dL - 25(H) 24(H)  Creatinine 0.40 - 1.20 mg/dL - 1.27(H) 1.24(H)  Sodium 135 - 145 mEq/L - 141 141  Potassium 3.5 - 5.1 mEq/L - 4.2 4.5  Chloride 96 - 112 mEq/L - 106 105  CO2 19 - 32 mEq/L - 27 27  Calcium 8.6 - 10.4 mg/dL 9.4 9.6 9.6    Current antihypertensive regimen:  amlodipine 10mg , 1 tablt once daily  - metoprolol tartrate 25mg , 1 tablet twice daily   Care  Gaps: BP - 120/70 Hepatitis C Screening - Overdue Flu Vaccine - Overdue COVID Booster #5 - Overdue  Star Rating Drugs: Atorvastatin (Lipitor) 20 mg - Last filled 02/21/21 90 DS at [facility]  04/19/21 Per Gennie Alma patient declined CCM services and un-enrolled  [facility] Pharmacist Assistant [phone]

## 2021-04-19 ENCOUNTER — Telehealth: Payer: Self-pay | Admitting: Pharmacist

## 2021-04-19 NOTE — Chronic Care Management (AMB) (Signed)
    Chronic Care Management Pharmacy Assistant   Name: Miranda Mcguire  MRN: [id number] DOB: 1945-11-05  Reason for Encounter: Schedule follow up visit Spoke with patient, she does not wish to continue with our program.  The benefits of following with Jeni Salles were explained in detail.  Patient states this isn't something she needs assistance with at this time and wish to be unenrolled.   Care Gaps: BP - 120/70 Hepatitis C Screening - Overdue Flu Vaccine - Overdue COVID Booster #5 - Overdue  Star Rating Drugs: Atorvastatin (Lipitor) 20 mg - Last filled 02/21/21 90 DS at [facility] Pharmacist Assistant [phone]

## 2021-04-25 ENCOUNTER — Encounter: Payer: Self-pay | Admitting: Family Medicine

## 2021-05-10 ENCOUNTER — Ambulatory Visit (INDEPENDENT_AMBULATORY_CARE_PROVIDER_SITE_OTHER): Payer: Medicare Other

## 2021-05-10 ENCOUNTER — Other Ambulatory Visit: Payer: Self-pay | Admitting: Nurse Practitioner

## 2021-05-10 ENCOUNTER — Other Ambulatory Visit: Payer: Self-pay

## 2021-05-10 DIAGNOSIS — M810 Age-related osteoporosis without current pathological fracture: Secondary | ICD-10-CM

## 2021-05-10 MED ORDER — DENOSUMAB 60 MG/ML ~~LOC~~ SOSY
60.0000 mg | PREFILLED_SYRINGE | Freq: Once | SUBCUTANEOUS | Status: AC
  Administered 2021-05-10: 60 mg via SUBCUTANEOUS

## 2021-05-10 NOTE — Progress Notes (Signed)
Pt came in for a nurse visit to receive Prolia 60mg /ml injection. Administered in the Lt arm w/o complaints.  Lot: [id number] Exp: 11/04/2023

## 2021-05-10 NOTE — Telephone Encounter (Signed)
Patient received her Prolia injection today and Patient scheduled her next Prolia injection appointment on 11/12/21

## 2021-06-06 NOTE — Telephone Encounter (Signed)
Last Prolia inj 05/10/21 Next Prolia inj due 11/08/21

## 2021-08-16 ENCOUNTER — Encounter: Payer: Self-pay | Admitting: Gastroenterology

## 2021-09-05 ENCOUNTER — Encounter: Payer: Self-pay | Admitting: Gastroenterology

## 2021-09-05 ENCOUNTER — Ambulatory Visit (INDEPENDENT_AMBULATORY_CARE_PROVIDER_SITE_OTHER): Payer: Medicare HMO | Admitting: Gastroenterology

## 2021-09-05 VITALS — BP 110/58 | HR 88 | Ht 59.0 in | Wt 168.1 lb

## 2021-09-05 DIAGNOSIS — Z86010 Personal history of colonic polyps: Secondary | ICD-10-CM

## 2021-09-05 MED ORDER — SUTAB 1479-225-188 MG PO TABS: 1.0000 | ORAL_TABLET | Freq: Once | ORAL | 0 refills | Status: AC

## 2021-09-05 MED ORDER — ONDANSETRON 4 MG PO TBDP: 4.0000 mg | ORAL_TABLET | Freq: Three times a day (TID) | ORAL | 0 refills | Status: DC | PRN

## 2021-09-05 NOTE — Patient Instructions (Signed)
If you are age 78 or older, your body mass index should be between 23-30. Your Body mass index is 33.96 kg/m?Marland Kitchen If this is out of the aforementioned range listed, please consider follow up with your Primary Care Provider. ? ?If you are age 37 or younger, your body mass index should be between 19-25. Your Body mass index is 33.96 kg/m?Marland Kitchen If this is out of the aformentioned range listed, please consider follow up with your Primary Care Provider.  ? ?We have sent the following medications to your pharmacy for you to pick up at your convenience: ?Zofran 4 mg take one tablet before each prep. ? ? ?The [facility] providers would like to encourage you to use [facility] to communicate with providers for non-urgent requests or questions.  Due to long hold times on the telephone, sending your provider a message by [facility] may be a faster and more efficient way to get a response.  Please allow 48 business hours for a response.  Please remember that this is for non-urgent requests.  ? ?It was a pleasure to see you today! ? ?Thank you for trusting me with your gastrointestinal care!   ? ?Scott E.Candis Schatz, MD  ?

## 2021-09-05 NOTE — Progress Notes (Signed)
HPI : Miranda Mcguire is a pleasant 76 year old female with a history of AML status post stem cell transplant currently in remission who I saw in September of this year to discuss surveillance colonoscopy.  The patient's last colonoscopy was in January 2018 at which time she had 5 polyps removed and was recommended repeat in 5 years.  At that visit, I recommended she be scheduled for a colonoscopy in February when the schedule was available.  Patient states that she tried to schedule the colonoscopy last month but was told that she needed a clinic appointment, which is why she is here today.  She has had no changes in her medical history or any symptoms since her last visit.  She continues to deny any chronic GI symptoms such as abdominal pain, constipation, diarrhea or blood in the stool.  She denies any upper GI symptoms such as heartburn, acid regurgitation, dysphagia.  No family history of colon cancer.  She denies any cardiopulmonary symptoms such as chest pain/pressure, palpitations, lightheadedness/dizziness, dyspnea. 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  Her most recent liver enzymes were normal.  She is followed by hematology for this and was not recommended to continue phlebotomy.    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 Alysia Penna for surveillance colonoscopy.  The patient last underwent a colonoscopy in January 2018.  Five polyps were removed and she was recommended to repeat in 5 years.  I was unable to find the histology report from these polyps. the patient had a colonoscopy in 2009 in which 1 small tubular adenoma and 1 hyperplastic polyp were removed.  The patient denies any chronic GI symptoms to include abdominal pain, constipation, diarrhea or blood in the stool.  She denies  any family history of colon cancer. She also has a history of iron overload secondary to frequent blood transfusions.  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  She underwent therapeutic phlebotomy, which was subsequently discontinued when her ferritin levels reached goal and her liver enzymes were normal.  This was managed by her hematologist.  She has follow-up with them early next year. Patient denies any active cardiopulmonary symptoms such as chest pain/pressure, exertional dyspnea, orthopnea or leg swelling.  She denies any previous difficulties or problems with colonoscopies.     Past Medical History:  Diagnosis Date   Allergy    AML (acute myeloid leukemia) ([place])    sees Dr. Phill Myron at [facility]    Anemia    nos   Cancer [facility])    Cataracts, bilateral    sees Dr. 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   COLONOSCOPY  08/06/2016   at [facility], benign polyps, repeat in 5 yrs    DILATION AND CURETTAGE OF UTERUS     endometrial polypectomy     with D and C 03-08-09 pr dr Gardenia Phlegm   herniated disc     L4-5 per Dr Saintclair Halsted   TONSILECTOMY, ADENOIDECTOMY, [place]  History  Problem Relation Age of Onset   Dementia Mother    Heart disease Father    Osteoporosis Sister    Stroke Other    Colon cancer Neg Hx    Esophageal cancer Neg Hx    Rectal cancer Neg Hx    Pancreatic cancer Neg Hx    Social History   Tobacco Use   Smoking status: Former    Types: Cigarettes   Smokeless tobacco: Never  Vaping Use   Vaping Use: Never used  Substance Use Topics   Alcohol use: No    Alcohol/week: 0.0 standard drinks   Drug use: No   Current Outpatient Medications  Medication Sig Dispense Refill    amLODipine (NORVASC) 10 MG tablet Take 1 tablet (10 mg total) by mouth daily. 90 tablet 3   atorvastatin (LIPITOR) 20 MG tablet Take 1 tablet (20 mg total) by mouth daily. 90 tablet 3   B Complex-C (SUPER B COMPLEX PO) Take by mouth daily.     calcitRIOL (ROCALTROL) 0.25 MCG capsule TAKE ONE CAPSULE BY MOUTH THREE TIMES A WEEK 39 capsule 2   Calcium Carbonate-Vit D-Min (CALCIUM 1200 PO) Take 1 capsule by mouth daily.     cholecalciferol (VITAMIN D3) 25 MCG (1000 UNIT) tablet Take 3 tablets (3,000 Units total) by mouth daily. 90 tablet 11   denosumab (PROLIA) 60 MG/ML SOSY injection Inject 60 mg into the skin every 6 (six) months.     levothyroxine (SYNTHROID) 50 MCG tablet Take 1 tablet (50 mcg total) by mouth daily. 90 tablet 3   loratadine (CLARITIN) 10 MG tablet Take 10 mg by mouth daily. allertec     Magnesium Oxide 250 MG TABS Take 1 tablet by mouth daily.      metoprolol tartrate (LOPRESSOR) 25 MG tablet Take 1 tablet (25 mg total) by mouth 2 (two) times daily with a meal. 180 tablet 3   Multiple Vitamin (MULTIVITAMIN) tablet Take 1 tablet by mouth daily.     Multiple Vitamins-Minerals (MACULAR HEALTH FORMULA PO) Take by mouth daily. Twice a day     Potassium Gluconate 550 (90 K) MG TABS Take 1 tablet by mouth daily. Every other day     zoledronic acid (RECLAST) 5 MG/100ML SOLN injection Reclast     No current facility-administered medications for this visit.   Allergies  Allergen Reactions   Other    Penicillins Other (See Comments)    Gas pocket    Prunus Persica Hives    "Peaches"   Tape Dermatitis    Blistering - use Mepilex   Aspirin Hives and Rash     Review of Systems: All systems reviewed and negative except where noted in HPI.    No results found.  Physical Exam: BP (!) 110/58    Pulse 88    Ht 4\' 11"  (1.499 m)    Wt 168 lb 2 oz (76.3 kg)    BMI 33.96 kg/m  Constitutional: Pleasant,well-developed, Caucasian female in no acute distress. HEENT: Normocephalic and  atraumatic. Conjunctivae are normal. No scleral icterus. Neck supple.  Cardiovascular: Normal rate, regular rhythm.  Pulmonary/chest: Effort normal and breath sounds normal. No wheezing, rales or rhonchi. Abdominal: Soft, nondistended, nontender. Bowel sounds active throughout. There are no masses palpable. No hepatomegaly. Extremities: no edema Lymphadenopathy: No cervical adenopathy noted. Neurological: Alert and oriented to person place and time. Skin: Skin is warm and dry. No rashes noted. Psychiatric: Normal mood and affect. Behavior is normal.  CBC    Component  Value Date/Time   WBC 6.5 09/21/2020 0853   RBC 3.85 (L) 09/21/2020 0853   HGB 11.8 (L) 09/21/2020 0853   HGB 12.5 07/06/2017 1017   HCT 35.9 (L) 09/21/2020 0853   HCT 37.5 07/06/2017 1017   PLT 248.0 09/21/2020 0853   PLT 77 (L) 07/06/2017 1017   MCV 93.2 09/21/2020 0853   MCV 98.8 07/06/2017 1017   MCH 32.8 07/06/2017 1017   MCH 29.4 11/13/2014 1749   MCHC 33.0 09/21/2020 0853   RDW 15.3 09/21/2020 0853   RDW 14.4 07/06/2017 1017   LYMPHSABS 1.3 09/21/2020 0853   LYMPHSABS 1.0 07/06/2017 1017   MONOABS 0.7 09/21/2020 0853   MONOABS 0.7 07/06/2017 1017   EOSABS 0.2 09/21/2020 0853   EOSABS 1.2 (H) 07/06/2017 1017   BASOSABS 0.1 09/21/2020 0853   BASOSABS 0.1 07/06/2017 1017    CMP     Component Value Date/Time   NA 141 02/28/2021 0918   NA 139 07/06/2017 1017   K 4.2 02/28/2021 0918   K 4.1 07/06/2017 1017   CL 106 02/28/2021 0918   CO2 27 02/28/2021 0918   CO2 24 07/06/2017 1017   GLUCOSE 135 (H) 02/28/2021 0918   GLUCOSE 112 07/06/2017 1017   BUN 25 (H) 02/28/2021 0918   BUN 16.0 07/06/2017 1017   CREATININE 1.27 (H) 02/28/2021 0918   CREATININE 1.0 07/06/2017 1017   CALCIUM 9.4 02/28/2021 0918   CALCIUM 9.6 02/28/2021 0918   CALCIUM 8.9 07/06/2017 1017   PROT 6.4 09/21/2020 0853   PROT 6.3 (L) 07/06/2017 1017   ALBUMIN 3.9 09/21/2020 0853   ALBUMIN 3.4 (L) 07/06/2017 1017   AST 13  09/21/2020 0853   AST 54 (H) 07/06/2017 1017   ALT 17 09/21/2020 0853   ALT 112 (H) 07/06/2017 1017   ALKPHOS 58 09/21/2020 0853   ALKPHOS 138 07/06/2017 1017   BILITOT 0.6 09/21/2020 0853   BILITOT 0.49 07/06/2017 1017   GFRNONAA 83.43 05/20/2010 0944   GFRAA 93 04/10/2008 1457     ASSESSMENT AND PLAN: 76 year old female with history of AML status post stem cell transplant currently in remission due for surveillance colonoscopy.  It had been my intention for the patient just to be scheduled directly for colonoscopy in February following her initial visit in September (see last note), but this was not accomplished, apparently due to miscommunication.   We will schedule her for her colonoscopy now.  Given her age, unless we find high risk polyps on this next colonoscopy, this should be her last colonoscopy. Patient states that she did have problems with nausea and vomiting during her last colonoscopy.  We will arrange for either a low-volume bowel prep or Sutab and prescribe Zofran as needed for nausea during the prep.  History of colon polyps -Schedule for surveillance colonoscopy with low volume prep versus Sutab -Zofran as needed for nausea with bowel prep  The details, risks (including bleeding, perforation, infection, missed lesions, medication reactions and possible hospitalization or surgery if complications occur), benefits, and alternatives to colonoscopy with possible biopsy and possible polypectomy were discussed with the patient and she consents to proceed.   Carmellia Kreisler E. Candis Schatz, MD [facility]   CC:  Laurey Morale, MD

## 2021-09-24 ENCOUNTER — Ambulatory Visit (INDEPENDENT_AMBULATORY_CARE_PROVIDER_SITE_OTHER): Payer: Medicare HMO | Admitting: Family Medicine

## 2021-09-24 ENCOUNTER — Encounter: Payer: Self-pay | Admitting: Family Medicine

## 2021-09-24 VITALS — BP 126/62 | HR 87 | Temp 98.2°F | Ht 59.0 in | Wt 165.0 lb

## 2021-09-24 DIAGNOSIS — C9201 Acute myeloblastic leukemia, in remission: Secondary | ICD-10-CM

## 2021-09-24 DIAGNOSIS — N1832 Chronic kidney disease, stage 3b: Secondary | ICD-10-CM

## 2021-09-24 DIAGNOSIS — D649 Anemia, unspecified: Secondary | ICD-10-CM | POA: Diagnosis not present

## 2021-09-24 DIAGNOSIS — E039 Hypothyroidism, unspecified: Secondary | ICD-10-CM

## 2021-09-24 DIAGNOSIS — E782 Mixed hyperlipidemia: Secondary | ICD-10-CM | POA: Diagnosis not present

## 2021-09-24 DIAGNOSIS — D696 Thrombocytopenia, unspecified: Secondary | ICD-10-CM

## 2021-09-24 DIAGNOSIS — L989 Disorder of the skin and subcutaneous tissue, unspecified: Secondary | ICD-10-CM

## 2021-09-24 DIAGNOSIS — M810 Age-related osteoporosis without current pathological fracture: Secondary | ICD-10-CM

## 2021-09-24 DIAGNOSIS — I10 Essential (primary) hypertension: Secondary | ICD-10-CM

## 2021-09-24 DIAGNOSIS — R739 Hyperglycemia, unspecified: Secondary | ICD-10-CM | POA: Diagnosis not present

## 2021-09-24 DIAGNOSIS — M159 Polyosteoarthritis, unspecified: Secondary | ICD-10-CM

## 2021-09-24 LAB — BASIC METABOLIC PANEL
BUN: 23 mg/dL (ref 6–23)
CO2: 27 mEq/L (ref 19–32)
Calcium: 9.9 mg/dL (ref 8.4–10.5)
Chloride: 103 mEq/L (ref 96–112)
Creatinine, Ser: 1.14 mg/dL (ref 0.40–1.20)
GFR: 46.46 mL/min — ABNORMAL LOW (ref >60.00)
Glucose, Bld: 110 mg/dL — ABNORMAL HIGH (ref 70–99)
Potassium: 4.1 mEq/L (ref 3.5–5.1)
Sodium: 141 mEq/L (ref 135–145)

## 2021-09-24 LAB — CBC WITH DIFFERENTIAL/PLATELET
Basophils Absolute: 0.1 10*3/uL (ref 0.0–0.1)
Basophils Relative: 1.1 % (ref 0.0–3.0)
Eosinophils Absolute: 0.1 10*3/uL (ref 0.0–0.7)
Eosinophils Relative: 1.5 % (ref 0.0–5.0)
HCT: 41.9 % (ref 36.0–46.0)
Hemoglobin: 13.7 g/dL (ref 12.0–15.0)
Lymphocytes Relative: 22.4 % (ref 12.0–46.0)
Lymphs Abs: 1.7 10*3/uL (ref 0.7–4.0)
MCHC: 32.8 g/dL (ref 30.0–36.0)
MCV: 91.2 fl (ref 78.0–100.0)
Monocytes Absolute: 0.7 10*3/uL (ref 0.1–1.0)
Monocytes Relative: 8.6 % (ref 3.0–12.0)
Neutro Abs: 5.1 10*3/uL (ref 1.4–7.7)
Neutrophils Relative %: 66.4 % (ref 43.0–77.0)
Platelets: 232 10*3/uL (ref 150.0–400.0)
RBC: 4.59 Mil/uL (ref 3.87–5.11)
RDW: 13.4 % (ref 11.5–15.5)
WBC: 7.6 10*3/uL (ref 4.0–10.5)

## 2021-09-24 LAB — HEPATIC FUNCTION PANEL
ALT: 22 U/L (ref 0–35)
AST: 18 U/L (ref 0–37)
Albumin: 4.6 g/dL (ref 3.5–5.2)
Alkaline Phosphatase: 55 U/L (ref 39–117)
Bilirubin, Direct: 0.1 mg/dL (ref 0.0–0.3)
Total Bilirubin: 0.7 mg/dL (ref 0.2–1.2)
Total Protein: 7.5 g/dL (ref 6.0–8.3)

## 2021-09-24 LAB — LIPID PANEL
Cholesterol: 223 mg/dL — ABNORMAL HIGH (ref 0–200)
HDL: 67.1 mg/dL (ref >39.00)
NonHDL: 156.29
Total CHOL/HDL Ratio: 3
Triglycerides: 226 mg/dL — ABNORMAL HIGH (ref 0.0–149.0)
VLDL: 45.2 mg/dL — ABNORMAL HIGH (ref 0.0–40.0)

## 2021-09-24 LAB — HEMOGLOBIN A1C: Hgb A1c MFr Bld: 6.7 % — ABNORMAL HIGH (ref 4.6–6.5)

## 2021-09-24 LAB — LDL CHOLESTEROL, DIRECT: Direct LDL: 118 mg/dL

## 2021-09-24 LAB — T4, FREE: Free T4: 0.77 ng/dL (ref 0.60–1.60)

## 2021-09-24 LAB — T3, FREE: T3, Free: 3.1 pg/mL (ref 2.3–4.2)

## 2021-09-24 LAB — TSH: TSH: 2.09 u[IU]/mL (ref 0.35–5.50)

## 2021-09-24 MED ORDER — METOPROLOL TARTRATE 25 MG PO TABS: 25.0000 mg | ORAL_TABLET | Freq: Two times a day (BID) | ORAL | 3 refills | Status: DC

## 2021-09-24 MED ORDER — ATORVASTATIN CALCIUM 20 MG PO TABS: 20.0000 mg | ORAL_TABLET | Freq: Every day | ORAL | 3 refills | Status: DC

## 2021-09-24 MED ORDER — LEVOTHYROXINE SODIUM 50 MCG PO TABS: 50.0000 ug | ORAL_TABLET | Freq: Every day | ORAL | 3 refills | Status: DC

## 2021-09-24 MED ORDER — AMLODIPINE BESYLATE 10 MG PO TABS: 10.0000 mg | ORAL_TABLET | Freq: Every day | ORAL | 3 refills | Status: DC

## 2021-09-24 NOTE — Progress Notes (Addendum)
? ?Subjective:  ? ? Patient ID: Miranda Mcguire, female    DOB: 10/12/1945, 76 y.o.   MRN: [id number] ? ?HPI ?Here to follow up on issues. She feels well in general. She sees her oncologist regularly and the AML is still in remission. Her BP has been stable. She is scheduled for another colonoscopy with Dr. Dustin Flock on 10-21-21. She sees Dr. Loanne Drilling for care of her osteoporosis. She asks me to check a skin lesion on her left 4th toe that appeared almost a year ago. This has slowly grown larger over time. It rubs against the next toe and it bleeds occasionally.  ? ? ?Review of Systems  ?Constitutional: Negative.   ?HENT: Negative.    ?Eyes: Negative.   ?Respiratory: Negative.    ?Cardiovascular: Negative.   ?Gastrointestinal: Negative.   ?Genitourinary:  Negative for decreased urine volume, difficulty urinating, dyspareunia, dysuria, enuresis, flank pain, frequency, hematuria, pelvic pain and urgency.  ?Musculoskeletal: Negative.   ?Skin: Negative.   ?Neurological: Negative.  Negative for headaches.  ?Psychiatric/Behavioral: Negative.    ? ?   ?Objective:  ? Physical Exam ?Constitutional:   ?   General: She is not in acute distress. ?   Appearance: Normal appearance. She is well-developed.  ?HENT:  ?   Head: Normocephalic and atraumatic.  ?   Right Ear: External ear normal.  ?   Left Ear: External ear normal.  ?   Nose: Nose normal.  ?   Mouth/Throat:  ?   Pharynx: No oropharyngeal exudate.  ?Eyes:  ?   General: No scleral icterus. ?   Conjunctiva/sclera: Conjunctivae normal.  ?   Pupils: Pupils are equal, round, and reactive to light.  ?Neck:  ?   Thyroid: No thyromegaly.  ?   Vascular: No JVD.  ?Cardiovascular:  ?   Rate and Rhythm: Normal rate and regular rhythm.  ?   Heart sounds: Normal heart sounds. No murmur heard. ?  No friction rub. No gallop.  ?Pulmonary:  ?   Effort: Pulmonary effort is normal. No respiratory distress.  ?   Breath sounds: Normal breath sounds. No wheezing or rales.   ?Chest:  ?   Chest wall: No tenderness.  ?Abdominal:  ?   General: Bowel sounds are normal. There is no distension.  ?   Palpations: Abdomen is soft. There is no mass.  ?   Tenderness: There is no abdominal tenderness. There is no guarding or rebound.  ?Musculoskeletal:     ?   General: No tenderness. Normal range of motion.  ?   Cervical back: Normal range of motion and neck supple.  ?Lymphadenopathy:  ?   Cervical: No cervical adenopathy.  ?Skin: ?   General: Skin is warm and dry.  ?   Findings: No erythema or rash.  ?   Comments: There is a pedunculated lesion on the left 4th toe. This appears to be either a wart or a fibroadenoma.   ?Neurological:  ?   Mental Status: She is alert and oriented to person, place, and time.  ?   Cranial Nerves: No cranial nerve deficit.  ?   Motor: No abnormal muscle tone.  ?   Coordination: Coordination normal.  ?   Deep Tendon Reflexes: Reflexes are normal and symmetric. Reflexes normal.  ?Psychiatric:     ?   Behavior: Behavior normal.     ?   Thought Content: Thought content normal.     ?   Judgment: Judgment normal.  ? ? ? ? ? ?   ?  Assessment & Plan:  ?She is doing well as far as her AML and osteoporosis. Her HTN is stable. We will get fasting labs to check a thyroid panel, lipids, etc. She will return sometime soon for [place] to remove the toe lesion. We spent a total of ( 31  ) minutes reviewing records and discussing these issues.  ? ?Alysia Penna, MD ? ? ?

## 2021-09-25 ENCOUNTER — Ambulatory Visit (INDEPENDENT_AMBULATORY_CARE_PROVIDER_SITE_OTHER): Payer: Medicare HMO | Admitting: Family Medicine

## 2021-09-25 ENCOUNTER — Other Ambulatory Visit: Payer: Self-pay | Admitting: Family Medicine

## 2021-09-25 ENCOUNTER — Encounter: Payer: Self-pay | Admitting: Family Medicine

## 2021-09-25 VITALS — BP 120/60 | HR 89 | Temp 98.5°F | Wt 165.0 lb

## 2021-09-25 DIAGNOSIS — L989 Disorder of the skin and subcutaneous tissue, unspecified: Secondary | ICD-10-CM

## 2021-09-25 DIAGNOSIS — L918 Other hypertrophic disorders of the skin: Secondary | ICD-10-CM

## 2021-09-25 DIAGNOSIS — B079 Viral wart, unspecified: Secondary | ICD-10-CM | POA: Diagnosis not present

## 2021-09-25 NOTE — Addendum Note (Signed)
Addended by: Wyvonne Lenz on: 09/25/2021 12:11 PM ? ? Modules accepted: Orders ? ?

## 2021-09-25 NOTE — Progress Notes (Signed)
? ?  Subjective:  ? ? Patient ID: Miranda Mcguire, female    DOB: Oct 22, 1945, 76 y.o.   MRN: [id number] ? ?HPI ?Here to remove a skin lesion from the left 4th toe. We evaluated this yesterday during a well exam.  ? ? ?Review of Systems  ?Constitutional: Negative.   ?Respiratory: Negative.    ?Cardiovascular: Negative.   ? ?   ?Objective:  ? Physical Exam ?Constitutional:   ?   General: She is not in acute distress. ?   Appearance: Normal appearance.  ?Skin: ?   Comments: There is a 7 mm pedunculated dark brown skin lesion on the dorsal left toe  ?Neurological:  ?   Mental Status: She is alert.  ? ? ? ? ? ?   ?Assessment & Plan:  ?After informed consent was obtained, the toe was cleansed with Betadine. Local anesthesia was obtained using 2 % Xylocaine. The lesion was excised using a scalpel, and the sample was sent to Pathology. The wound was closed with a single suture of 5-0 Ethilon. The site was then dressed with Neosporin and a Bandaid. She tolerated the procedure well. She was given instructions on caring for the site, and she will return in 10 days to remove the suture.  ?Alysia Penna, MD ? ? ?

## 2021-10-04 ENCOUNTER — Ambulatory Visit (INDEPENDENT_AMBULATORY_CARE_PROVIDER_SITE_OTHER): Payer: Medicare HMO | Admitting: Family Medicine

## 2021-10-04 ENCOUNTER — Encounter: Payer: Self-pay | Admitting: Family Medicine

## 2021-10-04 VITALS — BP 118/62 | HR 93 | Temp 98.7°F

## 2021-10-04 DIAGNOSIS — L989 Disorder of the skin and subcutaneous tissue, unspecified: Secondary | ICD-10-CM

## 2021-10-04 NOTE — Progress Notes (Signed)
? ?  Subjective:  ? ? Patient ID: Miranda Mcguire, female    DOB: [birth year], 76 y.o.   MRN: [id number] ? ?HPI ?Here for suture removal. On 09-25-21 we removed a lesion from the left 4th toe, and the wound was closed with a single suture. She says the wound feels fine the pathology report determined this was a verrucous leison.  ? ? ?Review of Systems  ?Constitutional: Negative.   ?Respiratory: Negative.    ?Cardiovascular: Negative.   ? ?   ?Objective:  ? Physical Exam ?Constitutional:   ?   Appearance: Normal appearance.  ?Skin: ?   Comments: The surgery site is clean and healing well   ?Neurological:  ?   Mental Status: She is alert.  ? ? ? ? ? ?   ?Assessment & Plan:  ?The single suture was removed. She will follow up as needed. ?Alysia Penna, MD ? ? ?

## 2021-10-07 NOTE — Telephone Encounter (Signed)
Prolia VOB initiated via [email or web address] ? ?Last OV: 02/28/21 ?Next OV:  ?Last Prolia inj: 05/10/21 ?Next Prolia inj DUE: 11/08/21 ? ?

## 2021-10-09 ENCOUNTER — Encounter: Payer: Self-pay | Admitting: Gastroenterology

## 2021-10-09 NOTE — Telephone Encounter (Signed)
Miranda Mcguire, can you please call this patient to explain the letter you sent to her. Thanks ?

## 2021-10-10 DIAGNOSIS — L989 Disorder of the skin and subcutaneous tissue, unspecified: Secondary | ICD-10-CM | POA: Insufficient documentation

## 2021-10-10 NOTE — Addendum Note (Signed)
Addended by: Alysia Penna A on: 10/10/2021 08:00 AM ? ? Modules accepted: Orders ? ?

## 2021-10-21 ENCOUNTER — Telehealth: Payer: Self-pay | Admitting: Gastroenterology

## 2021-10-21 ENCOUNTER — Encounter: Payer: Medicare HMO | Admitting: Gastroenterology

## 2021-10-21 NOTE — Telephone Encounter (Signed)
Good Morning Dr. Candis Schatz, ? ?I called this patient to see if she was coming for procedure today, she stated she called  the answering service last night she was sick and could not take her prep.   ? ?She will call and reschedule. ?

## 2021-10-22 NOTE — Telephone Encounter (Signed)
Please reach out to patient to update insurance and advise once complete.  ? ?Thanks! ? ? ?

## 2021-10-26 NOTE — Telephone Encounter (Signed)
Insurance updated from [facility] Medicare to [employer].  ? ?VOB for Prolia re-submitted.  ?

## 2021-11-11 NOTE — Telephone Encounter (Signed)
Prior auth required for PROLIA  PA PROCESS DETAILS: Precertification is required. Call [phone] or complete the Precertification form available at [email or web address] 

## 2021-11-12 ENCOUNTER — Ambulatory Visit (INDEPENDENT_AMBULATORY_CARE_PROVIDER_SITE_OTHER): Payer: Medicare HMO

## 2021-11-12 ENCOUNTER — Ambulatory Visit: Payer: Medicare Other | Admitting: Endocrinology

## 2021-11-12 DIAGNOSIS — M810 Age-related osteoporosis without current pathological fracture: Secondary | ICD-10-CM | POA: Diagnosis not present

## 2021-11-12 MED ORDER — DENOSUMAB 60 MG/ML ~~LOC~~ SOSY
60.0000 mg | PREFILLED_SYRINGE | Freq: Once | SUBCUTANEOUS | Status: AC
  Administered 2021-11-12: 60 mg via SUBCUTANEOUS

## 2021-11-12 NOTE — Progress Notes (Signed)
Patient seen today for Prolia injection.  '60mg'$ /ml given in left arm subQ.  Patient tolerated well.  Follow up 6 months or as advised by provider. Patient verified name, DOB and gave verbal consent prior to administration. ?

## 2021-11-13 NOTE — Telephone Encounter (Signed)
Pt ready for scheduling on or after 11/13/21 ? ?Out-of-pocket cost due at time of visit: $0 ? ?Primary: [employer] Medicare  ?Prolia co-insurance: 0% ?Admin fee co-insurance: 0% ? ?Secondary: n/a ?Prolia co-insurance:  ?Admin fee co-insurance:  ? ?Deductible: does not apply ? ?Prior Auth: APPROVED ?PA# [id number] ?Valid: 11/13/21-11/13/22 ?  ? ?** This summary of benefits is an estimation of the patient's out-of-pocket cost. Exact cost may very based on individual plan coverage.  ? ?

## 2021-11-13 NOTE — Telephone Encounter (Signed)
Prior Authorization initiated for [facility] via [employer] ?Case ID: [id number] ? ? ? ? ? ?

## 2021-11-13 NOTE — Telephone Encounter (Signed)
Last Prolia inj 11/12/21 ?Next Prolia inj due 05/16/22 ?

## 2021-11-14 ENCOUNTER — Encounter: Payer: Self-pay | Admitting: Internal Medicine

## 2021-11-15 ENCOUNTER — Telehealth: Payer: Self-pay | Admitting: Pharmacy Technician

## 2021-11-15 NOTE — Telephone Encounter (Signed)
Dr Kelton Pillar, ? ?Fyi note: ?Auth Submission: no auth needed ?Payer: aetna medicare ?Medication & CPT/J Code(s) submitted: Reclast (Zolendronic acid) J3489 ?Route of submission (phone, fax, portal): phone ?Auth type: Buy/Bill ?Units/visits requested: x1 dose ?Reference number:  ?Approval from: 11/04/21 to 07/06/22  ?Patient will be scheduled as soon as possible. ?

## 2021-11-18 ENCOUNTER — Other Ambulatory Visit: Payer: Self-pay | Admitting: Endocrinology

## 2021-12-04 ENCOUNTER — Ambulatory Visit (AMBULATORY_SURGERY_CENTER): Payer: Medicare HMO | Admitting: Gastroenterology

## 2021-12-04 ENCOUNTER — Encounter: Payer: Self-pay | Admitting: Gastroenterology

## 2021-12-04 VITALS — BP 122/67 | HR 83 | Temp 97.3°F | Resp 18 | Ht 59.0 in | Wt 168.0 lb

## 2021-12-04 DIAGNOSIS — K5140 Inflammatory polyps of colon without complications: Secondary | ICD-10-CM

## 2021-12-04 DIAGNOSIS — D125 Benign neoplasm of sigmoid colon: Secondary | ICD-10-CM

## 2021-12-04 DIAGNOSIS — D122 Benign neoplasm of ascending colon: Secondary | ICD-10-CM

## 2021-12-04 DIAGNOSIS — Z09 Encounter for follow-up examination after completed treatment for conditions other than malignant neoplasm: Secondary | ICD-10-CM | POA: Diagnosis not present

## 2021-12-04 DIAGNOSIS — Z86010 Personal history of colonic polyps: Secondary | ICD-10-CM | POA: Diagnosis present

## 2021-12-04 HISTORY — PX: COLONOSCOPY: SHX174

## 2021-12-04 MED ORDER — SODIUM CHLORIDE 0.9 % IV SOLN: 500.0000 mL | Freq: Once | INTRAVENOUS | Status: DC

## 2021-12-04 NOTE — Progress Notes (Signed)
PT taken to PACU. Monitors in place. VSS. Report given to RN. 

## 2021-12-04 NOTE — Progress Notes (Signed)
VS by DT    

## 2021-12-04 NOTE — Patient Instructions (Signed)
Please read handouts provided. Continue present medications. Await pathology results.   YOU HAD AN ENDOSCOPIC PROCEDURE TODAY AT THE [facility]:   Refer to the procedure report that was given to you for any specific questions about what was found during the examination.  If the procedure report does not answer your questions, please call your gastroenterologist to clarify.  If you requested that your care partner not be given the details of your procedure findings, then the procedure report has been included in a sealed envelope for you to review at your convenience later.  YOU SHOULD EXPECT: Some feelings of bloating in the abdomen. Passage of more gas than usual.  Walking can help get rid of the air that was put into your GI tract during the procedure and reduce the bloating. If you had a lower endoscopy (such as a colonoscopy or flexible sigmoidoscopy) you may notice spotting of blood in your stool or on the toilet paper. If you underwent a bowel prep for your procedure, you may not have a normal bowel movement for a few days.  Please Note:  You might notice some irritation and congestion in your nose or some drainage.  This is from the oxygen used during your procedure.  There is no need for concern and it should clear up in a day or so.  SYMPTOMS TO REPORT IMMEDIATELY:  Following lower endoscopy (colonoscopy or flexible sigmoidoscopy):  Excessive amounts of blood in the stool  Significant tenderness or worsening of abdominal pains  Swelling of the abdomen that is new, acute  Fever of 100F or higher   For urgent or emergent issues, a gastroenterologist can be reached at any hour by calling [phone]. Do not use MyChart messaging for urgent concerns.    DIET:  We do recommend a small meal at first, but then you may proceed to your regular diet.  Drink plenty of fluids but you should avoid alcoholic beverages for 24 hours.  ACTIVITY:  You should plan to take it easy  for the rest of today and you should NOT DRIVE or use heavy machinery until tomorrow (because of the sedation medicines used during the test).    FOLLOW UP: Our staff will call the number listed on your records 48-72 hours following your procedure to check on you and address any questions or concerns that you may have regarding the information given to you following your procedure. If we do not reach you, we will leave a message.  We will attempt to reach you two times.  During this call, we will ask if you have developed any symptoms of COVID 19. If you develop any symptoms (ie: fever, flu-like symptoms, shortness of breath, cough etc.) before then, please call [phone].  If you test positive for Covid 19 in the 2 weeks post procedure, please call and report this information to us.    If any biopsies were taken you will be contacted by phone or by letter within the next 1-3 weeks.  Please call us at [phone] if you have not heard about the biopsies in 3 weeks.    SIGNATURES/CONFIDENTIALITY: You and/or your care partner have signed paperwork which will be entered into your electronic medical record.  These signatures attest to the fact that that the information above on your After Visit Summary has been reviewed and is understood.  Full responsibility of the confidentiality of this discharge information lies with you and/or your care-partner.  

## 2021-12-04 NOTE — Progress Notes (Signed)
Called to room to assist during endoscopic procedure.  Patient ID and intended procedure confirmed with present staff. Received instructions for my participation in the procedure from the performing physician.  

## 2021-12-04 NOTE — Progress Notes (Signed)
[facility] History and Physical   Primary Care Physician:  Laurey Morale, MD   Reason for Procedure:   History of colon polyps  Plan:    Surveillance colonoscopy     HPI: Miranda Mcguire is a 76 y.o. female undergoing surveillance colonoscopy.  Her last colonoscopy was in 2018 and 5 polyps were removed (pathology results not available) and she was recommended to repeat colonoscopy in 5 years.  She has no family history of colon cancer and no chronic GI symptoms.  She has a history of AML s/p bone marrow transplant in 2018   Past Medical History:  Diagnosis Date   Allergy    AML (acute myeloid leukemia) ([place])    sees Dr. Phill Myron at [facility]    Anemia    nos   Cancer [facility])    Cataracts, bilateral    sees Dr. Baldemar Lenis    Endometrial polyp    post-menopausal bleeding   Hyperlipidemia    Hypertension    OA (osteoarthritis)    OP (osteoporosis)    last dexa 08-15-08. sees Dr. Renato Shin    Past Surgical History:  Procedure Laterality Date   BONE MARROW TRANSPLANT  08/2016   COLONOSCOPY  08/06/2016   at [facility], benign polyps, repeat in 5 yrs    DILATION AND CURETTAGE OF UTERUS     endometrial polypectomy     with D and C 03-08-09 pr dr Gardenia Phlegm   herniated disc     L4-5 per Dr Saintclair Halsted   TONSILECTOMY, ADENOIDECTOMY, [place]      Prior to Admission medications   Medication Sig Start Date End Date Taking? Authorizing Provider  amLODipine (NORVASC) 10 MG tablet Take 1 tablet (10 mg total) by mouth daily. 09/24/21  Yes Laurey Morale, MD  atorvastatin (LIPITOR) 20 MG tablet Take 1 tablet (20 mg total) by mouth daily. 09/24/21  Yes Laurey Morale, MD  B Complex-C (SUPER B COMPLEX PO) Take by mouth daily.   Yes [provider]  calcitRIOL (ROCALTROL) 0.25 MCG capsule TAKE ONE CAPSULE BY MOUTH THREE TIMES A WEEK 11/18/21  Yes Philemon Kingdom, MD  Calcium Carbonate-Vit D-Min (CALCIUM 1200 PO) Take 1 capsule by  mouth daily.   Yes [provider]  cholecalciferol (VITAMIN D3) 25 MCG (1000 UNIT) tablet Take 3 tablets (3,000 Units total) by mouth daily. 02/28/20  Yes Renato Shin, MD  levothyroxine (SYNTHROID) 50 MCG tablet Take 1 tablet (50 mcg total) by mouth daily. 09/24/21  Yes Laurey Morale, MD  loratadine (CLARITIN) 10 MG tablet Take 10 mg by mouth daily. allertec   Yes [provider]  Magnesium Oxide 250 MG TABS Take 1 tablet by mouth daily.    Yes [provider]  metoprolol tartrate (LOPRESSOR) 25 MG tablet Take 1 tablet (25 mg total) by mouth 2 (two) times daily with a meal. 09/24/21  Yes Laurey Morale, MD  Multiple Vitamin (MULTIVITAMIN) tablet Take 1 tablet by mouth daily.   Yes [provider]  Multiple Vitamins-Minerals (MACULAR HEALTH FORMULA PO) Take by mouth daily. Twice a day   Yes [provider]  ondansetron (ZOFRAN-ODT) 4 MG disintegrating tablet Take 1 tablet (4 mg total) by mouth every 8 (eight) hours as needed for nausea or vomiting. 09/05/21  Yes Daryel November, MD  Potassium Gluconate 550 (90 K) MG TABS Take 1 tablet by mouth daily. Every other day 07/12/18  Yes [provider]  denosumab (  PROLIA) 60 MG/ML SOSY injection Inject 60 mg into the skin every 6 (six) months.    [provider]  zoledronic acid (RECLAST) 5 MG/100ML SOLN injection Reclast 04/06/18   [provider]    Current Outpatient Medications  Medication Sig Dispense Refill   amLODipine (NORVASC) 10 MG tablet Take 1 tablet (10 mg total) by mouth daily. 90 tablet 3   atorvastatin (LIPITOR) 20 MG tablet Take 1 tablet (20 mg total) by mouth daily. 90 tablet 3   B Complex-C (SUPER B COMPLEX PO) Take by mouth daily.     calcitRIOL (ROCALTROL) 0.25 MCG capsule TAKE ONE CAPSULE BY MOUTH THREE TIMES A WEEK 39 capsule 1   Calcium Carbonate-Vit D-Min (CALCIUM 1200 PO) Take 1 capsule by mouth daily.     cholecalciferol (VITAMIN D3) 25 MCG (1000 UNIT)  tablet Take 3 tablets (3,000 Units total) by mouth daily. 90 tablet 11   levothyroxine (SYNTHROID) 50 MCG tablet Take 1 tablet (50 mcg total) by mouth daily. 90 tablet 3   loratadine (CLARITIN) 10 MG tablet Take 10 mg by mouth daily. allertec     Magnesium Oxide 250 MG TABS Take 1 tablet by mouth daily.      metoprolol tartrate (LOPRESSOR) 25 MG tablet Take 1 tablet (25 mg total) by mouth 2 (two) times daily with a meal. 180 tablet 3   Multiple Vitamin (MULTIVITAMIN) tablet Take 1 tablet by mouth daily.     Multiple Vitamins-Minerals (MACULAR HEALTH FORMULA PO) Take by mouth daily. Twice a day     ondansetron (ZOFRAN-ODT) 4 MG disintegrating tablet Take 1 tablet (4 mg total) by mouth every 8 (eight) hours as needed for nausea or vomiting. 4 tablet 0   Potassium Gluconate 550 (90 K) MG TABS Take 1 tablet by mouth daily. Every other day     denosumab (PROLIA) 60 MG/ML SOSY injection Inject 60 mg into the skin every 6 (six) months.     zoledronic acid (RECLAST) 5 MG/100ML SOLN injection Reclast     Current Facility-Administered Medications  Medication Dose Route Frequency Provider Last Rate Last Admin   0.9 %  sodium chloride infusion  500 mL Intravenous Once Daryel November, MD        Allergies as of 12/04/2021 - Review Complete 12/04/2021  Allergen Reaction Noted   Other  07/26/2019   Penicillins Other (See Comments) 04/06/2007   Prunus persica Hives 08/04/2016   Tape Dermatitis 08/04/2016   Aspirin Hives and Rash 04/06/2007    Family History  Problem Relation Age of Onset   Dementia Mother    Heart disease Father    Osteoporosis Sister    Stroke Other    Colon cancer Neg Hx    Esophageal cancer Neg Hx    Rectal cancer Neg Hx    Pancreatic cancer Neg Hx    Stomach cancer Neg Hx     Social History   Socioeconomic History   Marital status: Married    Spouse name: Miranda Mcguire   Number of children: 1   Years of education: Not on file   Highest education level: Not on file   Occupational History   Not on file  Tobacco Use   Smoking status: Former    Types: Cigarettes   Smokeless tobacco: Never  Vaping Use   Vaping Use: Never used  Substance and Sexual Activity   Alcohol use: No    Alcohol/week: 0.0 standard drinks   Drug use: No   Sexual activity: Not on  file  Other Topics Concern   Not on file  Social History Narrative   Not on file   Social Determinants of Health   Financial Resource Strain: Low Risk    Difficulty of Paying Living Expenses: Not hard at all  Food Insecurity: No Food Insecurity   Worried About Charity fundraiser in the Last Year: Never true   [place] in the Last Year: Never true  Transportation Needs: No Transportation Needs   Lack of Transportation (Medical): No   Lack of Transportation (Non-Medical): No  Physical Activity: Inactive   Days of Exercise per Week: 0 days   Minutes of Exercise per Session: 0 min  Stress: No Stress Concern Present   Feeling of Stress : Not at all  Social Connections: Socially Integrated   Frequency of Communication with Friends and Family: More than three times a week   Frequency of Social Gatherings with Friends and Family: Twice a week   Attends Religious Services: 1 to 4 times per year   Active Member of [employer] or Organizations: Yes   Attends Archivist Meetings: 1 to 4 times per year   Marital Status: Married  Human resources officer Violence: Not At Risk   Fear of Current or Ex-Partner: No   Emotionally Abused: No   Physically Abused: No   Sexually Abused: No    Review of Systems:  All other review of systems negative except as mentioned in the HPI.  Physical Exam: Vital signs BP 133/60   Pulse 100   Temp (!) 97.3 F (36.3 C)   Ht '4\' 11"'$  (1.499 m)   Wt 168 lb (76.2 kg)   SpO2 97%   BMI 33.93 kg/m   General:   Alert,  Well-developed, well-nourished, pleasant and cooperative in NAD Airway:  Mallampati 2 Lungs:  Clear throughout to auscultation.   Heart:   Regular rate and rhythm; no murmurs, clicks, rubs,  or gallops. Abdomen:  Soft, nontender and nondistended. Normal bowel sounds.   Neuro/Psych:  Normal mood and affect. A and O x 3   Safiyyah Vasconez E. Candis Schatz, MD [facility]

## 2021-12-04 NOTE — Op Note (Signed)
[place] Patient Name: Miranda Mcguire [facility] Procedure Date: 12/04/2021 7:56 AM MRN: [id number] Endoscopist: Nicki Reaper E. Candis Schatz , MD Age: 76 Referring MD:  Date of Birth: 03-25-1946 Gender: Female Account #: [id number] Procedure:                Colonoscopy Indications:              Surveillance: Personal history of colonic polyps                            (unknown histology) on last colonoscopy 5 years ago Medicines:                Monitored Anesthesia Care Procedure:                Pre-Anesthesia Assessment:                           - Prior to the procedure, a History and Physical                            was performed, and patient medications and                            allergies were reviewed. The patient's tolerance of                            previous anesthesia was also reviewed. The risks                            and benefits of the procedure and the sedation                            options and risks were discussed with the patient.                            All questions were answered, and informed consent                            was obtained. Prior Anticoagulants: The patient has                            taken no previous anticoagulant or antiplatelet                            agents. ASA Grade Assessment: II - A patient with                            mild systemic disease. After reviewing the risks                            and benefits, the patient was deemed in                            satisfactory condition to undergo the procedure.  After obtaining informed consent, the colonoscope                            was passed under direct vision. Throughout the                            procedure, the patient's blood pressure, pulse, and                            oxygen saturations were monitored continuously. The                            CF [id number] #[id number] was introduced through the anus                             and advanced to the the terminal ileum, with                            identification of the appendiceal orifice and IC                            valve. The colonoscopy was performed without                            difficulty. The patient tolerated the procedure                            well. The quality of the bowel preparation was                            excellent. The terminal ileum, ileocecal valve,                            appendiceal orifice, and rectum were photographed.                            The bowel preparation used was Sutab via split dose                            instruction. Scope In: 8:01:30 AM Scope Out: 8:15:06 AM Scope Withdrawal Time: 0 hours 11 minutes 18 seconds  Total Procedure Duration: 0 hours 13 minutes 36 seconds  Findings:                 Skin tags were found on perianal exam.                           The digital rectal exam was normal. Pertinent                            negatives include normal sphincter tone and no                            palpable rectal lesions.  A 2 mm polyp was found in the ascending colon. The                            polyp was sessile. The polyp was removed with a                            cold biopsy forceps. Resection and retrieval were                            complete. Estimated blood loss was minimal.                           A 3 mm polyp was found in the sigmoid colon. The                            polyp was sessile. The polyp was removed with a                            cold biopsy forceps. Resection and retrieval were                            complete. Estimated blood loss was minimal.                           A 4 mm polyp was found in the sigmoid colon. The                            polyp was sessile. The polyp was removed with a                            cold snare. Resection and retrieval were complete.                            Estimated blood loss was  minimal.                           A few small-mouthed diverticula were found in the                            sigmoid colon.                           The exam was otherwise normal throughout the                            examined colon.                           The terminal ileum appeared normal.                           The retroflexed view of the distal rectum and anal  verge was normal and showed no anal or rectal                            abnormalities. Complications:            No immediate complications. Estimated Blood Loss:     Estimated blood loss was minimal. Impression:               - Perianal skin tags found on perianal exam.                           - One 2 mm polyp in the ascending colon, removed                            with a cold biopsy forceps. Resected and retrieved.                           - One 3 mm polyp in the sigmoid colon, removed with                            a cold biopsy forceps. Resected and retrieved.                           - One 4 mm polyp in the sigmoid colon, removed with                            a cold snare. Resected and retrieved.                           - Diverticulosis in the sigmoid colon.                           - The examined portion of the ileum was normal.                           - The distal rectum and anal verge are normal on                            retroflexion view. Recommendation:           - Patient has a contact number available for                            emergencies. The signs and symptoms of potential                            delayed complications were discussed with the                            patient. Return to normal activities tomorrow.                            Written discharge instructions were provided to the  patient.                           - Resume previous diet.                           - Continue present medications.                            - Await pathology results.                           - Repeat colonoscopy (date not yet determined) for                            surveillance based on pathology results. Kayla Weekes E. Candis Schatz, MD 12/04/2021 8:21:19 AM This report has been signed electronically.

## 2021-12-05 ENCOUNTER — Telehealth: Payer: Self-pay | Admitting: *Deleted

## 2021-12-05 NOTE — Telephone Encounter (Signed)
  Follow up Call-     12/04/2021    7:11 AM  Call back number  Post procedure Call Back phone  # [phone]  Permission to leave phone message Yes     Patient questions:  Do you have a fever, pain , or abdominal swelling? No. Pain Score  0 *  Have you tolerated food without any problems? Yes.    Have you been able to return to your normal activities? Yes.    Do you have any questions about your discharge instructions: Diet   No. Medications  No. Follow up visit  No.  Do you have questions or concerns about your Care? No.  Actions: * If pain score is 4 or above: No action needed, pain <4.

## 2021-12-10 ENCOUNTER — Encounter: Payer: Self-pay | Admitting: Gastroenterology

## 2021-12-12 ENCOUNTER — Encounter: Payer: Self-pay | Admitting: Gastroenterology

## 2021-12-12 NOTE — Progress Notes (Signed)
Ms. Exantus, Good news: the polyp (or polyps) that I removed during your recent examination were NOT precancerous.  Given your age and lack of concerning polyps, I recommend against further colon cancer screening.  If you develop any new rectal bleeding, abdominal pain or significant bowel habit changes, please contact me before then.

## 2021-12-23 ENCOUNTER — Ambulatory Visit (INDEPENDENT_AMBULATORY_CARE_PROVIDER_SITE_OTHER): Payer: Medicare HMO

## 2021-12-23 VITALS — BP 113/60 | HR 68 | Temp 97.9°F | Resp 18 | Ht 59.0 in | Wt 166.2 lb

## 2021-12-23 DIAGNOSIS — M810 Age-related osteoporosis without current pathological fracture: Secondary | ICD-10-CM | POA: Diagnosis not present

## 2021-12-23 MED ORDER — SODIUM CHLORIDE 0.9 % IV SOLN: INTRAVENOUS | Status: DC

## 2021-12-23 MED ORDER — ACETAMINOPHEN 325 MG PO TABS: 650.0000 mg | ORAL_TABLET | Freq: Once | ORAL | Status: DC

## 2021-12-23 MED ORDER — ZOLEDRONIC ACID 5 MG/100ML IV SOLN
5.0000 mg | Freq: Once | INTRAVENOUS | Status: AC
  Administered 2021-12-23: 5 mg via INTRAVENOUS
  Filled 2021-12-23: qty 100

## 2021-12-23 MED ORDER — DIPHENHYDRAMINE HCL 25 MG PO CAPS: 25.0000 mg | ORAL_CAPSULE | Freq: Once | ORAL | Status: DC

## 2021-12-23 NOTE — Progress Notes (Signed)
Diagnosis: Osteoporosis  Provider:  Marshell Garfinkel, MD  Procedure: Infusion  IV Type: Peripheral, IV Location: L Antecubital  Reclast (Zolendronic Acid), Dose: 5 mg  Infusion Start Time: 0910  Infusion Stop Time: 0945  Post Infusion IV Care: Peripheral IV Discontinued  Discharge: Condition: Good, Destination: Home . AVS provided to patient.   Performed by:  Koren Shiver, RN

## 2021-12-28 ENCOUNTER — Encounter: Payer: Self-pay | Admitting: Family Medicine

## 2022-01-02 ENCOUNTER — Other Ambulatory Visit: Payer: Self-pay | Admitting: Family Medicine

## 2022-01-02 DIAGNOSIS — Z1231 Encounter for screening mammogram for malignant neoplasm of breast: Secondary | ICD-10-CM

## 2022-01-04 ENCOUNTER — Encounter: Payer: Self-pay | Admitting: Family Medicine

## 2022-01-06 NOTE — Telephone Encounter (Signed)
Yes it is recommended that everyone over the age of 55 should get the latest Covid booster

## 2022-01-10 ENCOUNTER — Ambulatory Visit (INDEPENDENT_AMBULATORY_CARE_PROVIDER_SITE_OTHER): Payer: Medicare HMO

## 2022-01-10 VITALS — BP 122/58 | HR 76 | Temp 97.5°F | Ht 59.0 in | Wt 168.0 lb

## 2022-01-10 DIAGNOSIS — Z0000 Encounter for general adult medical examination without abnormal findings: Secondary | ICD-10-CM | POA: Diagnosis not present

## 2022-01-10 NOTE — Patient Instructions (Addendum)
Miranda Mcguire , Thank you for taking time to come for your Medicare Wellness Visit. I appreciate your ongoing commitment to your health goals. Please review the following plan we discussed and let me know if I can assist you in the future.   These are the goals we discussed:  Goals       Patient Stated      Maintain healthy      Pharmacy Care Plan      CARE PLAN ENTRY  Current Barriers:  Chronic Disease Management support, education, and care coordination needs related to Hypertension, Hyperlipidemia, and Hypothyroidism, Osteoporosis, Osteoarthritis, Allergic rhinitis  Hypertension Current antihypertensive regimen:  amlodipine '10mg'$ , 1 tablt once daily  metoprolol tartrate '25mg'$ , 1 tablet twice daily  Previous antihypertensives tried: none Last practice recorded BP readings:  BP Readings from Last 3 Encounters:  09/20/19 (!) 142/70  07/26/19 124/68  02/28/19 124/60  Current home BP readings: does not check at home  Hyperlipidemia Current antihyperlipidemic regimen: atorvastatin '20mg'$ , 1 tablet once daily  Previous antihyperlipidemic medications tried none Most recent lipid panel:     Component Value Date/Time   CHOL 207 (H) 09/20/2019 0931   TRIG 247.0 (H) 09/20/2019 0931   HDL 57.40 09/20/2019 0931   CHOLHDL 4 09/20/2019 0931   VLDL 49.4 (H) 09/20/2019 0931   LDLCALC 116 (H) 02/07/2019 0826   LDLDIRECT 101.0 09/20/2019 0931  The 10-year ASCVD risk score Mikey Bussing DC Jr., et al., 2013) is: 22.6%  Hypothyroidism Current regimen: levothyroxine 57mg, 1 tablet once daily  TSH  Date Value Ref Range Status  09/20/2019 2.38 0.35 - 4.50 uIU/mL Final  Osteoporosis Current regimen:  Prolia '60mg'$  injection every 6 months Reclast IV, every year calcitriol 0.238m, 1 tablet 3 days per week vitamin D, 100081m 1 tablet daily  calcium '1200mg'$  1 tablet twice daily   Allergic rhinitis Current regimen:  loratadine '10mg'$ , 1 tablet once daily   Osteoarthritis Current regimen: diclofenac  '75mg'$ , 1 tablet twice daily    Pharmacist Clinical Goal(s):  Hypertension Over the next 180 days, patient will maintain blood pressure <140/90 mmHg. Hyperlipidemia Over the next 180 days, patient will lower ASCVD risk by continuing statin medication and lifestyle modifications (diet/ exercise).  Hypothyroidism Over the next 180 days, patient will maintain TSH between 0.45 to 4.5uIU/ml. Osteoporosis Over the next 180 days, patient will minimize risk of fractures by continuing appointments for Reclast infusions and Prolia injections. Osteoarthritis Over the next 90 days, Patient will minimize pain levels by continuing current regimen and obtain steroid injections every 3 months as needed.  Interventions: Comprehensive medication review performed. Hypertension Recommend checking blood pressure 1 to 2 times per week.  Discussed diet modifications. DASH diet:  following a diet emphasizing fruits and vegetables and low-fat dairy products along with whole grains, fish, poultry, and nuts. Reducing red meats and sugars.  Discussed the importance of checking blood pressure at home. Hyperlipidemia We discussed how a diet high in plant sterols (fruits/vegetables/nuts/whole grains/legumes) may reduce your cholesterol.  Encouraged increasing fiber to a daily intake of 10-25g/day   Patient Self Care Activities:  Patient verbalizes understanding of plan as described above, Self administers medications as prescribed, Calls pharmacy for medication refills, and Calls provider office for new concerns or questions Over next 180 days, patient will: Continue current medications as directed by providers.  Continue following up with primary care provider and/or specialists.  Please see past updates related to this goal by clicking on the "Past Updates" button in the selected goal  Stay healthy (pt-stated)        This is a list of the screening recommended for you and due dates:  Health Maintenance   Topic Date Due   Flu Shot  02/04/2022   Tetanus Vaccine  06/10/2027   Pneumonia Vaccine  Completed   DEXA scan (bone density measurement)  Completed   COVID-19 Vaccine  Completed   Hepatitis C Screening: USPSTF Recommendation to screen - Ages 47-79 yo.  Completed   Zoster (Shingles) Vaccine  Completed   HPV Vaccine  Aged Out   Colon Cancer Screening  Discontinued   Advanced directives: Yes  Conditions/risks identified: None  Next appointment: Follow up in one year for your annual wellness visit     Preventive Care 65 Years and Older, Female Preventive care refers to lifestyle choices and visits with your health care provider that can promote health and wellness. What does preventive care include? A yearly physical exam. This is also called an annual well check. Dental exams once or twice a year. Routine eye exams. Ask your health care provider how often you should have your eyes checked. Personal lifestyle choices, including: Daily care of your teeth and gums. Regular physical activity. Eating a healthy diet. Avoiding tobacco and drug use. Limiting alcohol use. Practicing safe sex. Taking low-dose aspirin every day. Taking vitamin and mineral supplements as recommended by your health care provider. What happens during an annual well check? The services and screenings done by your health care provider during your annual well check will depend on your age, overall health, lifestyle risk factors, and family history of disease. Counseling  Your health care provider may ask you questions about your: Alcohol use. Tobacco use. Drug use. Emotional well-being. Home and relationship well-being. Sexual activity. Eating habits. History of falls. Memory and ability to understand (cognition). Work and work Statistician. Reproductive health. Screening  You may have the following tests or measurements: Height, weight, and BMI. Blood pressure. Lipid and cholesterol levels. These  may be checked every 5 years, or more frequently if you are over 79 years old. Skin check. Lung cancer screening. You may have this screening every year starting at age 58 if you have a 30-pack-year history of smoking and currently smoke or have quit within the past 15 years. Fecal occult blood test (FOBT) of the stool. You may have this test every year starting at age 79. Flexible sigmoidoscopy or colonoscopy. You may have a sigmoidoscopy every 5 years or a colonoscopy every 10 years starting at age 33. Hepatitis C blood test. Hepatitis B blood test. Sexually transmitted disease (STD) testing. Diabetes screening. This is done by checking your blood sugar (glucose) after you have not eaten for a while (fasting). You may have this done every 1-3 years. Bone density scan. This is done to screen for osteoporosis. You may have this done starting at age 52. Mammogram. This may be done every 1-2 years. Talk to your health care provider about how often you should have regular mammograms. Talk with your health care provider about your test results, treatment options, and if necessary, the need for more tests. Vaccines  Your health care provider may recommend certain vaccines, such as: Influenza vaccine. This is recommended every year. Tetanus, diphtheria, and acellular pertussis (Tdap, Td) vaccine. You may need a Td booster every 10 years. Zoster vaccine. You may need this after age 65. Pneumococcal 13-valent conjugate (PCV13) vaccine. One dose is recommended after age 27. Pneumococcal polysaccharide (PPSV23) vaccine. One dose is recommended after  age 52. Talk to your health care provider about which screenings and vaccines you need and how often you need them. This information is not intended to replace advice given to you by your health care provider. Make sure you discuss any questions you have with your health care provider. Document Released: 07/20/2015 Document Revised: 03/12/2016 Document  Reviewed: 04/24/2015 Elsevier Interactive Patient Education  2017 [place] Prevention in the Home Falls can cause injuries. They can happen to people of all ages. There are many things you can do to make your home safe and to help prevent falls. What can I do on the outside of my home? Regularly fix the edges of walkways and driveways and fix any cracks. Remove anything that might make you trip as you walk through a door, such as a raised step or threshold. Trim any bushes or trees on the path to your home. Use bright outdoor lighting. Clear any walking paths of anything that might make someone trip, such as rocks or tools. Regularly check to see if handrails are loose or broken. Make sure that both sides of any steps have handrails. Any raised decks and porches should have guardrails on the edges. Have any leaves, snow, or ice cleared regularly. Use sand or salt on walking paths during winter. Clean up any spills in your garage right away. This includes oil or grease spills. What can I do in the bathroom? Use night lights. Install grab bars by the toilet and in the tub and shower. Do not use towel bars as grab bars. Use non-skid mats or decals in the tub or shower. If you need to sit down in the shower, use a plastic, non-slip stool. Keep the floor dry. Clean up any water that spills on the floor as soon as it happens. Remove soap buildup in the tub or shower regularly. Attach bath mats securely with double-sided non-slip rug tape. Do not have throw rugs and other things on the floor that can make you trip. What can I do in the bedroom? Use night lights. Make sure that you have a light by your bed that is easy to reach. Do not use any sheets or blankets that are too big for your bed. They should not hang down onto the floor. Have a firm chair that has side arms. You can use this for support while you get dressed. Do not have throw rugs and other things on the floor that can  make you trip. What can I do in the kitchen? Clean up any spills right away. Avoid walking on wet floors. Keep items that you use a lot in easy-to-reach places. If you need to reach something above you, use a strong step stool that has a grab bar. Keep electrical cords out of the way. Do not use floor polish or wax that makes floors slippery. If you must use wax, use non-skid floor wax. Do not have throw rugs and other things on the floor that can make you trip. What can I do with my stairs? Do not leave any items on the stairs. Make sure that there are handrails on both sides of the stairs and use them. Fix handrails that are broken or loose. Make sure that handrails are as long as the stairways. Check any carpeting to make sure that it is firmly attached to the stairs. Fix any carpet that is loose or worn. Avoid having throw rugs at the top or bottom of the stairs. If you do  have throw rugs, attach them to the floor with carpet tape. Make sure that you have a light switch at the top of the stairs and the bottom of the stairs. If you do not have them, ask someone to add them for you. What else can I do to help prevent falls? Wear shoes that: Do not have high heels. Have rubber bottoms. Are comfortable and fit you well. Are closed at the toe. Do not wear sandals. If you use a stepladder: Make sure that it is fully opened. Do not climb a closed stepladder. Make sure that both sides of the stepladder are locked into place. Ask someone to hold it for you, if possible. Clearly mark and make sure that you can see: Any grab bars or handrails. First and last steps. Where the edge of each step is. Use tools that help you move around (mobility aids) if they are needed. These include: Canes. Walkers. Scooters. Crutches. Turn on the lights when you go into a dark area. Replace any light bulbs as soon as they burn out. Set up your furniture so you have a clear path. Avoid moving your furniture  around. If any of your floors are uneven, fix them. If there are any pets around you, be aware of where they are. Review your medicines with your doctor. Some medicines can make you feel dizzy. This can increase your chance of falling. Ask your doctor what other things that you can do to help prevent falls. This information is not intended to replace advice given to you by your health care provider. Make sure you discuss any questions you have with your health care provider. Document Released: 04/19/2009 Document Revised: 11/29/2015 Document Reviewed: 07/28/2014 Elsevier Interactive Patient Education  2017 [employer].

## 2022-01-10 NOTE — Progress Notes (Signed)
Subjective:   Miranda Mcguire is a 76 y.o. female who presents for Medicare Annual (Subsequent) preventive examination.  Review of Systems     Cardiac Risk Factors include: advanced age (>22mn, >>33women);hypertension     Objective:    Today's Vitals   01/10/22 0856  BP: (!) 122/58  Pulse: 76  Temp: (!) 97.5 F (36.4 C)  SpO2: 94%  Weight: 168 lb (76.2 kg)  Height: '4\' 11"'  (1.499 m)   Body mass index is 33.93 kg/m.     01/10/2022    9:12 AM 01/09/2021   10:31 AM 05/03/2015    8:51 AM 03/19/2015    9:33 AM 01/24/2015   10:25 AM 11/13/2014    4:16 PM  Advanced Directives  Does Patient Have a Medical Advance Directive? Yes Yes Yes Yes  Yes  Type of AParamed[place]of [place] will Living will [place]Living will  [place] will Living will  Does patient want to make changes to medical advance directive? No - Patient declined       Copy of [name] Chart? Yes - validated most recent copy scanned in chart (See row information)  Yes Yes No - copy requested No - copy requested    Current Medications (verified) Outpatient Encounter Medications as of 01/10/2022  Medication Sig   amLODipine (NORVASC) 10 MG tablet Take 1 tablet (10 mg total) by mouth daily.   atorvastatin (LIPITOR) 20 MG tablet Take 1 tablet (20 mg total) by mouth daily.   B Complex-C (SUPER B COMPLEX PO) Take by mouth daily.   calcitRIOL (ROCALTROL) 0.25 MCG capsule TAKE ONE CAPSULE BY MOUTH THREE TIMES A WEEK   Calcium Carbonate-Vit D-Min (CALCIUM 1200 PO) Take 1 capsule by mouth daily.   cholecalciferol (VITAMIN D3) 25 MCG (1000 UNIT) tablet Take 3 tablets (3,000 Units total) by mouth daily.   denosumab (PROLIA) 60 MG/ML SOSY injection Inject 60 mg into the skin every 6 (six) months.   levothyroxine (SYNTHROID) 50 MCG tablet Take 1 tablet (50 mcg total) by mouth daily.   loratadine (CLARITIN) 10 MG tablet Take  10 mg by mouth daily. allertec   Magnesium Oxide 250 MG TABS Take 1 tablet by mouth daily.    metoprolol tartrate (LOPRESSOR) 25 MG tablet Take 1 tablet (25 mg total) by mouth 2 (two) times daily with a meal.   Multiple Vitamin (MULTIVITAMIN) tablet Take 1 tablet by mouth daily.   Multiple Vitamins-Minerals (MACULAR HEALTH FORMULA PO) Take by mouth daily. Twice a day   ondansetron (ZOFRAN-ODT) 4 MG disintegrating tablet Take 1 tablet (4 mg total) by mouth every 8 (eight) hours as needed for nausea or vomiting.   Potassium Gluconate 550 (90 K) MG TABS Take 1 tablet by mouth daily. Every other day   zoledronic acid (RECLAST) 5 MG/100ML SOLN injection Reclast   No facility-administered encounter medications on file as of 01/10/2022.    Allergies (verified) Other, Penicillins, Prunus persica, Tape, and Aspirin   History: Past Medical History:  Diagnosis Date   Allergy    [place]    [facility]   Anemia    nos   Cancer ([facility]    Cataracts, bilateral    sees Dr. KBaldemar Lenis   Endometrial polyp    post-menopausal bleeding   Hyperlipidemia    Hypertension    OA (osteoarthritis)    OP (osteoporosis)  last dexa 08-15-08. sees Dr. 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   COLONOSCOPY  08/06/2016   at [facility], benign polyps, repeat in 5 yrs    DILATION AND CURETTAGE OF UTERUS     endometrial polypectomy     with D and C 03-08-09 pr dr Gardenia Phlegm   herniated disc     L4-5 per Dr Saintclair Halsted   TONSILECTOMY, ADENOIDECTOMY, BILATERAL MYRINGOTOMY AND TUBES     Family History  Problem Relation Age of Onset   Dementia Mother    Heart disease Father    Osteoporosis Sister    Stroke Other    Colon cancer Neg Hx    Esophageal cancer Neg Hx    Rectal cancer Neg Hx    Pancreatic cancer Neg Hx    Stomach cancer Neg Hx    Social History   Socioeconomic History   Marital status:  Married    Spouse name: Miranda Mcguire   Number of children: 1   Years of education: Not on file   Highest education level: Not on file  Occupational History   Not on file  Tobacco Use   Smoking status: Former    Types: Cigarettes   Smokeless tobacco: Never  Vaping Use   Vaping Use: Never used  Substance and Sexual Activity   Alcohol use: No    Alcohol/week: 0.0 standard drinks of alcohol   Drug use: No   Sexual activity: Not on file  Other Topics Concern   Not on file  Social History Narrative   Not on file   Social Determinants of Health   Financial Resource Strain: Low Risk  (01/10/2022)   Overall Financial Resource Strain (CARDIA)    Difficulty of Paying Living Expenses: Not hard at all  Food Insecurity: No Food Insecurity (01/10/2022)   Hunger Vital Sign    Worried About Running Out of Food in the Last Year: Never true    Ran Out of Food in the Last Year: Never true  Transportation Needs: No Transportation Needs (01/10/2022)   PRAPARE - Hydrologist (Medical): No    Lack of Transportation (Non-Medical): No  Physical Activity: Inactive (01/10/2022)   Exercise Vital Sign    Days of Exercise per Week: 0 days    Minutes of Exercise per Session: 0 min  Stress: No Stress Concern Present (01/10/2022)   [place]    Feeling of Stress : Not at all  Social Connections: [place] (01/10/2022)   Social Connection and Isolation Panel [NHANES]    Frequency of Communication with Friends and Family: More than three times a week    Frequency of Social Gatherings with Friends and Family: More than three times a week    Attends Religious Services: More than 4 times per year    Active Member of [employer] or Organizations: Yes    Attends Music therapist: More than 4 times per year    Marital Status: Married    Tobacco Counseling Counseling given: Not Answered   Clinical  Intake:   Diabetic?  No  Interpreter Needed?: NoActivities of Daily Living    01/10/2022    9:10 AM  In your present state of health, do you have any difficulty performing the following activities:  Hearing? 0  Vision? 0  Difficulty concentrating or making decisions? 0  Walking or climbing stairs? 0  Dressing  or bathing? 0  Doing errands, shopping? 0  Preparing Food and eating ? N  Using the Toilet? N  In the past six months, have you accidently leaked urine? N  Do you have problems with loss of bowel control? N  Managing your Medications? N  Managing your Finances? N  Housekeeping or managing your Housekeeping? N    Patient Care Team: Laurey Morale, MD as PCP - General Luci Bank, MD as Referring Physician (Oncology) Truitt Merle, MD as Consulting Physician (Hematology) Viona Gilmore, [facility] as Pharmacist (Pharmacist)  Indicate any recent Medical Services you may have received from other than [facility] providers in the past year (date may be approximate).     Assessment:   This is a routine wellness examination for Miranda Mcguire.  Hearing/Vision screen Hearing Screening - Comments:: No hearing difficulty Vision Screening - Comments:: Wears reading glasses. Followed by Dr Herbert Deaner  Dietary issues and exercise activities discussed: Exercise limited by: None identified   Goals Addressed               This Visit's Progress     Stay healthy (pt-stated)         Depression Screen    01/10/2022    9:09 AM 01/09/2021   10:30 AM 09/20/2019    9:02 AM 08/16/2018    8:09 AM 07/06/2015   10:44 AM 07/04/2014    1:47 PM  PHQ 2/9 Scores  PHQ - 2 Score 0 0 0 0 0 0  PHQ- 9 Score   0       Fall Risk    01/10/2022    9:11 AM 01/09/2021   10:32 AM 09/20/2019    9:04 AM 08/16/2018    8:09 AM 07/06/2015   10:44 AM  Fall Risk   Falls in the past year? 0 0 0 0 No  Number falls in past yr: 0 0     Injury with Fall? 0 0     Risk for fall due to : No Fall Risks Impaired  balance/gait;Impaired vision;Impaired mobility     Risk for fall due to: Comment  when  back is in pain     Follow up  Falls prevention discussed       [place]:  Any stairs in or around the home? Yes  If so, are there any without handrails? No  Home free of loose throw rugs in walkways, pet beds, electrical cords, etc? Yes  Adequate lighting in your home to reduce risk of falls? Yes   ASSISTIVE DEVICES UTILIZED TO PREVENT FALLS:  Life alert? No  Use of a cane, walker or w/c? No  Grab bars in the bathroom? No  Shower chair or bench in shower? No  Elevated toilet seat or a handicapped toilet? No   TIMED UP AND GO:  Was the test performed? Yes .  Length of time to ambulate 10 feet: 5 sec.   Gait steady and fast without use of assistive device  Cognitive Function:        01/10/2022    9:12 AM 01/09/2021   10:35 AM  6CIT Screen  What Year? 0 points 0 points  What month? 0 points 0 points  What time? 0 points 0 points  Count back from 20 0 points 0 points  Months in reverse 0 points 0 points  Repeat phrase 0 points 0 points  Total Score 0 points 0 points    Immunizations Immunization History  Administered Date(s) Administered   DTaP / IPV 02/23/2017, 04/20/2017, 06/08/2017   Fluad Quad(high Dose 65+) 04/26/2019, 04/23/2020   Hepatitis B, ped/adol 02/23/2017, 04/20/2017, 08/17/2017   HiB (PRP-OMP) 02/23/2017, 04/20/2017, 06/08/2017   Influenza Split 04/12/2012, 03/24/2013, 04/03/2014, 06/13/2015, 03/16/2018   Influenza Whole 04/17/2008, 04/10/2009   Influenza, High Dose Seasonal PF 06/13/2015, 05/19/2016, 04/20/2017, 03/16/2018   Influenza-Unspecified 04/03/2014, 06/13/2015, 04/13/2021   MMR 03/10/2019   Moderna Covid-19 Vaccine Bivalent Booster 62yr & up 03/02/2021   Moderna Sars-Covid-2 Vaccination 07/18/2019, 08/15/2019, 02/24/2020, 10/15/2020, 03/22/2021   Pneumococcal Conjugate-13 07/24/2014, 02/23/2017, 04/20/2017, 06/08/2017    Pneumococcal Polysaccharide-23 05/20/2010   Tdap 06/09/2017   Zoster Recombinat (Shingrix) 03/22/2018, 03/24/2018, 05/31/2018, 05/31/2018   Zoster, Live 07/04/2014    TDAP status: Up to date  Flu Vaccine status: Up to date  Pneumococcal vaccine status: Up to date  Covid-19 vaccine status: Completed vaccines  Qualifies for Shingles Vaccine? Yes   Zostavax completed Yes   Shingrix Completed?: Yes  Screening Tests Health Maintenance  Topic Date Due   INFLUENZA VACCINE  02/04/2022   TETANUS/TDAP  06/10/2027   Pneumonia Vaccine 76 Years old  Completed   DEXA SCAN  Completed   COVID-19 Vaccine  Completed   Hepatitis C Screening  Completed   Zoster Vaccines- Shingrix  Completed   HPV VACCINES  Aged Out   COLONOSCOPY (Pts 45-497yrInsurance coverage will need to be confirmed)  Discontinued    Health Maintenance  There are no preventive care reminders to display for this patient.  Colorectal cancer screening: No longer required.   Mammogram status: No longer required due to Age.    Lung Cancer Screening: (Low Dose CT Chest recommended if Age 78-80ears, 30 pack-year currently smoking OR have quit w/in 15years.) does not qualify.     Additional Screening:  Hepatitis C Screening: does qualify; Completed 07/06/16  Vision Screening: Recommended annual ophthalmology exams for early detection of glaucoma and other disorders of the eye. Is the patient up to date with their annual eye exam?  Yes  Who is the provider or what is the name of the office in which the patient attends annual eye exams? Dr HeHerbert Deanerf pt is not established with a provider, would they like to be referred to a provider to establish care? No .   Dental Screening: Recommended annual dental exams for proper oral hygiene  Community Resource Referral / Chronic Care Management:   CRR required this visit?  No   CCM required this visit?  No      Plan:     I have personally reviewed and noted the  following in the patient's chart:   Medical and social history Use of alcohol, tobacco or illicit drugs  Current medications and supplements including opioid prescriptions.  Functional ability and status Nutritional status Physical activity Advanced directives List of other physicians Hospitalizations, surgeries, and ER visits in previous 12 months Vitals Screenings to include cognitive, depression, and falls Referrals and appointments  In addition, I have reviewed and discussed with patient certain preventive protocols, quality metrics, and best practice recommendations. A written personalized care plan for preventive services as well as general preventive health recommendations were provided to patient.     BeCriselda PeachesLPN   7/01/13/239 Nurse Notes: None

## 2022-01-22 ENCOUNTER — Ambulatory Visit: Payer: Medicare Other

## 2022-02-11 ENCOUNTER — Encounter: Payer: Self-pay | Admitting: Internal Medicine

## 2022-02-11 ENCOUNTER — Ambulatory Visit (INDEPENDENT_AMBULATORY_CARE_PROVIDER_SITE_OTHER): Payer: Medicare HMO | Admitting: Internal Medicine

## 2022-02-11 VITALS — BP 104/68 | HR 83 | Ht 59.0 in | Wt 168.0 lb

## 2022-02-11 DIAGNOSIS — E211 Secondary hyperparathyroidism, not elsewhere classified: Secondary | ICD-10-CM

## 2022-02-11 DIAGNOSIS — E559 Vitamin D deficiency, unspecified: Secondary | ICD-10-CM | POA: Diagnosis not present

## 2022-02-11 DIAGNOSIS — M810 Age-related osteoporosis without current pathological fracture: Secondary | ICD-10-CM | POA: Diagnosis not present

## 2022-02-11 LAB — VITAMIN D 25 HYDROXY (VIT D DEFICIENCY, FRACTURES): VITD: 39.95 ng/mL (ref 30.00–100.00)

## 2022-02-11 NOTE — Patient Instructions (Addendum)
Please call and schedule bone density scan at the [facility]: [phone].   Try to stop the Calcium-vitamin D pill.  Please stop Reclast.  Please stop at the lab.  You should have an endocrinology follow-up appointment in 1 year.  How Can I Prevent Falls? Men and women with osteoporosis need to take care not to fall down. Falls can break bones. Some reasons people fall are: Poor vision  Poor balance  Certain diseases that affect how you walk  Some types of medicine, such as sleeping pills.  Some tips to help prevent falls outdoors are: Use a cane or walker  Wear rubber-soled shoes so you don't slip  Walk on grass when sidewalks are slippery  In winter, put salt or kitty litter on icy sidewalks.  Some ways to help prevent falls indoors are: Keep rooms free of clutter, especially on floors  Use plastic or carpet runners on slippery floors  Wear low-heeled shoes that provide good support  Do not walk in socks, stockings, or slippers  Be sure carpets and area rugs have skid-proof backs or are tacked to the floor  Be sure stairs are well lit and have rails on both sides  Put grab bars on bathroom walls near tub, shower, and toilet  Use a rubber bath mat in the shower or tub  Keep a flashlight next to your bed  Use a sturdy step stool with a handrail and wide steps  Add more lights in rooms (and night lights) Buy a cordless phone to keep with you so that you don't have to rush to the phone       when it rings and so that you can call for help if you fall.   (adapted from http://www.niams.NightlifePreviews.se)   Exercise for Strong Bones (from [place]) There are two types of exercises that are important for building and maintaining bone density:  weight-bearing and muscle-strengthening exercises. Weight-bearing Exercises These exercises include activities that make you move against gravity while staying  upright. Weight-bearing exercises can be high-impact or low-impact. High-impact weight-bearing exercises help build bones and keep them strong. If you have broken a bone due to osteoporosis or are at risk of breaking a bone, you may need to avoid high-impact exercises. If you're not sure, you should check with your healthcare provider. Examples of high-impact weight-bearing exercises are: Dancing Doing high-impact aerobics Hiking Jogging/running Jumping Rope Stair climbing Tennis Low-impact weight-bearing exercises can also help keep bones strong and are a safe alternative if you cannot do high-impact exercises. Examples of low-impact weight-bearing exercises are: Using elliptical training machines Doing low-impact aerobics Using stair-step machines Fast walking on a treadmill or outside Muscle-Strengthening Exercises These exercises include activities where you move your body, a weight or some other resistance against gravity. They are also known as resistance exercises and include: Lifting weights Using elastic exercise bands Using weight machines Lifting your own body weight Functional movements, such as standing and rising up on your toes Yoga and Pilates can also improve strength, balance and flexibility. However, certain positions may not be safe for people with osteoporosis or those at increased risk of broken bones. For example, exercises that have you bend forward may increase the chance of breaking a bone in the spine. A physical therapist should be able to help you learn which exercises are safe and appropriate for you. Non-Impact Exercises Non-impact exercises can help you to improve balance, posture and how well you move in everyday activities. These exercises can  also help to increase muscle strength and decrease the risk of falls and broken bones. Some of these exercises include: Balance exercises that strengthen your legs and test your balance, such as Tai Chi, can decrease  your risk of falls. Posture exercises that improve your posture and reduce rounded or "sloping" shoulders can help you decrease the chance of breaking a bone, especially in the spine. Functional exercises that improve how well you move can help you with everyday activities and decrease your chance of falling and breaking a bone. For example, if you have trouble getting up from a chair or climbing stairs, you should do these activities as exercises. A physical therapist can teach you balance, posture and functional exercises. Starting a New Exercise Program If you haven't exercised regularly for a while, check with your healthcare provider before beginning a new exercise program--particularly if you have health problems such as heart disease, diabetes or high blood pressure. If you're at high risk of breaking a bone, you should work with a physical therapist to develop a safe exercise program. Once you have your healthcare provider's approval, start slowly. If you've already broken bones in the spine because of osteoporosis, be very careful to avoid activities that require reaching down, bending forward, rapid twisting motions, heavy lifting and those that increase your chance of a fall. As you get started, your muscles may feel sore for a day or two after you exercise. If soreness lasts longer, you may be working too hard and need to ease up. Exercises should be done in a pain-free range of motion. How Much Exercise Do You Need? Weight-bearing exercises 30 minutes on most days of the week. Do a 30-minutesession or multiple sessions spread out throughout the day. The benefits to your bones are the same.   Muscle-strengthening exercises Two to three days per week. If you don't have much time for strengthening/resistance training, do small amounts at a time. You can do just one body part each day. For example do arms one day, legs the next and trunk the next. You can also spread these exercises out during  your normal day.  Balance, posture and functional exercises Every day or as often as needed. You may want to focus on one area more than the others. If you have fallen or lose your balance, spend time doing balance exercises. If you are getting rounded shoulders, work more on posture exercises. If you have trouble climbing stairs or getting up from the couch, do more functional exercises. You can also perform these exercises at one time or spread them during your day. Work with a phyiscal therapist to learn the right exercises for you.
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
## 2022-02-13 LAB — PARATHYROID HORMONE, INTACT (NO CA): PTH: 64 pg/mL (ref 15–65)

## 2022-02-14 LAB — BASIC METABOLIC PANEL WITH GFR
BUN/Creatinine Ratio: 20 (calc) (ref 6–22)
BUN: 25 mg/dL (ref 7–25)
CO2: 25 mmol/L (ref 20–32)
Calcium: 9.1 mg/dL (ref 8.6–10.4)
Chloride: 105 mmol/L (ref 98–110)
Creat: 1.22 mg/dL — ABNORMAL HIGH (ref 0.60–1.00)
Glucose, Bld: 133 mg/dL — ABNORMAL HIGH (ref 65–99)
Potassium: 4.3 mmol/L (ref 3.5–5.3)
Sodium: 141 mmol/L (ref 135–146)
eGFR: 46 mL/min/{1.73_m2} — ABNORMAL LOW (ref >=60)

## 2022-02-14 LAB — VITAMIN D 1,25 DIHYDROXY
Vitamin D 1, 25 (OH)2 Total: 56 pg/mL (ref 18–72)
Vitamin D2 1, 25 (OH)2: <8 pg/mL
Vitamin D3 1, 25 (OH)2: 56 pg/mL

## 2022-02-17 ENCOUNTER — Ambulatory Visit
Admission: RE | Admit: 2022-02-17 | Discharge: 2022-02-17 | Disposition: A | Discharge status: Home / Self Care | Payer: Medicare HMO | Source: Ambulatory Visit | Attending: Family Medicine | Admitting: Family Medicine

## 2022-02-17 DIAGNOSIS — Z1231 Encounter for screening mammogram for malignant neoplasm of breast: Secondary | ICD-10-CM

## 2022-02-19 ENCOUNTER — Encounter: Payer: Self-pay | Admitting: Internal Medicine

## 2022-02-28 ENCOUNTER — Ambulatory Visit: Payer: Medicare Other | Admitting: Endocrinology

## 2022-03-11 ENCOUNTER — Ambulatory Visit (INDEPENDENT_AMBULATORY_CARE_PROVIDER_SITE_OTHER)
Admission: RE | Admit: 2022-03-11 | Discharge: 2022-03-11 | Disposition: A | Discharge status: Home / Self Care | Payer: Medicare HMO | Source: Ambulatory Visit | Attending: Internal Medicine | Admitting: Internal Medicine

## 2022-03-11 DIAGNOSIS — M810 Age-related osteoporosis without current pathological fracture: Secondary | ICD-10-CM

## 2022-04-12 NOTE — Telephone Encounter (Signed)
Prolia VOB initiated via [email or web address]  Last Prolia inj 11/12/21 Next Prolia inj due 05/16/22

## 2022-05-02 ENCOUNTER — Other Ambulatory Visit: Payer: Self-pay

## 2022-05-02 NOTE — Telephone Encounter (Signed)
Pt ready for scheduling on or after 05/16/22   Out-of-pocket cost due at time of visit: $0   Primary: Aetna Medicare  Prolia co-insurance: 0% Admin fee co-insurance: 0%   Secondary: n/a Prolia co-insurance:  Admin fee co-insurance:    Deductible: does not apply   Prior Auth: APPROVED PA# [id number] Valid: 11/13/21-11/13/22     ** This summary of benefits is an estimation of the patient's out-of-pocket cost. Exact cost may very based on individual plan coverage.

## 2022-05-20 ENCOUNTER — Other Ambulatory Visit (INDEPENDENT_AMBULATORY_CARE_PROVIDER_SITE_OTHER): Payer: Medicare HMO

## 2022-05-20 ENCOUNTER — Ambulatory Visit (INDEPENDENT_AMBULATORY_CARE_PROVIDER_SITE_OTHER): Payer: Medicare HMO

## 2022-05-20 DIAGNOSIS — E559 Vitamin D deficiency, unspecified: Secondary | ICD-10-CM

## 2022-05-20 DIAGNOSIS — M810 Age-related osteoporosis without current pathological fracture: Secondary | ICD-10-CM

## 2022-05-20 DIAGNOSIS — E211 Secondary hyperparathyroidism, not elsewhere classified: Secondary | ICD-10-CM | POA: Diagnosis not present

## 2022-05-20 LAB — VITAMIN D 25 HYDROXY (VIT D DEFICIENCY, FRACTURES): VITD: 49.63 ng/mL (ref 30.00–100.00)

## 2022-05-20 MED ORDER — DENOSUMAB 60 MG/ML ~~LOC~~ SOSY
60.0000 mg | PREFILLED_SYRINGE | Freq: Once | SUBCUTANEOUS | Status: AC
  Administered 2022-05-20: 60 mg via SUBCUTANEOUS

## 2022-05-20 NOTE — Progress Notes (Signed)
Patient verbally confirmed name, date of birth, and correct medication to be administered. Prolia injection administered and pt tolerated well.  

## 2022-05-22 LAB — PTH, INTACT AND CALCIUM
Calcium: 9.5 mg/dL (ref 8.7–10.3)
PTH: 77 pg/mL — ABNORMAL HIGH (ref 15–65)

## 2022-05-25 LAB — VITAMIN D 1,25 DIHYDROXY
Vitamin D 1, 25 (OH)2 Total: 50 pg/mL (ref 18–72)
Vitamin D2 1, 25 (OH)2: <8 pg/mL
Vitamin D3 1, 25 (OH)2: 50 pg/mL

## 2022-06-21 NOTE — Telephone Encounter (Signed)
Last Prolia inj 05/20/22 Next Prolia inj due 11/19/22

## 2022-07-31 ENCOUNTER — Ambulatory Visit (INDEPENDENT_AMBULATORY_CARE_PROVIDER_SITE_OTHER): Payer: Medicare HMO | Admitting: Adult Health

## 2022-07-31 VITALS — BP 120/60 | HR 93 | Temp 98.6°F | Ht 59.0 in | Wt 165.0 lb

## 2022-07-31 DIAGNOSIS — R051 Acute cough: Secondary | ICD-10-CM | POA: Diagnosis not present

## 2022-07-31 LAB — POCT INFLUENZA A/B
Influenza A, POC: NEGATIVE
Influenza B, POC: NEGATIVE

## 2022-07-31 LAB — POC COVID19 BINAXNOW: SARS Coronavirus 2 Ag: NEGATIVE

## 2022-07-31 MED ORDER — BENZONATATE 200 MG PO CAPS: 200.0000 mg | ORAL_CAPSULE | Freq: Three times a day (TID) | ORAL | 0 refills | Status: DC | PRN

## 2022-07-31 NOTE — Progress Notes (Signed)
Subjective:    Patient ID: Miranda Mcguire, female    DOB: 12-Dec-1945, 77 y.o.   MRN: [id number]  Cough Associated symptoms include headaches.  Headache  Associated symptoms include coughing.   77 year old female who  has a past medical history of Allergy, AML (acute myeloid leukemia) ([place]), Anemia, Cancer ([place]), Cataracts, bilateral, Endometrial polyp, Hyperlipidemia, Hypertension, OA (osteoarthritis), and OP (osteoporosis).  She is a patient of Dr. Sarajane Jews who I am seeing today for an acute issue. Her symptoms started about 5 days ago. Symptoms include that of dry hacking cough and rhinorrhea. She reports cough is the worst at night   She denies fevers or chills, shortness of breath,, wheezing, sinus pain/pressure, headache, or n/v  At home she has been using dayquil/nyquil which seem to work well.   Her cough has improved.   Review of Systems  Respiratory:  Positive for cough.   Neurological:  Positive for headaches.   See HPI   Past Medical History:  Diagnosis Date   Allergy    AML (acute myeloid leukemia) ([place])    se[facility] Phill Myron [facility]    Anemia    nos   Cancer [facility])    Cataracts, bilateral    sees Dr. Baldemar Lenis    Endometrial polyp    post-menopausal bleeding   Hyperlipidemia    Hypertension    OA (osteoarthritis)    OP (osteoporosis)    last dexa 08-15-08. sees Dr. Renato Shin    Social History   Socioeconomic History   Marital status: Married    Spouse name: Sonia Side   Number of children: 1   Years of education: Not on file   Highest education level: 12th grade  Occupational History   Not on file  Tobacco Use   Smoking status: Former    Types: Cigarettes   Smokeless tobacco: Never  Vaping Use   Vaping Use: Never used  Substance and Sexual Activity   Alcohol use: No    Alcohol/week: 0.0 standard drinks of alcohol   Drug use: No   Sexual activity: Not on file  Other Topics Concern   Not on file  Social History Narrative    Not on file   Social Determinants of Health   Financial Resource Strain: Low Risk  (07/31/2022)   Overall Financial Resource Strain (CARDIA)    Difficulty of Paying Living Expenses: Not hard at all  Food Insecurity: No Food Insecurity (07/31/2022)   Hunger Vital Sign    Worried About Running Out of Food in the Last Year: Never true    Ran Out of Food in the Last Year: Never true  Transportation Needs: No Transportation Needs (07/31/2022)   PRAPARE - Hydrologist (Medical): No    Lack of Transportation (Non-Medical): No  Physical Activity: Inactive (07/31/2022)   Exercise Vital Sign    Days of Exercise per Week: 0 days    Minutes of Exercise per Session: 0 min  Stress: No Stress Concern Present (07/31/2022)   [place]    Feeling of Stress : Not at all  Social Connections: Unknown (07/31/2022)   Social Connection and Isolation Panel [NHANES]    Frequency of Communication with Friends and Family: More than three times a week    Frequency of Social Gatherings with Friends and Family: More than three times a week    Attends Religious Services: Patient refused  Active Member of Clubs or Organizations: Yes    Attends Archivist Meetings: More than 4 times per year    Marital Status: Married  Human resources officer Violence: Not At Risk (01/10/2022)   Humiliation, Afraid, Rape, and Kick questionnaire    Fear of Current or Ex-Partner: No    Emotionally Abused: No    Physically Abused: No    Sexually Abused: No    Past Surgical History:  Procedure Laterality Date   BONE MARROW TRANSPLANT  08/2016   COLONOSCOPY  08/06/2016   at [facility], benign polyps, repeat in 5 yrs    DILATION AND CURETTAGE OF UTERUS     endometrial polypectomy     with D and C 03-08-09 pr dr Gardenia Phlegm   herniated disc     L4-5 per Dr Saintclair Halsted   TONSILECTOMY, ADENOIDECTOMY, BILATERAL MYRINGOTOMY AND TUBES      Family  History  Problem Relation Age of Onset   Dementia Mother    Heart disease Father    Osteoporosis Sister    Stroke Other    Colon cancer Neg Hx    Esophageal cancer Neg Hx    Rectal cancer Neg Hx    Pancreatic cancer Neg Hx    Stomach cancer Neg Hx     Allergies  Allergen Reactions   Latex Other (See Comments)   Other    Penicillins Other (See Comments)    Gas pocket    Prunus Persica Hives    "Peaches"   Tape Dermatitis    Blistering - use Mepilex   Aspirin Hives and Rash    Current Outpatient Medications on File Prior to Visit  Medication Sig Dispense Refill   amLODipine (NORVASC) 10 MG tablet Take 1 tablet (10 mg total) by mouth daily. 90 tablet 3   atorvastatin (LIPITOR) 20 MG tablet Take 1 tablet (20 mg total) by mouth daily. 90 tablet 3   B Complex-C (SUPER B COMPLEX PO) Take by mouth daily.     cholecalciferol (VITAMIN D3) 25 MCG (1000 UNIT) tablet Take 3 tablets (3,000 Units total) by mouth daily. 90 tablet 11   levothyroxine (SYNTHROID) 50 MCG tablet Take 1 tablet (50 mcg total) by mouth daily. 90 tablet 3   loratadine (CLARITIN) 10 MG tablet Take 10 mg by mouth daily. allertec     Magnesium Oxide 250 MG TABS Take 1 tablet by mouth daily.      metoprolol tartrate (LOPRESSOR) 25 MG tablet Take 1 tablet (25 mg total) by mouth 2 (two) times daily with a meal. 180 tablet 3   Multiple Vitamin (MULTIVITAMIN) tablet Take 1 tablet by mouth daily.     Multiple Vitamins-Minerals (MACULAR HEALTH FORMULA PO) Take by mouth daily. Twice a day     Potassium Gluconate 550 (90 K) MG TABS Take 1 tablet by mouth daily. Every other day     No current facility-administered medications on file prior to visit.    BP 120/60   Pulse 93   Temp 98.6 F (37 C) (Oral)   Ht '4\' 11"'$  (1.499 m)   Wt 165 lb (74.8 kg)   SpO2 96%   BMI 33.33 kg/m       Objective:   Physical Exam Vitals and nursing note reviewed.  Constitutional:      Appearance: She is well-developed.  Cardiovascular:      Rate and Rhythm: Normal rate and regular rhythm.     Pulses: Normal pulses.     Heart sounds: Normal heart  sounds.  Pulmonary:     Effort: Pulmonary effort is normal.     Breath sounds: Normal breath sounds.  Skin:    General: Skin is warm and dry.  Neurological:     General: No focal deficit present.     Mental Status: She is alert and oriented to person, place, and time.  Psychiatric:        Mood and Affect: Mood normal.        Behavior: Behavior normal.        Thought Content: Thought content normal.        Judgment: Judgment normal.       Assessment & Plan:  1. Acute cough  - POC COVID-19 BinaxNow- negative  - POCT Influenza A/B- negative  - likely viral cough. Will send in Tessalon pearls and she can take Mucinex - Follow up if symptoms worsen  - benzonatate (TESSALON) 200 MG capsule; Take 1 capsule (200 mg total) by mouth 3 (three) times daily as needed for cough.  Dispense: 30 capsule; Refill: 0  Dorothyann Peng, NP

## 2022-08-15 ENCOUNTER — Other Ambulatory Visit: Payer: Self-pay | Admitting: Family Medicine

## 2022-09-26 ENCOUNTER — Ambulatory Visit (INDEPENDENT_AMBULATORY_CARE_PROVIDER_SITE_OTHER): Payer: Medicare HMO | Admitting: Family Medicine

## 2022-09-26 ENCOUNTER — Encounter: Payer: Self-pay | Admitting: Family Medicine

## 2022-09-26 VITALS — BP 124/62 | HR 88 | Temp 98.0°F | Ht 60.0 in | Wt 165.0 lb

## 2022-09-26 DIAGNOSIS — E039 Hypothyroidism, unspecified: Secondary | ICD-10-CM

## 2022-09-26 DIAGNOSIS — R739 Hyperglycemia, unspecified: Secondary | ICD-10-CM

## 2022-09-26 DIAGNOSIS — M810 Age-related osteoporosis without current pathological fracture: Secondary | ICD-10-CM | POA: Diagnosis not present

## 2022-09-26 DIAGNOSIS — D696 Thrombocytopenia, unspecified: Secondary | ICD-10-CM | POA: Diagnosis not present

## 2022-09-26 DIAGNOSIS — N1832 Chronic kidney disease, stage 3b: Secondary | ICD-10-CM | POA: Diagnosis not present

## 2022-09-26 DIAGNOSIS — C9201 Acute myeloblastic leukemia, in remission: Secondary | ICD-10-CM

## 2022-09-26 DIAGNOSIS — E782 Mixed hyperlipidemia: Secondary | ICD-10-CM

## 2022-09-26 DIAGNOSIS — M159 Polyosteoarthritis, unspecified: Secondary | ICD-10-CM

## 2022-09-26 DIAGNOSIS — I10 Essential (primary) hypertension: Secondary | ICD-10-CM | POA: Diagnosis not present

## 2022-09-26 LAB — T4, FREE: Free T4: 0.79 ng/dL (ref 0.60–1.60)

## 2022-09-26 LAB — CBC WITH DIFFERENTIAL/PLATELET
Basophils Absolute: 0.1 10*3/uL (ref 0.0–0.1)
Basophils Relative: 1.4 % (ref 0.0–3.0)
Eosinophils Absolute: 0.1 10*3/uL (ref 0.0–0.7)
Eosinophils Relative: 1.1 % (ref 0.0–5.0)
HCT: 40.3 % (ref 36.0–46.0)
Hemoglobin: 13.2 g/dL (ref 12.0–15.0)
Lymphocytes Relative: 17.7 % (ref 12.0–46.0)
Lymphs Abs: 1.4 10*3/uL (ref 0.7–4.0)
MCHC: 32.8 g/dL (ref 30.0–36.0)
MCV: 91.6 fl (ref 78.0–100.0)
Monocytes Absolute: 0.7 10*3/uL (ref 0.1–1.0)
Monocytes Relative: 8.3 % (ref 3.0–12.0)
Neutro Abs: 5.7 10*3/uL (ref 1.4–7.7)
Neutrophils Relative %: 71.5 % (ref 43.0–77.0)
Platelets: 223 10*3/uL (ref 150.0–400.0)
RBC: 4.39 Mil/uL (ref 3.87–5.11)
RDW: 13.9 % (ref 11.5–15.5)
WBC: 7.9 10*3/uL (ref 4.0–10.5)

## 2022-09-26 LAB — LIPID PANEL
Cholesterol: 217 mg/dL — ABNORMAL HIGH (ref 0–200)
HDL: 72.3 mg/dL (ref >39.00)
LDL Cholesterol: 106 mg/dL — ABNORMAL HIGH (ref 0–99)
NonHDL: 144.47
Total CHOL/HDL Ratio: 3
Triglycerides: 194 mg/dL — ABNORMAL HIGH (ref 0.0–149.0)
VLDL: 38.8 mg/dL (ref 0.0–40.0)

## 2022-09-26 LAB — HEPATIC FUNCTION PANEL
ALT: 27 U/L (ref 0–35)
AST: 20 U/L (ref 0–37)
Albumin: 4.4 g/dL (ref 3.5–5.2)
Alkaline Phosphatase: 51 U/L (ref 39–117)
Bilirubin, Direct: 0.1 mg/dL (ref 0.0–0.3)
Total Bilirubin: 0.7 mg/dL (ref 0.2–1.2)
Total Protein: 7.3 g/dL (ref 6.0–8.3)

## 2022-09-26 LAB — BASIC METABOLIC PANEL
BUN: 19 mg/dL (ref 6–23)
CO2: 26 mEq/L (ref 19–32)
Calcium: 9.3 mg/dL (ref 8.4–10.5)
Chloride: 105 mEq/L (ref 96–112)
Creatinine, Ser: 1.15 mg/dL (ref 0.40–1.20)
GFR: 45.65 mL/min — ABNORMAL LOW (ref >60.00)
Glucose, Bld: 107 mg/dL — ABNORMAL HIGH (ref 70–99)
Potassium: 4.2 mEq/L (ref 3.5–5.1)
Sodium: 144 mEq/L (ref 135–145)

## 2022-09-26 LAB — HEMOGLOBIN A1C: Hgb A1c MFr Bld: 6.6 % — ABNORMAL HIGH (ref 4.6–6.5)

## 2022-09-26 LAB — T3, FREE: T3, Free: 2.5 pg/mL (ref 2.3–4.2)

## 2022-09-26 LAB — TSH: TSH: 1.33 u[IU]/mL (ref 0.35–5.50)

## 2022-09-26 MED ORDER — LEVOTHYROXINE SODIUM 50 MCG PO TABS: 50.0000 ug | ORAL_TABLET | Freq: Every day | ORAL | 3 refills | Status: DC

## 2022-09-26 MED ORDER — AMLODIPINE BESYLATE 10 MG PO TABS: 10.0000 mg | ORAL_TABLET | Freq: Every day | ORAL | 3 refills | Status: DC

## 2022-09-26 MED ORDER — METOPROLOL TARTRATE 25 MG PO TABS: 25.0000 mg | ORAL_TABLET | Freq: Two times a day (BID) | ORAL | 3 refills | Status: DC

## 2022-09-26 MED ORDER — ATORVASTATIN CALCIUM 20 MG PO TABS: 20.0000 mg | ORAL_TABLET | Freq: Every day | ORAL | 3 refills | Status: DC

## 2022-09-26 NOTE — Progress Notes (Signed)
Subjective:    Patient ID: Miranda Mcguire, female    DOB: February 07, 1946, 77 y.o.   MRN: [id number]  HPI Here to follow up on issues. She feels well in general. She saw Oncology at [place] last month, and she has been in remission from AML 6 years now. Her iron and ferritin were stable. Her BP has been well controlled. She had a colonoscopy last year, and she will not need any more. Her last DEXA was last September. Her OA is stable, and she recently had another steroid shot to her knee at [facility].    Review of Systems  Constitutional: Negative.   HENT: Negative.    Eyes: Negative.   Respiratory: Negative.    Cardiovascular: Negative.   Gastrointestinal: Negative.   Genitourinary:  Negative for decreased urine volume, difficulty urinating, dyspareunia, dysuria, enuresis, flank pain, frequency, hematuria, pelvic pain and urgency.  Musculoskeletal:  Positive for arthralgias.  Skin: Negative.   Neurological: Negative.  Negative for headaches.  Psychiatric/Behavioral: Negative.         Objective:   Physical Exam Constitutional:      General: She is not in acute distress.    Appearance: Normal appearance. She is well-developed.  HENT:     Head: Normocephalic and atraumatic.     Right Ear: External ear normal.     Left Ear: External ear normal.     Nose: Nose normal.     Mouth/Throat:     Pharynx: No oropharyngeal exudate.  Eyes:     General: No scleral icterus.    Conjunctiva/sclera: Conjunctivae normal.     Pupils: Pupils are equal, round, and reactive to light.  Neck:     Thyroid: No thyromegaly.     Vascular: No JVD.  Cardiovascular:     Rate and Rhythm: Normal rate and regular rhythm.     Heart sounds: Normal heart sounds. No murmur heard.    No friction rub. No gallop.  Pulmonary:     Effort: Pulmonary effort is normal. No respiratory distress.     Breath sounds: Normal breath sounds. No wheezing or rales.  Chest:     Chest wall: No tenderness.   Abdominal:     General: Bowel sounds are normal. There is no distension.     Palpations: Abdomen is soft. There is no mass.     Tenderness: There is no abdominal tenderness. There is no guarding or rebound.  Musculoskeletal:        General: No tenderness. Normal range of motion.     Cervical back: Normal range of motion and neck supple.  Lymphadenopathy:     Cervical: No cervical adenopathy.  Skin:    General: Skin is warm and dry.     Findings: No erythema or rash.  Neurological:     Mental Status: She is alert and oriented to person, place, and time.     Cranial Nerves: No cranial nerve deficit.     Motor: No abnormal muscle tone.     Coordination: Coordination normal.     Deep Tendon Reflexes: Reflexes are normal and symmetric. Reflexes normal.  Psychiatric:        Behavior: Behavior normal.        Thought Content: Thought content normal.        Judgment: Judgment normal.           Assessment & Plan:  Her HTN and osteoporosis are stable. She remains in remission from AML. Her OA is stable. We  will get fasting labs to check lipids, a thyroid panel, renal function, etc. We spent a total of ( 33  ) minutes reviewing records and discussing these issues.  Alysia Penna, MD

## 2022-10-09 NOTE — Telephone Encounter (Signed)
Prolia VOB initiated via [email or web address]  Last OV: 02/11/22 Next OV:  Last Prolia inj 05/20/22 Next Prolia inj due 11/19/22

## 2022-10-16 NOTE — Telephone Encounter (Signed)
Prior auth required for PROLIA  PA PROCESS DETAILS: Precertification is required. Call [phone] or complete the Precertification form available at [email or web address] 

## 2022-10-20 NOTE — Telephone Encounter (Signed)
Prior Authorization initiated for [facility] via [employer] Case ID: [id number]

## 2022-10-21 NOTE — Telephone Encounter (Signed)
.  Pt ready for scheduling on or after 11/19/22  Out-of-pocket cost due at time of visit: $0  Primary: [employer] Adv ESA PPO Prolia co-insurance: 0% Admin fee co-insurance: 0%  Deductible: does not apply  Prior Auth: APPROVED PA# [id number] Valid: 11/15/22-11/15/23  Secondary: N/A Prolia co-insurance:  Admin fee co-insurance:  Deductible:  Prior Auth:  PA# Valid:     ** This summary of benefits is an estimation of the patient's out-of-pocket cost. Exact cost may very based on individual plan coverage.

## 2022-10-21 NOTE — Telephone Encounter (Signed)
APPROVED  PA# [id number] Valid: 11/15/22-11/15/23

## 2022-10-22 NOTE — Telephone Encounter (Signed)
Patient is scheduled for next Prolia injection on 11/19/2022.

## 2022-11-19 ENCOUNTER — Ambulatory Visit (INDEPENDENT_AMBULATORY_CARE_PROVIDER_SITE_OTHER): Payer: Medicare HMO

## 2022-11-19 VITALS — BP 120/72 | HR 93 | Ht 60.0 in | Wt 167.6 lb

## 2022-11-19 DIAGNOSIS — M810 Age-related osteoporosis without current pathological fracture: Secondary | ICD-10-CM | POA: Diagnosis not present

## 2022-11-19 MED ORDER — DENOSUMAB 60 MG/ML ~~LOC~~ SOSY
60.0000 mg | PREFILLED_SYRINGE | Freq: Once | SUBCUTANEOUS | Status: AC
  Administered 2022-11-19: 60 mg via SUBCUTANEOUS

## 2022-11-19 NOTE — Progress Notes (Signed)
Patient verbally confirmed name, date of birth, and correct medication to be administered. Prolia injection administered and pt tolerated well.  

## 2022-12-05 NOTE — Telephone Encounter (Signed)
Last Prolia inj 11/19/22 Next Prolia inj due 05/23/23

## 2023-01-05 ENCOUNTER — Other Ambulatory Visit: Payer: Self-pay | Admitting: Family Medicine

## 2023-01-05 DIAGNOSIS — Z1231 Encounter for screening mammogram for malignant neoplasm of breast: Secondary | ICD-10-CM

## 2023-01-15 ENCOUNTER — Encounter: Payer: Self-pay | Admitting: Family Medicine

## 2023-01-15 ENCOUNTER — Ambulatory Visit: Payer: Medicare HMO | Admitting: Family Medicine

## 2023-01-15 VITALS — BP 132/76 | HR 92 | Temp 97.2°F | Wt 164.0 lb

## 2023-01-15 DIAGNOSIS — R058 Other specified cough: Secondary | ICD-10-CM | POA: Diagnosis not present

## 2023-01-15 DIAGNOSIS — R0982 Postnasal drip: Secondary | ICD-10-CM | POA: Diagnosis not present

## 2023-01-15 NOTE — Progress Notes (Signed)
Established Patient Office Visit   Subjective  Patient ID: Miranda Mcguire, female    DOB: 11-Mar-1946  Age: 77 y.o. MRN: [id number]  Chief Complaint  Patient presents with   Medical Management of Chronic Issues    ED f/u for bronchitis, UC visit on 7/5. Cough is very annoying. Finsihed the dexamethasone, also ha the tesselon for cough, but cough will not go away. Took mucinex this morning and has been using cough drops all day.   Patient accompanied by her husband.   Patient is a 77 year old female followed by Dr. Clent Ridges and seen for acute concern.  Patient [facility] on [facility] for viral illness with cough and conjunctivitis.  CXR negative.  Given Tessalon, Decadron x 5 d, Polytrim eyedrops.  Pt.states she is still coughing.  Tried Mucinex.  Denies fever, chills, sore throat, ear pain/pressure, facial pain/pressure, SOB, wheezing.  Taking OTC [employer] brand antihistamine times years.    Past Medical History:  Diagnosis Date   Allergy    AML (acute myeloid leukemia) (HCC)    sees Dr. Willette Pa at [facility]    Anemia    nos   Cancer [facility])    Cataracts, bilateral    sees Dr. Izell [place]    Endometrial polyp    post-menopausal bleeding   Hyperlipidemia    Hypertension    OA (osteoarthritis)    OP (osteoporosis)    last dexa 08-15-08. sees Dr. 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 Tomasa Rand, benign polyps, no repeats needed   DILATION AND CURETTAGE OF UTERUS     endometrial polypectomy     with D and C 03-08-09 pr dr Audrea Muscat   herniated disc     L4-5 per Dr Wynetta Emery   TONSILECTOMY, ADENOIDECTOMY, BILATERAL MYRINGOTOMY AND TUBES     Social History   Tobacco Use   Smoking status: Former    Types: Cigarettes   Smokeless tobacco: Never  Vaping Use   Vaping status: Never Used  Substance Use Topics   Alcohol use: No    Alcohol/week: 0.0 standard drinks of alcohol   Drug  use: No   Family History  Problem Relation Age of Onset   Dementia Mother    Heart disease Father    Osteoporosis Sister    Stroke Other    Colon cancer Neg Hx    Esophageal cancer Neg Hx    Rectal cancer Neg Hx    Pancreatic cancer Neg Hx    Stomach cancer Neg Hx    Allergies  Allergen Reactions   Latex Other (See Comments)   Other    Penicillins Other (See Comments)    Gas pocket    Prunus Persica Hives    "Peaches"   Tape Dermatitis    Blistering - use Mepilex   Aspirin Hives and Rash      ROS Negative unless stated above    Objective:     BP 132/76 (BP Location: Left Arm, Patient Position: Sitting, Cuff Size: Normal)   Pulse 92   Temp (!) 97.2 F (36.2 C) (Temporal)   Wt 164 lb (74.4 kg)   SpO2 93%   BMI 32.03 kg/m    Physical Exam Constitutional:      General: She is not in acute distress.    Appearance: Normal appearance.  HENT:     Head: Normocephalic and atraumatic.  Nose: Nose normal.     Mouth/Throat:     Mouth: Mucous membranes are moist.  Cardiovascular:     Rate and Rhythm: Normal rate and regular rhythm.     Heart sounds: Normal heart sounds. No murmur heard.    No gallop.  Pulmonary:     Effort: Pulmonary effort is normal. No respiratory distress.     Breath sounds: Normal breath sounds. No wheezing, rhonchi or rales.     Comments: Intermittent dry cough. Skin:    General: Skin is warm and dry.  Neurological:     Mental Status: She is alert and oriented to person, place, and time.     No results found for any visits on 01/15/23.    Assessment & Plan:  Post-viral cough syndrome  Post-nasal drainage  Patient with continued cough status post viral URI.  Postnasal drainage also contributing to cough.  Advised on possible duration of cough.  Lung exam reassuring.  Discussed supportive care including antihistamines.  Patient to consider switching to a different p.o. antihistamine.  Given sample of Zyrtec in clinic.  Consider  nasal spray.  Advised to follow-up in the next few weeks for continued or worsening symptoms.  Return if symptoms worsen or fail to improve.   Deeann Saint, MD

## 2023-01-15 NOTE — Patient Instructions (Signed)
Try over-the-counter antihistamines (allergy medications) such as Allegra, Zyrtec, Xyzal, or Claritin to help with the postnasal drainage.  Flonase nasal spray can also be helpful for symptoms.  It is also available in generic, fluticasone.  If you do not like the idea of spraying steroid in your nose may also have saline nasal rinse which is sterile saline and a spray bottle.  These can be found at your local drugstore.

## 2023-01-16 ENCOUNTER — Ambulatory Visit (INDEPENDENT_AMBULATORY_CARE_PROVIDER_SITE_OTHER): Payer: Medicare HMO

## 2023-01-16 VITALS — Ht 59.0 in | Wt 164.0 lb

## 2023-01-16 DIAGNOSIS — Z0000 Encounter for general adult medical examination without abnormal findings: Secondary | ICD-10-CM

## 2023-01-16 NOTE — Patient Instructions (Addendum)
Miranda Mcguire , Thank you for taking time to come for your Medicare Wellness Visit. I appreciate your ongoing commitment to your health goals. Please review the following plan we discussed and let me know if I can assist you in the future.   These are the goals we discussed:  Goals       Patient Stated (pt-stated)      Maintain good health.      Pharmacy Care Plan      CARE PLAN ENTRY  Current Barriers:  Chronic Disease Management support, education, and care coordination needs related to Hypertension, Hyperlipidemia, and Hypothyroidism, Osteoporosis, Osteoarthritis, Allergic rhinitis  Hypertension Current antihypertensive regimen:  amlodipine 10mg , 1 tablt once daily  metoprolol tartrate 25mg , 1 tablet twice daily  Previous antihypertensives tried: none Last practice recorded BP readings:  BP Readings from Last 3 Encounters:  09/20/19 (!) 142/70  07/26/19 124/68  02/28/19 124/60  Current home BP readings: does not check at home  Hyperlipidemia Current antihyperlipidemic regimen: atorvastatin 20mg , 1 tablet once daily  Previous antihyperlipidemic medications tried none Most recent lipid panel:     Component Value Date/Time   CHOL 207 (H) 09/20/2019 0931   TRIG 247.0 (H) 09/20/2019 0931   HDL 57.40 09/20/2019 0931   CHOLHDL 4 09/20/2019 0931   VLDL 49.4 (H) 09/20/2019 0931   LDLCALC 116 (H) 02/07/2019 0826   LDLDIRECT 101.0 09/20/2019 0931  The 10-year ASCVD risk score Denman George DC Jr., et al., 2013) is: 22.6%  Hypothyroidism Current regimen: levothyroxine , 1 tablet once daily  TSH  Date Value Ref Range Status  09/20/2019 2.38 0.35 - 4.50 uIU/mL Final   Osteoporosis Current regimen:  Prolia 60mg  injection every 6 months Reclast IV, every year calcitriol 0.22mcg, 1 tablet 3 days per week vitamin D, , 1 tablet daily  calcium 1200mg  1 tablet twice daily   Allergic rhinitis Current regimen:  loratadine 10mg , 1 tablet once daily   Osteoarthritis Current  regimen: diclofenac 75mg , 1 tablet twice daily    Pharmacist Clinical Goal(s):  Hypertension Over the next 180 days, patient will maintain blood pressure <140/90 mmHg. Hyperlipidemia Over the next 180 days, patient will lower ASCVD risk by continuing statin medication and lifestyle modifications (diet/ exercise).  Hypothyroidism Over the next 180 days, patient will maintain TSH between 0.45 to 4.5uIU/ml. Osteoporosis Over the next 180 days, patient will minimize risk of fractures by continuing appointments for Reclast infusions and Prolia injections. Osteoarthritis Over the next 90 days, Patient will minimize pain levels by continuing current regimen and obtain steroid injections every 3 months as needed.  Interventions: Comprehensive medication review performed. Hypertension Recommend checking blood pressure 1 to 2 times per week.  Discussed diet modifications. DASH diet:  following a diet emphasizing fruits and vegetables and low-fat dairy products along with whole grains, fish, poultry, and nuts. Reducing red meats and sugars.  Discussed the importance of checking blood pressure at home. Hyperlipidemia We discussed how a diet high in plant sterols (fruits/vegetables/nuts/whole grains/legumes) may reduce your cholesterol.  Encouraged increasing fiber to a daily intake of 10-25g/day   Patient Self Care Activities:  Patient verbalizes understanding of plan as described above, Self administers medications as prescribed, Calls pharmacy for medication refills, and Calls provider office for new concerns or questions Over next 180 days, patient will: Continue current medications as directed by providers.  Continue following up with primary care provider and/or specialists.  Please see past updates related to this goal by clicking on the "Past Updates" button in  the selected goal        Stay healthy (pt-stated)        This is a list of the screening recommended for you and due dates:   Health Maintenance  Topic Date Due   COVID-19 Vaccine (7 - 2023-24 season) 06/10/2022   Flu Shot  02/05/2023   Medicare Annual Wellness Visit  01/16/2024   DTaP/Tdap/Td vaccine (5 - Td or Tdap) 06/10/2027   Pneumonia Vaccine  Completed   DEXA scan (bone density measurement)  Completed   Hepatitis C Screening  Completed   Zoster (Shingles) Vaccine  Completed   HPV Vaccine  Aged Out   Colon Cancer Screening  Discontinued    Advanced directives: In chart  Conditions/risks identified: None.  Next appointment: Follow up in one year for your annual wellness visit    Preventive Care 65 Years and Older, Female Preventive care refers to lifestyle choices and visits with your health care provider that can promote health and wellness. What does preventive care include? A yearly physical exam. This is also called an annual well check. Dental exams once or twice a year. Routine eye exams. Ask your health care provider how often you should have your eyes checked. Personal lifestyle choices, including: Daily care of your teeth and gums. Regular physical activity. Eating a healthy diet. Avoiding tobacco and drug use. Limiting alcohol use. Practicing safe sex. Taking low-dose aspirin every day. Taking vitamin and mineral supplements as recommended by your health care provider. What happens during an annual well check? The services and screenings done by your health care provider during your annual well check will depend on your age, overall health, lifestyle risk factors, and family history of disease. Counseling  Your health care provider may ask you questions about your: Alcohol use. Tobacco use. Drug use. Emotional well-being. Home and relationship well-being. Sexual activity. Eating habits. History of falls. Memory and ability to understand (cognition). Work and work Astronomer. Reproductive health. Screening  You may have the following tests or measurements: Height,  weight, and BMI. Blood pressure. Lipid and cholesterol levels. These may be checked every 5 years, or more frequently if you are over 38 years old. Skin check. Lung cancer screening. You may have this screening every year starting at age 78 if you have a 30-pack-year history of smoking and currently smoke or have quit within the past 15 years. Fecal occult blood test (FOBT) of the stool. You may have this test every year starting at age 39. Flexible sigmoidoscopy or colonoscopy. You may have a sigmoidoscopy every 5 years or a colonoscopy every 10 years starting at age 28. Hepatitis C blood test. Hepatitis B blood test. Sexually transmitted disease (STD) testing. Diabetes screening. This is done by checking your blood sugar (glucose) after you have not eaten for a while (fasting). You may have this done every 1-3 years. Bone density scan. This is done to screen for osteoporosis. You may have this done starting at age 14. Mammogram. This may be done every 1-2 years. Talk to your health care provider about how often you should have regular mammograms. Talk with your health care provider about your test results, treatment options, and if necessary, the need for more tests. Vaccines  Your health care provider may recommend certain vaccines, such as: Influenza vaccine. This is recommended every year. Tetanus, diphtheria, and acellular pertussis (Tdap, Td) vaccine. You may need a Td booster every 10 years. Zoster vaccine. You may need this after age 4. Pneumococcal 13-valent conjugate (  PCV13) vaccine. One dose is recommended after age 80. Pneumococcal polysaccharide (PPSV23) vaccine. One dose is recommended after age 83. Talk to your health care provider about which screenings and vaccines you need and how often you need them. This information is not intended to replace advice given to you by your health care provider. Make sure you discuss any questions you have with your health care  provider. Document Released: 07/20/2015 Document Revised: 03/12/2016 Document Reviewed: 04/24/2015 Elsevier Interactive Patient Education  2017 [employer].  Fall Prevention in the Home Falls can cause injuries. They can happen to people of all ages. There are many things you can do to make your home safe and to help prevent falls. What can I do on the outside of my home? Regularly fix the edges of walkways and driveways and fix any cracks. Remove anything that might make you trip as you walk through a door, such as a raised step or threshold. Trim any bushes or trees on the path to your home. Use bright outdoor lighting. Clear any walking paths of anything that might make someone trip, such as rocks or tools. Regularly check to see if handrails are loose or broken. Make sure that both sides of any steps have handrails. Any raised decks and porches should have guardrails on the edges. Have any leaves, snow, or ice cleared regularly. Use sand or salt on walking paths during winter. Clean up any spills in your garage right away. This includes oil or grease spills. What can I do in the bathroom? Use night lights. Install grab bars by the toilet and in the tub and shower. Do not use towel bars as grab bars. Use non-skid mats or decals in the tub or shower. If you need to sit down in the shower, use a plastic, non-slip stool. Keep the floor dry. Clean up any water that spills on the floor as soon as it happens. Remove soap buildup in the tub or shower regularly. Attach bath mats securely with double-sided non-slip rug tape. Do not have throw rugs and other things on the floor that can make you trip. What can I do in the bedroom? Use night lights. Make sure that you have a light by your bed that is easy to reach. Do not use any sheets or blankets that are too big for your bed. They should not hang down onto the floor. Have a firm chair that has side arms. You can use this for support while  you get dressed. Do not have throw rugs and other things on the floor that can make you trip. What can I do in the kitchen? Clean up any spills right away. Avoid walking on wet floors. Keep items that you use a lot in easy-to-reach places. If you need to reach something above you, use a strong step stool that has a grab bar. Keep electrical cords out of the way. Do not use floor polish or wax that makes floors slippery. If you must use wax, use non-skid floor wax. Do not have throw rugs and other things on the floor that can make you trip. What can I do with my stairs? Do not leave any items on the stairs. Make sure that there are handrails on both sides of the stairs and use them. Fix handrails that are broken or loose. Make sure that handrails are as long as the stairways. Check any carpeting to make sure that it is firmly attached to the stairs. Fix any carpet that is  loose or worn. Avoid having throw rugs at the top or bottom of the stairs. If you do have throw rugs, attach them to the floor with carpet tape. Make sure that you have a light switch at the top of the stairs and the bottom of the stairs. If you do not have them, ask someone to add them for you. What else can I do to help prevent falls? Wear shoes that: Do not have high heels. Have rubber bottoms. Are comfortable and fit you well. Are closed at the toe. Do not wear sandals. If you use a stepladder: Make sure that it is fully opened. Do not climb a closed stepladder. Make sure that both sides of the stepladder are locked into place. Ask someone to hold it for you, if possible. Clearly mark and make sure that you can see: Any grab bars or handrails. First and last steps. Where the edge of each step is. Use tools that help you move around (mobility aids) if they are needed. These include: Canes. Walkers. Scooters. Crutches. Turn on the lights when you go into a dark area. Replace any light bulbs as soon as they burn  out. Set up your furniture so you have a clear path. Avoid moving your furniture around. If any of your floors are uneven, fix them. If there are any pets around you, be aware of where they are. Review your medicines with your doctor. Some medicines can make you feel dizzy. This can increase your chance of falling. Ask your doctor what other things that you can do to help prevent falls. This information is not intended to replace advice given to you by your health care provider. Make sure you discuss any questions you have with your health care provider. Document Released: 04/19/2009 Document Revised: 11/29/2015 Document Reviewed: 07/28/2014 Elsevier Interactive Patient Education  2017 [employer].

## 2023-01-16 NOTE — Progress Notes (Addendum)
Subjective:   Miranda Mcguire is a 77 y.o. female who presents for Medicare Annual (Subsequent) preventive examination.  Visit Complete: Virtual  I connected with  Miranda Mcguire on 01/19/23 by a audio enabled telemedicine application and verified that I am speaking with the correct person using two identifiers.  Patient Location: Home  Provider Location: Home Office  I discussed the limitations of evaluation and management by telemedicine. The patient expressed understanding and agreed to proceed.  Patient Medicare AWV questionnaire was completed by the patient on ; I have confirmed that all information answered by patient is correct and no changes since this date.  Review of Systems     Cardiac Risk Factors include: advanced age (>3men, >49 women);hypertension     Objective:    Today's Vitals   01/16/23 1434  Weight: 164 lb (74.4 kg)  Height: 4\' 11"  (1.499 m)   Body mass index is 33.12 kg/m.     01/16/2023    2:44 PM 01/10/2022    9:12 AM 01/09/2021   10:31 AM 05/03/2015    8:51 AM 03/19/2015    9:33 AM 01/24/2015   10:25 AM 11/13/2014    4:16 PM  Advanced Directives  Does Patient Have a Medical Advance Directive? Yes Yes Yes Yes Yes  Yes  Type of Estate agent of [place];Living will Healthcare Power of [place];Living will Living will Healthcare Power of [place];Living will  Healthcare Power of [place];Living will Living will  Does patient want to make changes to medical advance directive? No - Patient declined No - Patient declined       Copy of Healthcare Power of Attorney in Chart? Yes - validated most recent copy scanned in chart (See row information) Yes - validated most recent copy scanned in chart (See row information)  Yes Yes No - copy requested No - copy requested    Current Medications (verified) Outpatient Encounter Medications as of 01/16/2023  Medication Sig   amLODipine (NORVASC) 10 MG tablet Take 1 tablet (10  mg total) by mouth daily.   atorvastatin (LIPITOR) 20 MG tablet Take 1 tablet (20 mg total) by mouth daily.   B Complex-C (SUPER B COMPLEX PO) Take by mouth daily.   cholecalciferol (VITAMIN D3) 25 MCG (1000 UNIT) tablet Take 3 tablets (3,000 Units total) by mouth daily.   levothyroxine (SYNTHROID) 50 MCG tablet Take 1 tablet (50 mcg total) by mouth daily.   loratadine (CLARITIN) 10 MG tablet Take 10 mg by mouth daily. allertec   Magnesium Oxide 250 MG TABS Take 1 tablet by mouth daily.    metoprolol tartrate (LOPRESSOR) 25 MG tablet Take 1 tablet (25 mg total) by mouth 2 (two) times daily with a meal.   Multiple Vitamin (MULTIVITAMIN) tablet Take 1 tablet by mouth daily.   Multiple Vitamins-Minerals (MACULAR HEALTH FORMULA PO) Take by mouth daily. Twice a day   Omega-3 Fatty Acids (FISH OIL PO) Take by mouth daily at 12 noon.   Potassium Gluconate 550 (90 K) MG TABS Take 1 tablet by mouth daily. Every other day   Probiotic Product (PROBIOTIC DAILY PO) Take by mouth daily at 2 PM.   No facility-administered encounter medications on file as of 01/16/2023.    Allergies (verified) Latex, Other, Penicillins, Prunus persica, Tape, and Aspirin   History: Past Medical History:  Diagnosis Date   Allergy    AML (acute myeloid leukemia) (HCC)    sees Dr. Willette Pa at [facility]    Anemia  nos   Cancer (HCC)    Cataracts, bilateral    sees Dr. Izell Leeper    Endometrial polyp    post-menopausal bleeding   Hyperlipidemia    Hypertension    OA (osteoarthritis)    OP (osteoporosis)    last dexa 08-15-08. sees Dr. 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 Tomasa Rand, benign polyps, no repeats needed   DILATION AND CURETTAGE OF UTERUS     endometrial polypectomy     with D and C 03-08-09 pr dr Audrea Muscat   herniated disc     L4-5 per Dr Wynetta Emery   TONSILECTOMY, ADENOIDECTOMY, BILATERAL  MYRINGOTOMY AND TUBES     Family History  Problem Relation Age of Onset   Dementia Mother    Heart disease Father    Osteoporosis Sister    Stroke Other    Colon cancer Neg Hx    Esophageal cancer Neg Hx    Rectal cancer Neg Hx    Pancreatic cancer Neg Hx    Stomach cancer Neg Hx    Social History   Socioeconomic History   Marital status: Married    Spouse name: Dorene Sorrow   Number of children: 1   Years of education: Not on file   Highest education level: 12th grade  Occupational History   Not on file  Tobacco Use   Smoking status: Former    Types: Cigarettes   Smokeless tobacco: Never  Vaping Use   Vaping status: Never Used  Substance and Sexual Activity   Alcohol use: No    Alcohol/week: 0.0 standard drinks of alcohol   Drug use: No   Sexual activity: Not on file  Other Topics Concern   Not on file  Social History Narrative   Not on file   Social Determinants of Health   Financial Resource Strain: Low Risk  (01/16/2023)   Overall Financial Resource Strain (CARDIA)    Difficulty of Paying Living Expenses: Not hard at all  Food Insecurity: No Food Insecurity (01/16/2023)   Hunger Vital Sign    Worried About Running Out of Food in the Last Year: Never true    Ran Out of Food in the Last Year: Never true  Transportation Needs: No Transportation Needs (01/16/2023)   PRAPARE - Administrator, Civil Service (Medical): No    Lack of Transportation (Non-Medical): No  Physical Activity: Inactive (01/16/2023)   Exercise Vital Sign    Days of Exercise per Week: 0 days    Minutes of Exercise per Session: 0 min  Stress: No Stress Concern Present (01/16/2023)   [employer] of Occupational Health - Occupational Stress Questionnaire    Feeling of Stress : Not at all  Social Connections: Socially Integrated (01/16/2023)   Social Connection and Isolation Panel [NHANES]    Frequency of Communication with Friends and Family: More than three times a week    Frequency  of Social Gatherings with Friends and Family: More than three times a week    Attends Religious Services: More than 4 times per year    Active Member of [employer] or Organizations: Yes    Attends Engineer, structural: More than 4 times per year    Marital Status: Married    Tobacco Counseling Counseling given: Not Answered   Clinical Intake:  Pre-visit preparation completed: No  Pain : No/denies pain  BMI - recorded: 33.12 Nutritional Status: BMI > 30  Obese Nutritional Risks: None Diabetes: No  How often do you need to have someone help you when you read instructions, pamphlets, or other written materials from your doctor or pharmacy?: 1 - Never  Interpreter Needed?: No  Information entered by :: Theresa Mulligan LPN   Activities of Daily Living    01/16/2023    2:42 PM  In your present state of health, do you have any difficulty performing the following activities:  Hearing? 0  Vision? 0  Difficulty concentrating or making decisions? 0  Walking or climbing stairs? 0  Dressing or bathing? 0  Doing errands, shopping? 0  Preparing Food and eating ? N  Using the Toilet? N  In the past six months, have you accidently leaked urine? N  Do you have problems with loss of bowel control? N  Managing your Medications? N  Managing your Finances? N  Housekeeping or managing your Housekeeping? N    Patient Care Team: Nelwyn Salisbury, MD as PCP - General Jenna Luo, MD as Referring Physician (Oncology) Malachy Mood, MD as Consulting Physician (Hematology) Verner Chol, [facility] (Inactive) as Pharmacist (Pharmacist)  Indicate any recent Medical Services you may have received from other than [facility] providers in the past year (date may be approximate).     Assessment:   This is a routine wellness examination for Miranda Mcguire.  Hearing/Vision screen Hearing Screening - Comments:: Denies hearing difficulties   Vision Screening - Comments:: Wears reading glasses - up  to date with routine eye exams with  Dr Elmer Picker  Dietary issues and exercise activities discussed:     Goals Addressed               This Visit's Progress     Patient Stated (pt-stated)        Maintain good health.       Depression Screen    01/16/2023    2:42 PM 01/16/2023    2:39 PM 09/26/2022    9:45 AM 01/10/2022    9:09 AM 01/09/2021   10:30 AM 09/20/2019    9:02 AM 08/16/2018    8:09 AM  PHQ 2/9 Scores  PHQ - 2 Score 0 0 0 0 0 0 0  PHQ- 9 Score   2   0     Fall Risk    01/16/2023    2:44 PM 09/26/2022    9:45 AM 07/31/2022    9:14 AM 01/10/2022    9:11 AM 01/09/2021   10:32 AM  Fall Risk   Falls in the past year? 1 0 0 0 0  Number falls in past yr: 0 0  0 0  Injury with Fall? 0 0  0 0  Risk for fall due to : No Fall Risks No Fall Risks  No Fall Risks Impaired balance/gait;Impaired vision;Impaired mobility  Risk for fall due to: Comment     when  back is in pain  Follow up Falls prevention discussed Falls evaluation completed   Falls prevention discussed    MEDICARE RISK AT HOME:    TIMED UP AND GO:  Was the test performed?  No    Cognitive Function:        01/19/2023    8:05 AM 01/10/2022    9:12 AM 01/09/2021   10:35 AM  6CIT Screen  What Year? 0 points 0 points 0 points  What month? 0 points 0 points 0 points  What time?  0 points 0 points 0 points  Count back from 20 0 points 0 points 0 points  Months in reverse 0 points 0 points 0 points  Repeat phrase 0 points 0 points 0 points  Total Score 0 points 0 points 0 points    Immunizations Immunization History  Administered Date(s) Administered   DTaP / IPV 02/23/2017, 04/20/2017, 06/08/2017   Fluad Quad(high Dose 65+) 04/26/2019, 04/23/2020   HIB (PRP-OMP) 02/23/2017, 04/20/2017, 06/08/2017   Hepatitis B, PED/ADOLESCENT 02/23/2017, 04/20/2017, 08/17/2017   Influenza Split 04/12/2012, 03/24/2013, 04/03/2014, 06/13/2015, 03/16/2018   Influenza Whole 04/17/2008, 04/10/2009   Influenza, High Dose  Seasonal PF 06/13/2015, 05/19/2016, 04/20/2017, 03/16/2018, 04/26/2019, 04/23/2020, 04/08/2022   Influenza-Unspecified 04/03/2014, 06/13/2015, 04/13/2021   MMR 03/10/2019   Moderna Covid-19 Vaccine Bivalent Booster 34yrs & up 03/02/2021   Moderna Sars-Covid-2 Vaccination 07/18/2019, 08/15/2019, 02/24/2020, 10/15/2020, 03/22/2021   Pneumococcal Conjugate-13 07/24/2014, 02/23/2017, 04/20/2017, 06/08/2017   Pneumococcal Polysaccharide-23 05/20/2010, 08/17/2017   RSV IGIV 04/29/2022   Tdap 06/09/2017   Unspecified SARS-COV-2 Vaccination 04/15/2022   Zoster Recombinant(Shingrix) 03/22/2018, 03/24/2018, 05/31/2018, 05/31/2018   Zoster, Live 07/04/2014    TDAP status: Up to date  Flu Vaccine status: Up to date  Pneumococcal vaccine status: Up to date  Covid-19 vaccine status: Completed vaccines  Qualifies for Shingles Vaccine? Yes   Zostavax completed Yes   Shingrix Completed?: Yes  Screening Tests Health Maintenance  Topic Date Due   COVID-19 Vaccine (7 - 2023-24 season) 06/10/2022   INFLUENZA VACCINE  02/05/2023   Medicare Annual Wellness (AWV)  01/16/2024   DTaP/Tdap/Td (5 - Td or Tdap) 06/10/2027   Pneumonia Vaccine 76+ Years old  Completed   DEXA SCAN  Completed   Hepatitis C Screening  Completed   Zoster Vaccines- Shingrix  Completed   HPV VACCINES  Aged Out   Colonoscopy  Discontinued    Health Maintenance  Health Maintenance Due  Topic Date Due   COVID-19 Vaccine (7 - 2023-24 season) 06/10/2022    Colorectal cancer screening: No longer required.   Mammogram status: No longer required due to Age.   Bone Density status: Completed 03/11/22. Results reflect: Bone density results: OSTEOPOROSIS. Repeat every   years.  Lung Cancer Screening: (Low Dose CT Chest recommended if Age 69-80 years, 20 pack-year currently smoking OR have quit w/in 15years.) does not qualify.     Additional Screening:  Hepatitis C Screening: does qualify; Completed 07/06/16  Vision  Screening: Recommended annual ophthalmology exams for early detection of glaucoma and other disorders of the eye. Is the patient up to date with their annual eye exam?  Yes  Who is the provider or what is the name of the office in which the patient attends annual eye exams? Dr Elmer Picker If pt is not established with a provider, would they like to be referred to a provider to establish care? No .   Dental Screening: Recommended annual dental exams for proper oral hygiene    Community Resource Referral / Chronic Care Management:  CRR required this visit?  No   CCM required this visit?  No     Plan:     I have personally reviewed and noted the following in the patient's chart:   Medical and social history Use of alcohol, tobacco or illicit drugs  Current medications and supplements including opioid prescriptions. Patient is not currently taking opioid prescriptions. Functional ability and status Nutritional status Physical activity Advanced directives List of other physicians Hospitalizations, surgeries, and ER visits in previous 12 months Vitals  Screenings to include cognitive, depression, and falls Referrals and appointments  In addition, I have reviewed and discussed with patient certain preventive protocols, quality metrics, and best practice recommendations. A written personalized care plan for preventive services as well as general preventive health recommendations were provided to patient.     Tillie Rung, LPN   [id number]   After Visit Summary: (MyChart) Due to this being a telephonic visit, the after visit summary with patients personalized plan was offered to patient via MyChart   Nurse Notes: 6CIT was completed on 01/16/24 with score of 0

## 2023-02-16 ENCOUNTER — Ambulatory Visit: Payer: Medicare HMO | Admitting: Internal Medicine

## 2023-02-18 ENCOUNTER — Encounter: Payer: Self-pay | Admitting: Internal Medicine

## 2023-02-18 ENCOUNTER — Ambulatory Visit (INDEPENDENT_AMBULATORY_CARE_PROVIDER_SITE_OTHER): Payer: Medicare HMO | Admitting: Internal Medicine

## 2023-02-18 VITALS — BP 138/64 | HR 87 | Ht 59.0 in | Wt 163.4 lb

## 2023-02-18 DIAGNOSIS — M810 Age-related osteoporosis without current pathological fracture: Secondary | ICD-10-CM | POA: Diagnosis not present

## 2023-02-18 DIAGNOSIS — E211 Secondary hyperparathyroidism, not elsewhere classified: Secondary | ICD-10-CM

## 2023-02-18 DIAGNOSIS — E559 Vitamin D deficiency, unspecified: Secondary | ICD-10-CM

## 2023-02-18 LAB — BASIC METABOLIC PANEL
BUN: 22 mg/dL (ref 6–23)
CO2: 27 mEq/L (ref 19–32)
Calcium: 9 mg/dL (ref 8.4–10.5)
Chloride: 103 mEq/L (ref 96–112)
Creatinine, Ser: 1.07 mg/dL (ref 0.40–1.20)
GFR: 49.64 mL/min — ABNORMAL LOW (ref >60.00)
Glucose, Bld: 120 mg/dL — ABNORMAL HIGH (ref 70–99)
Potassium: 4 mEq/L (ref 3.5–5.1)
Sodium: 139 mEq/L (ref 135–145)

## 2023-02-18 LAB — MAGNESIUM: Magnesium: 2.1 mg/dL (ref 1.5–2.5)

## 2023-02-18 LAB — VITAMIN D 25 HYDROXY (VIT D DEFICIENCY, FRACTURES): VITD: 50.93 ng/mL (ref 30.00–100.00)

## 2023-02-18 LAB — PHOSPHORUS: Phosphorus: 3 mg/dL (ref 2.3–4.6)
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
## 2023-02-18 NOTE — Patient Instructions (Addendum)
Let's continue Prolia.  Please stop at the lab.  If the vitamin D level is normal, let's check a 24-hour urine:  Patient information (Up-to-Date): Collection of a 24-hour urine specimen  - You should collect every drop of urine during each 24-hour period. It does not matter how much or little urine is passed each time, as long as every drop is collected. - Begin the urine collection in the morning after you wake up, after you have emptied your bladder for the first time. - Urinate (empty the bladder) for the first time and flush it down the toilet. Note the exact time (eg, 6:15 AM). You will begin the urine collection at this time. - Collect every drop of urine during the day and night in an empty collection bottle. Store the bottle at room temperature or in the refrigerator. - If you need to have a bowel movement, any urine passed with the bowel movement should be collected. Try not to include feces with the urine collection. If feces does get mixed in, do not try to remove the feces from the urine collection bottle. - Finish by collecting the first urine passed the next morning, adding it to the collection bottle. This should be within ten minutes before or after the time of the first morning void on the first day (which was flushed). In this example, you would try to void between 6:05 and 6:25 on the second day. - If you need to urinate one hour before the final collection time, drink a full glass of water so that you can void again at the appropriate time. If you have to urinate 20 minutes before, try to hold the urine until the proper time. - Please note the exact time of the final collection, even if it is not the same time as when collection began on day 1. - The bottle(s) may be kept at room temperature for a day or two, but should be kept cool or refrigerated for longer periods of time.  Please return in 1 year.

## 2023-02-19 ENCOUNTER — Ambulatory Visit
Admission: RE | Admit: 2023-02-19 | Discharge: 2023-02-19 | Disposition: A | Discharge status: Home / Self Care | Payer: Medicare HMO | Source: Ambulatory Visit | Attending: Family Medicine | Admitting: Family Medicine

## 2023-02-19 DIAGNOSIS — Z1231 Encounter for screening mammogram for malignant neoplasm of breast: Secondary | ICD-10-CM

## 2023-02-21 LAB — PARATHYROID HORMONE, INTACT (NO CA): PTH: 83 pg/mL — ABNORMAL HIGH (ref 15–65)

## 2023-02-22 LAB — VITAMIN D 1,25 DIHYDROXY
Vitamin D 1, 25 (OH)2 Total: 72 pg/mL (ref 18–72)
Vitamin D2 1, 25 (OH)2: <8 pg/mL
Vitamin D3 1, 25 (OH)2: 72 pg/mL

## 2023-02-22 LAB — CALCIUM, IONIZED: Calcium, Ion: 5 mg/dL (ref 4.7–5.5)

## 2023-02-23 ENCOUNTER — Other Ambulatory Visit: Payer: Medicare HMO

## 2023-02-23 ENCOUNTER — Encounter: Payer: Self-pay | Admitting: Internal Medicine

## 2023-02-23 DIAGNOSIS — E211 Secondary hyperparathyroidism, not elsewhere classified: Secondary | ICD-10-CM

## 2023-02-24 LAB — CREATININE, URINE, 24 HOUR: Creatinine, 24H Ur: 1.07 g/(24.h) (ref 0.50–2.15)

## 2023-04-21 ENCOUNTER — Telehealth: Payer: Self-pay | Admitting: Family Medicine

## 2023-04-21 NOTE — Telephone Encounter (Signed)
Spoke with pt verbalized understanding of Dr Clent Ridges advise regarding PNA vaccine

## 2023-04-21 NOTE — Telephone Encounter (Signed)
Pt called to ask MD if he is able to tell her if she is up to date with her Pneumo shots &/or does she need the Prevnar 20?  Please advise.

## 2023-04-21 NOTE — Telephone Encounter (Signed)
Pt has had several PNA vaccines. Please advise

## 2023-04-21 NOTE — Telephone Encounter (Signed)
She does not need the Prevnar 20

## 2023-04-27 ENCOUNTER — Ambulatory Visit (INDEPENDENT_AMBULATORY_CARE_PROVIDER_SITE_OTHER): Payer: Medicare HMO | Admitting: Family Medicine

## 2023-04-27 ENCOUNTER — Encounter: Payer: Self-pay | Admitting: Family Medicine

## 2023-04-27 VITALS — BP 118/60 | HR 85 | Temp 98.5°F | Wt 161.0 lb

## 2023-04-27 DIAGNOSIS — S39012A Strain of muscle, fascia and tendon of lower back, initial encounter: Secondary | ICD-10-CM | POA: Diagnosis not present

## 2023-04-27 MED ORDER — CYCLOBENZAPRINE HCL 10 MG PO TABS: 10.0000 mg | ORAL_TABLET | Freq: Three times a day (TID) | ORAL | 0 refills | Status: DC | PRN

## 2023-04-27 MED ORDER — METHYLPREDNISOLONE 4 MG PO TBPK: ORAL_TABLET | ORAL | 0 refills | Status: DC

## 2023-04-27 NOTE — Progress Notes (Signed)
Subjective:    Patient ID: Miranda Mcguire, female    DOB: June 20, 1946, 77 y.o.   MRN: [id number]  HPI Here for an injury to her lower back that happened at home 4 days ago. Her husband had fallen to the floor and she was trying to help him get back up, when she felt a sharp pain in the left lower back. This does not radiate to the legs. She has been taking Tylenol and applying BioFreeze.    Review of Systems  Constitutional: Negative.   Respiratory: Negative.    Cardiovascular: Negative.   Musculoskeletal:  Positive for back pain.       Objective:   Physical Exam Constitutional:      General: She is not in acute distress.    Appearance: Normal appearance.  Cardiovascular:     Rate and Rhythm: Normal rate and regular rhythm.     Pulses: Normal pulses.     Heart sounds: Normal heart sounds.  Pulmonary:     Effort: Pulmonary effort is normal.     Breath sounds: Normal breath sounds.  Musculoskeletal:     Comments: Mildly tender just to the left of the lumbar spine. ROM is full   Neurological:     Mental Status: She is alert.           Assessment & Plan:  Lumbar strain. She will take a Medrol dose pack and will use Flexeril at bedtime. Recheck as needed.  Gershon Crane, MD

## 2023-05-05 NOTE — Telephone Encounter (Signed)
Prolia VOB initiated via [email or web address]  Next Prolia inj DUE: 05/23/23

## 2023-05-18 ENCOUNTER — Encounter: Payer: Self-pay | Admitting: Family Medicine

## 2023-05-22 NOTE — Telephone Encounter (Signed)
Those are good readings. Let's stay on the current regimen

## 2023-05-26 ENCOUNTER — Ambulatory Visit (INDEPENDENT_AMBULATORY_CARE_PROVIDER_SITE_OTHER): Payer: Medicare HMO

## 2023-05-26 DIAGNOSIS — M810 Age-related osteoporosis without current pathological fracture: Secondary | ICD-10-CM | POA: Diagnosis not present

## 2023-05-26 MED ORDER — DENOSUMAB 60 MG/ML ~~LOC~~ SOSY
60.0000 mg | PREFILLED_SYRINGE | Freq: Once | SUBCUTANEOUS | Status: AC
  Administered 2023-11-24: 60 mg via SUBCUTANEOUS

## 2023-05-26 MED ORDER — DENOSUMAB 60 MG/ML ~~LOC~~ SOSY
60.0000 mg | PREFILLED_SYRINGE | Freq: Once | SUBCUTANEOUS | Status: AC
  Administered 2023-05-26: 60 mg via SUBCUTANEOUS

## 2023-05-26 NOTE — Progress Notes (Signed)
After obtaining consent, and per orders of Dr. Elvera Lennox, injection of Prolia given by Pollie Meyer. Patient instructed to remain in clinic for 20 minutes afterwards, and to report any adverse reaction to me immediately.

## 2023-05-27 NOTE — Telephone Encounter (Signed)
Prior Auth: APPROVED PA# [id number] Valid: 11/15/22-11/15/23

## 2023-05-29 NOTE — Telephone Encounter (Signed)
These readings are excellent !! Stay on the same regimen

## 2023-05-30 NOTE — Telephone Encounter (Signed)
Prior Auth on file and valid  Prior Auth: APPROVED PA# [id number] Valid: 11/15/22-11/15/23

## 2023-05-30 NOTE — Telephone Encounter (Signed)
Last Prolia inj 05/26/23 Next Prolia inj due 11/24/23

## 2023-05-30 NOTE — Telephone Encounter (Signed)
Pt ready for scheduling on or after 05/23/23  Out-of-pocket cost due at time of visit: $0  Primary: Aetna Medicare Adv ESA PPO Prolia co-insurance: Covers Medicare Part B co-insurance Admin fee co-insurance: Covers Medicare Part B co-insurance  Deductible:  Covered by secondary  Prior Auth: APPROVED PA# [id number] Valid: 11/15/22-11/15/23  Secondary: N/A Prolia co-insurance:  Admin fee co-insurance:  Deductible:  Prior Auth:  PA# Valid:     ** This summary of benefits is an estimation of the patient's out-of-pocket cost. Exact cost may very based on individual plan coverage.

## 2023-05-30 NOTE — Telephone Encounter (Signed)
Prior Authorization REQUIRED for PROLIA  PA PROCESS DETAILS: PA is required. Call [phone] or complete the PA form available at  [email or web address]   Fax completed form to [phone]. Or submit PA  request online at [email or web address]   Primary Aetna Medicare Adv   COVERAGE DETAILS: Prolia and administration will be covered at 100%. No deductible, coinsurance or  out of pocket max applies. The benefits provided on this Verification of Benefits form are Medical Benefits  and are the patient's In

## 2023-09-28 ENCOUNTER — Ambulatory Visit (INDEPENDENT_AMBULATORY_CARE_PROVIDER_SITE_OTHER): Payer: Medicare HMO | Admitting: Family Medicine

## 2023-09-28 ENCOUNTER — Encounter: Payer: Self-pay | Admitting: Family Medicine

## 2023-09-28 VITALS — BP 110/60 | HR 79 | Temp 98.1°F | Ht 59.0 in | Wt 161.6 lb

## 2023-09-28 DIAGNOSIS — I10 Essential (primary) hypertension: Secondary | ICD-10-CM | POA: Diagnosis not present

## 2023-09-28 DIAGNOSIS — E559 Vitamin D deficiency, unspecified: Secondary | ICD-10-CM

## 2023-09-28 DIAGNOSIS — C9201 Acute myeloblastic leukemia, in remission: Secondary | ICD-10-CM

## 2023-09-28 DIAGNOSIS — R739 Hyperglycemia, unspecified: Secondary | ICD-10-CM

## 2023-09-28 DIAGNOSIS — D696 Thrombocytopenia, unspecified: Secondary | ICD-10-CM

## 2023-09-28 DIAGNOSIS — E039 Hypothyroidism, unspecified: Secondary | ICD-10-CM | POA: Diagnosis not present

## 2023-09-28 DIAGNOSIS — E782 Mixed hyperlipidemia: Secondary | ICD-10-CM

## 2023-09-28 DIAGNOSIS — N1832 Chronic kidney disease, stage 3b: Secondary | ICD-10-CM

## 2023-09-28 DIAGNOSIS — D649 Anemia, unspecified: Secondary | ICD-10-CM

## 2023-09-28 DIAGNOSIS — M150 Primary generalized (osteo)arthritis: Secondary | ICD-10-CM

## 2023-09-28 DIAGNOSIS — M810 Age-related osteoporosis without current pathological fracture: Secondary | ICD-10-CM

## 2023-09-28 LAB — CBC WITH DIFFERENTIAL/PLATELET
Basophils Absolute: 0.1 10*3/uL (ref 0.0–0.1)
Basophils Relative: 1.7 % (ref 0.0–3.0)
Eosinophils Absolute: 0.1 10*3/uL (ref 0.0–0.7)
Eosinophils Relative: 1.1 % (ref 0.0–5.0)
HCT: 39.3 % (ref 36.0–46.0)
Hemoglobin: 12.9 g/dL (ref 12.0–15.0)
Lymphocytes Relative: 19.7 % (ref 12.0–46.0)
Lymphs Abs: 1.3 10*3/uL (ref 0.7–4.0)
MCHC: 32.9 g/dL (ref 30.0–36.0)
MCV: 91.4 fl (ref 78.0–100.0)
Monocytes Absolute: 0.5 10*3/uL (ref 0.1–1.0)
Monocytes Relative: 8.1 % (ref 3.0–12.0)
Neutro Abs: 4.6 10*3/uL (ref 1.4–7.7)
Neutrophils Relative %: 69.4 % (ref 43.0–77.0)
Platelets: 193 10*3/uL (ref 150.0–400.0)
RBC: 4.3 Mil/uL (ref 3.87–5.11)
RDW: 13.5 % (ref 11.5–15.5)
WBC: 6.6 10*3/uL (ref 4.0–10.5)

## 2023-09-28 LAB — BASIC METABOLIC PANEL
BUN: 17 mg/dL (ref 6–23)
CO2: 28 meq/L (ref 19–32)
Calcium: 9.3 mg/dL (ref 8.4–10.5)
Chloride: 106 meq/L (ref 96–112)
Creatinine, Ser: 1.05 mg/dL (ref 0.40–1.20)
GFR: 50.56 mL/min — ABNORMAL LOW (ref >60.00)
Glucose, Bld: 104 mg/dL — ABNORMAL HIGH (ref 70–99)
Potassium: 4.5 meq/L (ref 3.5–5.1)
Sodium: 143 meq/L (ref 135–145)

## 2023-09-28 LAB — HEMOGLOBIN A1C: Hgb A1c MFr Bld: 6.6 % — ABNORMAL HIGH (ref 4.6–6.5)

## 2023-09-28 LAB — T4, FREE: Free T4: 0.84 ng/dL (ref 0.60–1.60)

## 2023-09-28 LAB — LIPID PANEL
Cholesterol: 180 mg/dL (ref 0–200)
HDL: 59.2 mg/dL (ref >39.00)
LDL Cholesterol: 78 mg/dL (ref 0–99)
NonHDL: 120.56
Total CHOL/HDL Ratio: 3
Triglycerides: 215 mg/dL — ABNORMAL HIGH (ref 0.0–149.0)
VLDL: 43 mg/dL — ABNORMAL HIGH (ref 0.0–40.0)

## 2023-09-28 LAB — HEPATIC FUNCTION PANEL
ALT: 24 U/L (ref 0–35)
AST: 18 U/L (ref 0–37)
Albumin: 4.3 g/dL (ref 3.5–5.2)
Alkaline Phosphatase: 53 U/L (ref 39–117)
Bilirubin, Direct: 0.1 mg/dL (ref 0.0–0.3)
Total Bilirubin: 0.7 mg/dL (ref 0.2–1.2)
Total Protein: 6.9 g/dL (ref 6.0–8.3)

## 2023-09-28 LAB — TSH: TSH: 2.02 u[IU]/mL (ref 0.35–5.50)

## 2023-09-28 LAB — VITAMIN D 25 HYDROXY (VIT D DEFICIENCY, FRACTURES): VITD: 46 ng/mL (ref 30.00–100.00)

## 2023-09-28 LAB — T3, FREE: T3, Free: 3.2 pg/mL (ref 2.3–4.2)

## 2023-09-28 MED ORDER — ATORVASTATIN CALCIUM 20 MG PO TABS: 20.0000 mg | ORAL_TABLET | Freq: Every day | ORAL | 3 refills | Status: AC

## 2023-09-28 MED ORDER — LEVOTHYROXINE SODIUM 50 MCG PO TABS: 50.0000 ug | ORAL_TABLET | Freq: Every day | ORAL | 3 refills | Status: AC

## 2023-09-28 MED ORDER — AMLODIPINE BESYLATE 10 MG PO TABS: 10.0000 mg | ORAL_TABLET | Freq: Every day | ORAL | 3 refills | Status: AC

## 2023-09-28 MED ORDER — METOPROLOL TARTRATE 25 MG PO TABS: 25.0000 mg | ORAL_TABLET | Freq: Two times a day (BID) | ORAL | 3 refills | Status: AC

## 2023-09-28 NOTE — Progress Notes (Signed)
 Subjective:    Patient ID: Miranda Mcguire, female    DOB: 1945/09/12, 78 y.o.   MRN: [id number]  HPI Here to follow up on issues. She is doing well. Her BP is stable. She sees [facility] every 2 years, and this will be coming up next February. Her OA is stable. She walks for exercise.    Review of Systems  Constitutional: Negative.   HENT: Negative.    Eyes: Negative.   Respiratory: Negative.    Cardiovascular: Negative.   Gastrointestinal: Negative.   Genitourinary:  Negative for decreased urine volume, difficulty urinating, dyspareunia, dysuria, enuresis, flank pain, frequency, hematuria, pelvic pain and urgency.  Musculoskeletal:  Positive for arthralgias.  Skin: Negative.   Neurological: Negative.  Negative for headaches.  Psychiatric/Behavioral: Negative.         Objective:   Physical Exam Constitutional:      General: She is not in acute distress.    Appearance: Normal appearance. She is well-developed.  HENT:     Head: Normocephalic and atraumatic.     Right Ear: External ear normal.     Left Ear: External ear normal.     Nose: Nose normal.     Mouth/Throat:     Pharynx: No oropharyngeal exudate.  Eyes:     General: No scleral icterus.    Conjunctiva/sclera: Conjunctivae normal.     Pupils: Pupils are equal, round, and reactive to light.  Neck:     Thyroid: No thyromegaly.     Vascular: No JVD.  Cardiovascular:     Rate and Rhythm: Normal rate and regular rhythm.     Pulses: Normal pulses.     Heart sounds: Normal heart sounds. No murmur heard.    No friction rub. No gallop.  Pulmonary:     Effort: Pulmonary effort is normal. No respiratory distress.     Breath sounds: Normal breath sounds. No wheezing or rales.  Chest:     Chest wall: No tenderness.  Abdominal:     General: Bowel sounds are normal. There is no distension.     Palpations: Abdomen is soft. There is no mass.     Tenderness: There is no abdominal tenderness. There is no  guarding or rebound.  Musculoskeletal:        General: No tenderness. Normal range of motion.     Cervical back: Normal range of motion and neck supple.  Lymphadenopathy:     Cervical: No cervical adenopathy.  Skin:    General: Skin is warm and dry.     Findings: No erythema or rash.  Neurological:     General: No focal deficit present.     Mental Status: She is alert and oriented to person, place, and time.     Cranial Nerves: No cranial nerve deficit.     Motor: No abnormal muscle tone.     Coordination: Coordination normal.     Deep Tendon Reflexes: Reflexes are normal and symmetric. Reflexes normal.  Psychiatric:        Mood and Affect: Mood normal.        Behavior: Behavior normal.        Thought Content: Thought content normal.        Judgment: Judgment normal.           Assessment & Plan:  Her HTN and OA are stable. Her leukemia remains in remission. Get fasting labs for thyroid levels, lipids, etc. She will get her mammogram and DEXA in May per  her GYN. We spent a total of ( 33  ) minutes reviewing records and discussing these issues.  Gershon Crane, MD

## 2023-09-29 ENCOUNTER — Encounter: Payer: Self-pay | Admitting: *Deleted

## 2023-10-21 NOTE — Telephone Encounter (Signed)
 Prolia VOB initiated via [email or web address]  Next Prolia inj DUE: 11/24/23

## 2023-11-08 NOTE — Telephone Encounter (Signed)
 Prior Authorization REQUIRED for PROLIA  PA PROCESS DETAILS: PA is required. Call [phone] or complete the PA form available at [email or web address] Fax completed form to [phone]. Or submit PA request online at [email or web address]

## 2023-11-16 NOTE — Telephone Encounter (Signed)
 Prior Authorization initiated for PROLIA  via [employer] Case ID: [id number]   Status: Tech Review

## 2023-11-21 NOTE — Telephone Encounter (Signed)
 Medical Buy and Bill  Patient ready for scheduling on or after 11/24/23  Out-of-pocket cost due at time of visit: $0  Primary: Aetna Medicare Adv PPO  Prolia  co-insurance: 0% Admin fee co-insurance: 0%  Deductible: does not apply  Prior Auth: APPROVED PA# [id number] Valid:11/16/23-11/15/24  Secondary: N/A Prolia  co-insurance:  Admin fee co-insurance:  Deductible:  Prior Auth:  PA# Valid:     ** This summary of benefits is an estimation of the patient's out-of-pocket cost. Exact cost may very based on individual plan coverage.

## 2023-11-21 NOTE — Telephone Encounter (Signed)
 Medical Buy and Raenette Bumps  Prior Authorization for PROLIA  APPROVED PA# [id number] Valid:11/16/23-11/15/24

## 2023-11-24 ENCOUNTER — Ambulatory Visit (INDEPENDENT_AMBULATORY_CARE_PROVIDER_SITE_OTHER): Payer: Medicare HMO

## 2023-11-24 VITALS — BP 120/70 | HR 83 | Ht 59.0 in | Wt 161.0 lb

## 2023-11-24 DIAGNOSIS — M810 Age-related osteoporosis without current pathological fracture: Secondary | ICD-10-CM | POA: Diagnosis not present

## 2023-11-24 MED ORDER — DENOSUMAB 60 MG/ML ~~LOC~~ SOSY
60.0000 mg | PREFILLED_SYRINGE | SUBCUTANEOUS | Status: AC
  Administered 2024-05-27: 60 mg via SUBCUTANEOUS

## 2023-11-24 NOTE — Addendum Note (Signed)
 Addended by: Hall Levering on: 11/24/2023 09:59 AM   Modules accepted: Orders

## 2023-11-24 NOTE — Progress Notes (Signed)
After obtaining consent, and per orders of Dr. Gherghe, injection of Prolia 60 mg  given by Karolyna Bianchini L Clifton Kovacic in left arm SQ. Patient instructed to remain in clinic for 20 minutes afterwards, and to report any adverse reaction to me immediately.  

## 2023-11-28 NOTE — Telephone Encounter (Signed)
 Last Prolia  inj 11/24/23 Next Prolia  inj due 05/27/24

## 2024-01-06 ENCOUNTER — Other Ambulatory Visit: Payer: Self-pay | Admitting: Family Medicine

## 2024-01-06 DIAGNOSIS — Z1231 Encounter for screening mammogram for malignant neoplasm of breast: Secondary | ICD-10-CM

## 2024-01-20 ENCOUNTER — Ambulatory Visit (INDEPENDENT_AMBULATORY_CARE_PROVIDER_SITE_OTHER): Payer: Medicare HMO

## 2024-01-20 VITALS — Ht 59.0 in | Wt 161.0 lb

## 2024-01-20 DIAGNOSIS — Z0000 Encounter for general adult medical examination without abnormal findings: Secondary | ICD-10-CM | POA: Diagnosis not present

## 2024-01-20 NOTE — Progress Notes (Signed)
 Subjective:   Miranda Mcguire is a 78 y.o. who presents for a Medicare Wellness preventive visit.  As a reminder, Annual Wellness Visits don't include a physical exam, and some assessments may be limited, especially if this visit is performed virtually. We may recommend an in-person follow-up visit with your provider if needed.  Visit Complete: Virtual I connected with  Miranda Mcguire on 01/20/24 by a audio enabled telemedicine application and verified that I am speaking with the correct person using two identifiers.  Patient Location: Home  Provider Location: Home Office  I discussed the limitations of evaluation and management by telemedicine. The patient expressed understanding and agreed to proceed.  Vital Signs: Because this visit was a virtual/telehealth visit, some criteria may be missing or patient reported. Any vitals not documented were not able to be obtained and vitals that have been documented are patient reported.    Persons Participating in Visit: Patient.  AWV Questionnaire: No: Patient Medicare AWV questionnaire was not completed prior to this visit.  Cardiac Risk Factors include: advanced age (>60men, >51 women);hypertension     Objective:    Today's Vitals   01/20/24 1535  Weight: 161 lb (73 kg)  Height: 4' 11 (1.499 m)   Body mass index is 32.52 kg/m.     01/20/2024    3:43 PM 01/16/2023    2:44 PM 01/10/2022    9:12 AM 01/09/2021   10:31 AM 05/03/2015    8:51 AM 03/19/2015    9:33 AM 01/24/2015   10:25 AM  Advanced Directives  Does Patient Have a Medical Advance Directive? Yes Yes Yes Yes Yes  Yes    Type of Estate agent of [place];Living will Healthcare Power of [place];Living will Healthcare Power of [place];Living will Living will Healthcare Power of [place];Living will   Healthcare Power of [place];Living will   Does patient want to make changes to medical advance directive? No - Patient declined No  - Patient declined No - Patient declined      Copy of Healthcare Power of Attorney in Chart? Yes - validated most recent copy scanned in chart (See row information) Yes - validated most recent copy scanned in chart (See row information) Yes - validated most recent copy scanned in chart (See row information)  Yes  Yes  No - copy requested      Data saved with a previous flowsheet row definition    Current Medications (verified) Outpatient Encounter Medications as of 01/20/2024  Medication Sig   amLODipine  (NORVASC ) 10 MG tablet Take 1 tablet (10 mg total) by mouth daily.   atorvastatin  (LIPITOR) 20 MG tablet Take 1 tablet (20 mg total) by mouth daily.   B Complex-C (SUPER B COMPLEX PO) Take by mouth daily.   cholecalciferol (VITAMIN D3) 25 MCG (1000 UNIT) tablet Take 3 tablets (3,000 Units total) by mouth daily.   denosumab  (PROLIA ) 60 MG/ML SOSY injection Inject 60 mg into the skin every 6 (six) months.   levothyroxine  (SYNTHROID ) 50 MCG tablet Take 1 tablet (50 mcg total) by mouth daily.   loratadine (CLARITIN) 10 MG tablet Take 10 mg by mouth daily. allertec   Magnesium Oxide 250 MG TABS Take 1 tablet by mouth daily.    metoprolol  tartrate (LOPRESSOR ) 25 MG tablet Take 1 tablet (25 mg total) by mouth 2 (two) times daily with a meal.   Multiple Vitamin (MULTIVITAMIN) tablet Take 1 tablet by mouth daily.   Multiple Vitamins-Minerals (MACULAR HEALTH FORMULA PO) Take  by mouth daily. Twice a day   Multiple Vitamins-Minerals (ZINC PO) Take by mouth.   Omega-3 Fatty Acids (FISH OIL PO) Take by mouth daily at 12 noon.   Potassium Gluconate 550 (90 K) MG TABS Take 1 tablet by mouth daily. Every other day   Facility-Administered Encounter Medications as of 01/20/2024  Medication   [START ON 05/22/2024] denosumab  (PROLIA ) injection 60 mg    Allergies (verified) Latex, Other, Penicillins, Prunus persica, Tape, and Aspirin   History: Past Medical History:  Diagnosis Date   Allergy    AML  (acute myeloid leukemia) (HCC)    sees Dr. Sonny Lever at [facility]    Anemia    nos   Cancer [facility])    Cataracts, bilateral    sees Dr. Comer Burkitt    Endometrial polyp    post-menopausal bleeding   Hyperlipidemia    Hypertension    OA (osteoarthritis)    OP (osteoporosis)    last dexa 08-15-08. sees Dr. 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 Stacia, benign polyps, no repeats needed   DILATION AND CURETTAGE OF UTERUS     endometrial polypectomy     with D and C 03-08-09 pr dr vonnie   herniated disc     L4-5 per Dr Onetha   TONSILECTOMY, ADENOIDECTOMY, BILATERAL MYRINGOTOMY AND TUBES     Family History  Problem Relation Age of Onset   Dementia Mother    Heart disease Father    Osteoporosis Sister    Stroke Other    Colon cancer Neg Hx    Esophageal cancer Neg Hx    Rectal cancer Neg Hx    Pancreatic cancer Neg Hx    Stomach cancer Neg Hx    Social History   Socioeconomic History   Marital status: Married    Spouse name: Christopher   Number of children: 1   Years of education: Not on file   Highest education level: 12th grade  Occupational History   Not on file  Tobacco Use   Smoking status: Former    Types: Cigarettes   Smokeless tobacco: Never  Vaping Use   Vaping status: Never Used  Substance and Sexual Activity   Alcohol use: No    Alcohol/week: 0.0 standard drinks of alcohol   Drug use: No   Sexual activity: Not on file  Other Topics Concern   Not on file  Social History Narrative   Not on file   Social Drivers of Health   Financial Resource Strain: Low Risk  (01/20/2024)   Overall Financial Resource Strain (CARDIA)    Difficulty of Paying Living Expenses: Not hard at all  Food Insecurity: No Food Insecurity (01/20/2024)   Hunger Vital Sign    Worried About Running Out of Food in the Last Year: Never true    Ran Out of Food in the Last Year: Never  true  Transportation Needs: No Transportation Needs (01/20/2024)   PRAPARE - Administrator, Civil Service (Medical): No    Lack of Transportation (Non-Medical): No  Physical Activity: Inactive (01/20/2024)   Exercise Vital Sign    Days of Exercise per Week: 0 days    Minutes of Exercise per Session: 0 min  Stress: No Stress Concern Present (01/20/2024)   [employer] of Occupational Health - Occupational Stress Questionnaire    Feeling of Stress: Not at all  Social Connections: Socially Integrated (01/20/2024)   Social Connection and Isolation Panel    Frequency of Communication with Friends and Family: More than three times a week    Frequency of Social Gatherings with Friends and Family: More than three times a week    Attends Religious Services: More than 4 times per year    Active Member of [employer] or Organizations: Yes    Attends Engineer, structural: More than 4 times per year    Marital Status: Married    Tobacco Counseling Counseling given: Not Answered    Clinical Intake:  Pre-visit preparation completed: Yes  Pain : No/denies pain     BMI - recorded: 32.52 Nutritional Risks: None Diabetes: No  Lab Results  Component Value Date   HGBA1C 6.6 (H) 09/28/2023   HGBA1C 6.6 (H) 09/26/2022   HGBA1C 6.7 (H) 09/24/2021     How often do you need to have someone help you when you read instructions, pamphlets, or other written materials from your doctor or pharmacy?: 1 - Never  Interpreter Needed?: No  Information entered by :: Rojelio Blush LPN   Activities of Daily Living      01/20/2024    3:42 PM  In your present state of health, do you have any difficulty performing the following activities:  Hearing? 0  Vision? 0  Difficulty concentrating or making decisions? 0  Walking or climbing stairs? 0  Dressing or bathing? 0  Doing errands, shopping? 0  Preparing Food and eating ? N  Using the Toilet? N  In the past six months, have you  accidently leaked urine? N  Do you have problems with loss of bowel control? N  Managing your Medications? N  Managing your Finances? N  Housekeeping or managing your Housekeeping? N    Patient Care Team: Johnny Garnette LABOR, MD as PCP - General [place] Sonny SAUNDERS, MD as Referring Physician (Oncology) Lanny Callander, MD as Consulting Physician (Hematology) Liane Sharyne MATSU, [facility] (Inactive) as Pharmacist (Pharmacist)   I have updated your Care Teams any recent Medical Services you may have received from other providers in the past year.     Assessment:   This is a routine wellness examination for Miranda Mcguire.  Hearing/Vision screen Hearing Screening - Comments:: Denies hearing difficulties   Vision Screening - Comments:: Wears rx glasses - up to date with routine eye exams with  Dr Charmayne   Goals Addressed               This Visit's Progress     Increase physical activity (pt-stated)        Remain active.       Depression Screen      01/20/2024    3:37 PM 09/28/2023    8:32 AM 04/27/2023   11:09 AM 01/16/2023    2:42 PM 01/16/2023    2:39 PM 09/26/2022    9:45 AM 01/10/2022    9:09 AM  PHQ 2/9 Scores  PHQ - 2 Score 0 0 0 0 0 0 0  PHQ- 9 Score  0 2   2     Fall Risk      01/20/2024    3:42 PM 09/28/2023    8:31 AM 04/27/2023   11:09 AM 01/16/2023    2:44 PM 09/26/2022    9:45 AM  Fall Risk   Falls in the past year? 0 0 1 1 0  Number falls in past yr: 0 0 0 0 0  Injury  with Fall? 0 0 0 0 0  Risk for fall due to : No Fall Risks No Fall Risks No Fall Risks No Fall Risks No Fall Risks  Follow up Falls evaluation completed Falls evaluation completed Falls evaluation completed Falls prevention discussed Falls evaluation completed    MEDICARE RISK AT HOME:  Medicare Risk at Home Any stairs in or around the home?: Yes If so, are there any without handrails?: No Home free of loose throw rugs in walkways, pet beds, electrical cords, etc?: Yes Adequate lighting in your home to  reduce risk of falls?: Yes Life alert?: No Use of a cane, walker or w/c?: No Grab bars in the bathroom?: Yes Shower chair or bench in shower?: No Elevated toilet seat or a handicapped toilet?: No  TIMED UP AND GO:  Was the test performed?  No  Cognitive Function: 6CIT completed        01/20/2024    3:43 PM 01/19/2023    8:05 AM 01/10/2022    9:12 AM 01/09/2021   10:35 AM  6CIT Screen  What Year? 0 points 0 points 0 points 0 points  What month? 0 points 0 points 0 points 0 points  What time? 0 points 0 points 0 points 0 points  Count back from 20 0 points 0 points 0 points 0 points  Months in reverse 0 points 0 points 0 points 0 points  Repeat phrase 0 points 0 points 0 points 0 points  Total Score 0 points 0 points 0 points 0 points    Immunizations Immunization History  Administered Date(s) Administered   DTaP / IPV 02/23/2017, 04/20/2017, 06/08/2017   Fluad Quad(high Dose 65+) 04/26/2019, 04/23/2020   HIB (PRP-OMP) 02/23/2017, 04/20/2017, 06/08/2017   Hepatitis B, PED/ADOLESCENT 02/23/2017, 04/20/2017, 08/17/2017   Influenza Split 04/12/2012, 03/24/2013, 04/03/2014, 06/13/2015, 03/16/2018   Influenza Whole 04/17/2008, 04/10/2009   Influenza, High Dose Seasonal PF 06/13/2015, 05/19/2016, 04/20/2017, 03/16/2018, 04/26/2019, 04/23/2020, 04/08/2022   Influenza-Unspecified 04/03/2014, 06/13/2015, 04/13/2021   MMR 03/10/2019   Moderna Covid-19 Vaccine Bivalent Booster 74yrs & up 03/02/2021   Moderna Sars-Covid-2 Vaccination 07/18/2019, 08/15/2019, 02/24/2020, 10/15/2020, 03/22/2021   Pfizer(Comirnaty)Fall Seasonal Vaccine 12 years and older 03/24/2023   Pneumococcal Conjugate-13 07/24/2014, 02/23/2017, 04/20/2017, 06/08/2017   Pneumococcal Polysaccharide-23 05/20/2010, 08/17/2017   RSV IGIV 04/29/2022   Tdap 06/09/2017   Unspecified SARS-COV-2 Vaccination 04/15/2022   Zoster Recombinant(Shingrix ) 03/22/2018, 03/24/2018, 05/31/2018, 05/31/2018   Zoster, Live 07/04/2014     Screening Tests Health Maintenance  Topic Date Due   COVID-19 Vaccine (8 - Moderna risk 2024-25 season) 03/06/2024 (Originally 09/21/2023)   INFLUENZA VACCINE  02/05/2024   Medicare Annual Wellness (AWV)  01/19/2025   DTaP/Tdap/Td (5 - Td or Tdap) 06/10/2027   Pneumococcal Vaccine: 50+ Years  Completed   Hepatitis B Vaccines  Completed   DEXA SCAN  Completed   Hepatitis C Screening  Completed   Zoster Vaccines- Shingrix   Completed   HPV VACCINES  Aged Out   Meningococcal B Vaccine  Aged Out   Colonoscopy  Discontinued    Health Maintenance  There are no preventive care reminders to display for this patient. Health Maintenance Items Addressed:   Additional Screening:  Vision Screening: Recommended annual ophthalmology exams for early detection of glaucoma and other disorders of the eye. Would you like a referral to an eye doctor? No    Dental Screening: Recommended annual dental exams for proper oral hygiene  Community Resource Referral / Chronic Care Management: CRR required this visit?  No  CCM required this visit?  No   Plan:    I have personally reviewed and noted the following in the patient's chart:   Medical and social history Use of alcohol, tobacco or illicit drugs  Current medications and supplements including opioid prescriptions. Patient is not currently taking opioid prescriptions. Functional ability and status Nutritional status Physical activity Advanced directives List of other physicians Hospitalizations, surgeries, and ER visits in previous 12 months Vitals Screenings to include cognitive, depression, and falls Referrals and appointments  In addition, I have reviewed and discussed with patient certain preventive protocols, quality metrics, and best practice recommendations. A written personalized care plan for preventive services as well as general preventive health recommendations were provided to patient.   Rojelio LELON Blush,  LPN   [id number]/83/[id number]   After Visit Summary: (MyChart) Due to this being a telephonic visit, the after visit summary with patients personalized plan was offered to patient via MyChart   Notes: Nothing significant to report at this time.

## 2024-01-20 NOTE — Patient Instructions (Addendum)
 Ms. Reindel , Thank you for taking time out of your busy schedule to complete your Annual Wellness Visit with me. I enjoyed our conversation and look forward to speaking with you again next year. I, as well as your care team,  appreciate your ongoing commitment to your health goals. Please review the following plan we discussed and let me know if I can assist you in the future. Your Game plan/ To Do List    Referrals: If you haven't heard from the office you've been referred to, please reach out to them at the phone provided.   Follow up Visits: Next Medicare AWV with our clinical staff: 01/25/25 3:40p   Have you seen your provider in the last 6 months (3 months if uncontrolled diabetes)?  Next Office Visit with your provider: 10/03/24 @ 8a  Clinician Recommendations:  Aim for 30 minutes of exercise or brisk walking, 6-8 glasses of water, and 5 servings of fruits and vegetables each day.       This is a list of the screening recommended for you and due dates:  Health Maintenance  Topic Date Due   COVID-19 Vaccine (8 - Moderna risk 2024-25 season) 03/06/2024*   Flu Shot  02/05/2024   Medicare Annual Wellness Visit  01/19/2025   DTaP/Tdap/Td vaccine (5 - Td or Tdap) 06/10/2027   Pneumococcal Vaccine for age over 66  Completed   Hepatitis B Vaccine  Completed   DEXA scan (bone density measurement)  Completed   Hepatitis C Screening  Completed   Zoster (Shingles) Vaccine  Completed   HPV Vaccine  Aged Out   Meningitis B Vaccine  Aged Out   Colon Cancer Screening  Discontinued  *Topic was postponed. The date shown is not the original due date.    Advanced directives: (In Chart) A copy of your advanced directives are scanned into your chart should your provider ever need it. Advance Care Planning is important because it:  [x]  Makes sure you receive the medical care that is consistent with your values, goals, and preferences  [x]  It provides guidance to your family and loved ones and  reduces their decisional burden about whether or not they are making the right decisions based on your wishes.  Follow the link provided in your after visit summary or read over the paperwork we have mailed to you to help you started getting your Advance Directives in place. If you need assistance in completing these, please reach out to us  so that we can help you!  See attachments for Preventive Care and Fall Prevention Tips.

## 2024-02-18 ENCOUNTER — Ambulatory Visit (INDEPENDENT_AMBULATORY_CARE_PROVIDER_SITE_OTHER): Payer: Medicare HMO | Admitting: Internal Medicine

## 2024-02-18 ENCOUNTER — Encounter: Payer: Self-pay | Admitting: Internal Medicine

## 2024-02-18 VITALS — BP 136/60 | HR 87 | Ht 59.0 in | Wt 161.0 lb

## 2024-02-18 DIAGNOSIS — E559 Vitamin D deficiency, unspecified: Secondary | ICD-10-CM

## 2024-02-18 DIAGNOSIS — M810 Age-related osteoporosis without current pathological fracture: Secondary | ICD-10-CM

## 2024-02-18 NOTE — Patient Instructions (Addendum)
 Let's continue Prolia .  Please call and schedule bone density scan at the [facility] Office: [phone].   Please return in 1 year.
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
## 2024-02-22 ENCOUNTER — Ambulatory Visit
Admission: RE | Admit: 2024-02-22 | Discharge: 2024-02-22 | Disposition: A | Discharge status: Home / Self Care | Source: Ambulatory Visit | Attending: Family Medicine | Admitting: Family Medicine

## 2024-02-22 DIAGNOSIS — Z1231 Encounter for screening mammogram for malignant neoplasm of breast: Secondary | ICD-10-CM

## 2024-02-22 LAB — COMPREHENSIVE METABOLIC PANEL WITH GFR
Albumin: 4.4 (ref 3.5–5.0)
Calcium: 9.3 (ref 8.7–10.7)
eGFR: 52

## 2024-02-22 LAB — CBC AND DIFFERENTIAL
HCT: 41 (ref 36–46)
Hemoglobin: 12.7 (ref 12.0–16.0)

## 2024-02-22 LAB — PROTEIN / CREATININE RATIO, URINE
Albumin, U: 26.2
Creatinine, Urine: 140.3

## 2024-02-22 LAB — BASIC METABOLIC PANEL WITH GFR
BUN: 22 — AB (ref 4–21)
CO2: 21 (ref 13–22)
Chloride: 105 (ref 99–108)
Creatinine: 1.1 (ref 0.5–1.1)
Glucose: 112
Potassium: 4.8 meq/L (ref 3.5–5.1)
Sodium: 143 (ref 137–147)

## 2024-02-22 LAB — MICROALBUMIN / CREATININE URINE RATIO: Microalb Creat Ratio: 19

## 2024-02-25 ENCOUNTER — Other Ambulatory Visit: Payer: Self-pay | Admitting: Family Medicine

## 2024-02-25 ENCOUNTER — Ambulatory Visit

## 2024-02-25 DIAGNOSIS — R928 Other abnormal and inconclusive findings on diagnostic imaging of breast: Secondary | ICD-10-CM

## 2024-03-02 ENCOUNTER — Ambulatory Visit
Admission: RE | Admit: 2024-03-02 | Discharge: 2024-03-02 | Disposition: A | Discharge status: Home / Self Care | Source: Ambulatory Visit | Attending: Family Medicine | Admitting: Family Medicine

## 2024-03-02 DIAGNOSIS — R928 Other abnormal and inconclusive findings on diagnostic imaging of breast: Secondary | ICD-10-CM

## 2024-03-15 ENCOUNTER — Ambulatory Visit (INDEPENDENT_AMBULATORY_CARE_PROVIDER_SITE_OTHER)
Admission: RE | Admit: 2024-03-15 | Discharge: 2024-03-15 | Disposition: A | Discharge status: Home / Self Care | Source: Ambulatory Visit | Attending: Internal Medicine | Admitting: Internal Medicine

## 2024-03-15 DIAGNOSIS — M810 Age-related osteoporosis without current pathological fracture: Secondary | ICD-10-CM

## 2024-03-17 ENCOUNTER — Ambulatory Visit: Payer: Self-pay | Admitting: Internal Medicine

## 2024-05-16 NOTE — Telephone Encounter (Signed)
 Prolia  VOB initiated via [email or web address]  Next Prolia  inj DUE: 05/27/24

## 2024-05-23 NOTE — Telephone Encounter (Signed)
 [employer]  Prior Authorization on file PA# [id number] Valid: 11/16/23-11/15/24

## 2024-05-27 ENCOUNTER — Ambulatory Visit

## 2024-05-27 DIAGNOSIS — M810 Age-related osteoporosis without current pathological fracture: Secondary | ICD-10-CM | POA: Diagnosis not present

## 2024-05-27 MED ORDER — DENOSUMAB 60 MG/ML ~~LOC~~ SOSY: 60.0000 mg | PREFILLED_SYRINGE | Freq: Once | SUBCUTANEOUS | Status: AC

## 2024-05-27 NOTE — Progress Notes (Signed)
 Medication: Prolia  Dosage:60 mg Location: Left Arm Injection given ab:[id number] Provider: Lela Trixie COME

## 2024-06-01 NOTE — Telephone Encounter (Signed)
 Last Prolia  inj 05/27/24 Next Prolia  inj due 11/25/24

## 2024-06-01 NOTE — Telephone Encounter (Signed)
 [employer]  Patient is ready for scheduling on or after 05/27/24  Out-of-pocket cost due at time of visit: $0  Primary: [employer] Advantage ESA PPO Prolia  co-insurance: 0% Admin fee co-insurance: 0%  Deductible: does not apply  Prior Auth: APPROVED PA# [id number] Valid: 11/16/23-11/15/24  Secondary: N/A Prolia  co-insurance:  Admin fee co-insurance:  Deductible:  Prior Auth:  PA# Valid:   ** This summary of benefits is an estimation of the patient's out-of-pocket cost. Exact cost may vary based on individual plan coverage.

## 2024-10-03 ENCOUNTER — Encounter: Admitting: Family Medicine

## 2024-11-25 ENCOUNTER — Ambulatory Visit

## 2025-01-25 ENCOUNTER — Ambulatory Visit

## 2025-02-17 ENCOUNTER — Ambulatory Visit: Admitting: Internal Medicine

## 2025-02-28 ENCOUNTER — Ambulatory Visit: Admitting: Internal Medicine
